# Patient Record
Sex: Female | Born: 1975 | Race: Black or African American | Hispanic: No | Smoking: Never smoker
Health system: Southern US, Community
[De-identification: ages and names within clinical notes are randomized; demographics above are authoritative.]

## PROBLEM LIST (undated history)

## (undated) DIAGNOSIS — K219 Gastro-esophageal reflux disease without esophagitis: Secondary | ICD-10-CM

## (undated) DIAGNOSIS — I10 Essential (primary) hypertension: Secondary | ICD-10-CM

## (undated) DIAGNOSIS — I82409 Acute embolism and thrombosis of unspecified deep veins of unspecified lower extremity: Secondary | ICD-10-CM

## (undated) DIAGNOSIS — E785 Hyperlipidemia, unspecified: Secondary | ICD-10-CM

## (undated) DIAGNOSIS — J45909 Unspecified asthma, uncomplicated: Secondary | ICD-10-CM

## (undated) DIAGNOSIS — M109 Gout, unspecified: Secondary | ICD-10-CM

## (undated) HISTORY — PX: TUBAL LIGATION: SHX77

## (undated) HISTORY — PX: BREAST SURGERY: SHX581

## (undated) HISTORY — PX: ABDOMINAL HYSTERECTOMY: SHX81

## (undated) HISTORY — PX: CHOLECYSTECTOMY: SHX55

## (undated) HISTORY — PX: KNEE SURGERY: SHX244

---

## 2011-05-11 ENCOUNTER — Emergency Department (HOSPITAL_BASED_OUTPATIENT_CLINIC_OR_DEPARTMENT_OTHER)
Admission: EM | Admit: 2011-05-11 | Discharge: 2011-05-11 | Disposition: A | Discharge status: Home / Self Care | Payer: Medicaid Other | Attending: Emergency Medicine | Admitting: Emergency Medicine

## 2011-05-11 ENCOUNTER — Encounter: Payer: Self-pay | Admitting: *Deleted

## 2011-05-11 ENCOUNTER — Emergency Department (INDEPENDENT_AMBULATORY_CARE_PROVIDER_SITE_OTHER): Payer: Medicaid Other

## 2011-05-11 DIAGNOSIS — R059 Cough, unspecified: Secondary | ICD-10-CM | POA: Insufficient documentation

## 2011-05-11 DIAGNOSIS — R05 Cough: Secondary | ICD-10-CM | POA: Insufficient documentation

## 2011-05-11 DIAGNOSIS — R079 Chest pain, unspecified: Secondary | ICD-10-CM | POA: Insufficient documentation

## 2011-05-11 DIAGNOSIS — I10 Essential (primary) hypertension: Secondary | ICD-10-CM | POA: Insufficient documentation

## 2011-05-11 DIAGNOSIS — J40 Bronchitis, not specified as acute or chronic: Secondary | ICD-10-CM | POA: Insufficient documentation

## 2011-05-11 HISTORY — DX: Essential (primary) hypertension: I10

## 2011-05-11 MED ORDER — HYDROCOD POLST-CHLORPHEN POLST 10-8 MG/5ML PO LQCR: 5.0000 mL | Freq: Two times a day (BID) | ORAL | Status: DC

## 2011-05-11 MED ORDER — HYDROCOD POLST-CHLORPHEN POLST 10-8 MG/5ML PO LQCR
5.0000 mL | Freq: Once | ORAL | Status: AC
  Administered 2011-05-11: 5 mL via ORAL
  Filled 2011-05-11: qty 5

## 2011-05-11 NOTE — ED Provider Notes (Addendum)
History     CSN: [id number] Arrival date & time: 05/11/2011  1:31 AM  Chief Complaint  Patient presents with  . Cough   HPI Complains of cough for one week cough is nonproductive no shortness of breath no fever developed chest pain left anterior worse with coughing yesterday . Treated with ibuprofen without relief  Past Medical History  Diagnosis Date  . Hypertension     Past Surgical History  Procedure Date  . Breast surgery   . Tubal ligation   . Cholecystectomy     History reviewed. No pertinent family history.  History  Substance Use Topics  . Smoking status: Never Smoker   . Smokeless tobacco: Not on file  . Alcohol Use: No    OB History    Grav Para Term Preterm Abortions TAB SAB Ect Mult Living                  Review of Systems  Constitutional: Negative.   HENT: Negative.   Respiratory: Positive for cough.   Cardiovascular: Positive for chest pain.       Chest pain with cough only  Gastrointestinal: Negative.   Musculoskeletal: Negative.   Skin: Negative.   Neurological: Negative.   Hematological: Negative.   Psychiatric/Behavioral: Negative.     Physical Exam  BP 125/91  Pulse 90  Temp(Src) 98.8 F (37.1 C) (Oral)  Resp 16  Wt 205 lb (92.987 kg)  LMP 05/10/2011  Physical Exam  Constitutional: She appears well-developed and well-nourished.  HENT:  Head: Normocephalic and atraumatic.  Eyes: Conjunctivae are normal. Pupils are equal, round, and reactive to light.  Neck: Neck supple. No tracheal deviation present. No thyromegaly present.  Cardiovascular: Normal rate and regular rhythm.   No murmur heard. Pulmonary/Chest: Effort normal and breath sounds normal.       Left chest wall tender, reproducing pain exactly  Abdominal: Soft. Bowel sounds are normal. She exhibits no distension. There is no tenderness.  Musculoskeletal: Normal range of motion. She exhibits no edema and no tenderness.  Neurological: She is alert. Coordination normal.    Skin: Skin is warm and dry. No rash noted.  Psychiatric: She has a normal mood and affect.    ED Course  Procedures  MDM Cough allergic versus viral no pneumonia on chest x-ray plan prescription Tussionex      Doug Sou, MD 05/11/11 [id number]  Doug Sou, MD 05/11/11 0400

## 2011-05-11 NOTE — ED Notes (Signed)
Pt c/o non pro cough x 1 week , with Shoulder pain and back pain with movt and cough

## 2015-12-09 ENCOUNTER — Encounter (HOSPITAL_BASED_OUTPATIENT_CLINIC_OR_DEPARTMENT_OTHER): Payer: Self-pay | Admitting: Emergency Medicine

## 2015-12-09 ENCOUNTER — Emergency Department (HOSPITAL_BASED_OUTPATIENT_CLINIC_OR_DEPARTMENT_OTHER)
Admission: EM | Admit: 2015-12-09 | Discharge: 2015-12-09 | Disposition: A | Discharge status: Home / Self Care | Payer: Managed Care, Other (non HMO) | Attending: Emergency Medicine | Admitting: Emergency Medicine

## 2015-12-09 DIAGNOSIS — Z79899 Other long term (current) drug therapy: Secondary | ICD-10-CM | POA: Diagnosis not present

## 2015-12-09 DIAGNOSIS — R51 Headache: Secondary | ICD-10-CM | POA: Diagnosis not present

## 2015-12-09 DIAGNOSIS — H9201 Otalgia, right ear: Secondary | ICD-10-CM | POA: Insufficient documentation

## 2015-12-09 DIAGNOSIS — R110 Nausea: Secondary | ICD-10-CM | POA: Insufficient documentation

## 2015-12-09 DIAGNOSIS — R197 Diarrhea, unspecified: Secondary | ICD-10-CM | POA: Diagnosis present

## 2015-12-09 DIAGNOSIS — Z7951 Long term (current) use of inhaled steroids: Secondary | ICD-10-CM | POA: Insufficient documentation

## 2015-12-09 DIAGNOSIS — I10 Essential (primary) hypertension: Secondary | ICD-10-CM | POA: Insufficient documentation

## 2015-12-09 MED ORDER — LOPERAMIDE HCL 2 MG PO CAPS: 2.0000 mg | ORAL_CAPSULE | Freq: Four times a day (QID) | ORAL | Status: DC | PRN

## 2015-12-09 NOTE — ED Provider Notes (Signed)
CSN: [id number]     Arrival date & time 12/09/15  1906 History  By signing my name below, I, Terrance Branch, attest that this documentation has been prepared under the direction and in the presence of Arby BarretteMarcy Yassir Enis, MD. Electronically Signed: Evon Slackerrance Branch, ED Scribe. 12/09/2015. 10:26 PM.      Chief Complaint  Patient presents with  . Diarrhea   The history is provided by the patient. No language interpreter was used.   HPI Comments: Marylene LandJoyce Bortner is a 40 y.o. female who presents to the Emergency Department complaining of diarrhea x11 onset 2 days prior. Pt also complain of nausea, HA and right ear pain. Pt does report hx of constipation. Pt states she has tried pepto bismol with no relief. Denies fever, abdominal pain, vomiting, hematuria, dysuria, light headedness or dizziness. Pt does report sick contacts at work. Pt denies recent antibiotic use. She denies recent healthcare exposure.   Past Medical History  Diagnosis Date  . Hypertension    Past Surgical History  Procedure Laterality Date  . Breast surgery    . Tubal ligation    . Cholecystectomy     History reviewed. No pertinent family history. Social History  Substance Use Topics  . Smoking status: Never Smoker   . Smokeless tobacco: None  . Alcohol Use: Yes     Comment: occ   OB History    No data available     Review of Systems  Constitutional: Negative for fever.  HENT: Positive for ear pain.   Gastrointestinal: Positive for nausea and diarrhea. Negative for vomiting and abdominal pain.  Genitourinary: Negative for dysuria and hematuria.  Neurological: Positive for headaches. Negative for dizziness and light-headedness.  All other systems reviewed and are negative.     Allergies  Review of patient's allergies indicates no known allergies.  Home Medications   Prior to Admission medications   Medication Sig Start Date End Date Taking? Authorizing Provider  albuterol (PROVENTIL HFA;VENTOLIN HFA) 108 (90  Base) MCG/ACT inhaler Inhale 2 puffs into the lungs every 6 (six) hours as needed for wheezing or shortness of breath.   Yes Historical Provider, MD  beclomethasone (QVAR) 40 MCG/ACT inhaler Inhale into the lungs 2 (two) times daily.   Yes Historical Provider, MD  cetirizine (ZYRTEC) 10 MG tablet Take 10 mg by mouth daily.   Yes Historical Provider, MD  chlorpheniramine-HYDROcodone (TUSSIONEX PENNKINETIC ER) 10-8 MG/5ML LQCR Take 5 mLs by mouth every 12 (twelve) hours. 05/11/11   Doug SouSam Jacubowitz, MD  loperamide (IMODIUM) 2 MG capsule Take 1 capsule (2 mg total) by mouth 4 (four) times daily as needed for diarrhea or loose stools. 12/09/15   Arby BarretteMarcy Lulamae Skorupski, MD  triamterene-hydrochlorothiazide (DYAZIDE) 37.5-25 MG per capsule Take 1 capsule by mouth every morning.      Historical Provider, MD   BP 135/90 mmHg  Pulse 82  Temp(Src) 98.4 F (36.9 C) (Oral)  Resp 18  Ht 5\' 6"  (1.676 m)  Wt 206 lb (93.441 kg)  BMI 33.27 kg/m2  SpO2 100%  LMP 11/15/2015   Physical Exam  Constitutional: She is oriented to person, place, and time. She appears well-developed and well-nourished. No distress.  HENT:  Head: Normocephalic and atraumatic.  Eyes: Conjunctivae and EOM are normal.  Neck: Neck supple. No tracheal deviation present.  Cardiovascular: Normal rate, regular rhythm and normal heart sounds.  Exam reveals no gallop and no friction rub.   No murmur heard. Pulmonary/Chest: Effort normal and breath sounds normal. No respiratory distress. She  has no wheezes. She has no rales.  Abdominal: Soft. She exhibits no distension. There is no tenderness. There is no rebound.  Musculoskeletal: Normal range of motion. She exhibits no edema or tenderness.  Neurological: She is alert and oriented to person, place, and time. She exhibits normal muscle tone. Coordination normal.  Skin: Skin is warm and dry.  Psychiatric: She has a normal mood and affect. Her behavior is normal.  Nursing note and vitals  reviewed.   ED Course  Procedures (including critical care time) DIAGNOSTIC STUDIES: Oxygen Saturation is 100% on RA, normal by my interpretation.    COORDINATION OF CARE: 10:26 PM-Discussed treatment plan with pt at bedside and pt agreed to plan.     Labs Review Labs Reviewed  URINALYSIS, ROUTINE W REFLEX MICROSCOPIC (NOT AT [facility])  PREGNANCY, URINE    Imaging Review No results found.    EKG Interpretation None      MDM   Final diagnoses:  Diarrhea, unspecified type   Patient has had one day of uncomplicated diarrheal illness. No fever no abdominal pain. No C. difficile risk factors. Vital signs are stable. Patient is counseled to use Imodium as needed. She is counseled on signs and symptoms for which return. At this time she is well in appearance and stable for symptomatic treatment of what is most likely a viral diarrheal illness. At this time, no diagnostic studies indicated.     Arby Barrette, MD 12/09/15 2239

## 2015-12-09 NOTE — ED Notes (Signed)
Per MD: discontinue all lab orders -- pt to be discharged.

## 2015-12-09 NOTE — Discharge Instructions (Signed)

## 2015-12-09 NOTE — ED Notes (Signed)
Patient reports that she has had nausea and diarrhea for 2 days. Reports that she is now having an ear ache

## 2016-03-09 ENCOUNTER — Emergency Department (HOSPITAL_BASED_OUTPATIENT_CLINIC_OR_DEPARTMENT_OTHER)
Admission: EM | Admit: 2016-03-09 | Discharge: 2016-03-10 | Disposition: A | Discharge status: Home / Self Care | Payer: Managed Care, Other (non HMO) | Attending: Emergency Medicine | Admitting: Emergency Medicine

## 2016-03-09 ENCOUNTER — Encounter (HOSPITAL_BASED_OUTPATIENT_CLINIC_OR_DEPARTMENT_OTHER): Payer: Self-pay | Admitting: *Deleted

## 2016-03-09 DIAGNOSIS — Z79899 Other long term (current) drug therapy: Secondary | ICD-10-CM | POA: Diagnosis not present

## 2016-03-09 DIAGNOSIS — I10 Essential (primary) hypertension: Secondary | ICD-10-CM | POA: Diagnosis not present

## 2016-03-09 DIAGNOSIS — R509 Fever, unspecified: Secondary | ICD-10-CM | POA: Diagnosis present

## 2016-03-09 DIAGNOSIS — J029 Acute pharyngitis, unspecified: Secondary | ICD-10-CM | POA: Diagnosis not present

## 2016-03-09 LAB — RAPID STREP SCREEN (MED CTR MEBANE ONLY): Streptococcus, Group A Screen (Direct): NEGATIVE

## 2016-03-09 MED ORDER — DEXAMETHASONE SODIUM PHOSPHATE 10 MG/ML IJ SOLN
10.0000 mg | Freq: Once | INTRAMUSCULAR | Status: AC
  Administered 2016-03-09: 10 mg via INTRAMUSCULAR
  Filled 2016-03-09: qty 1

## 2016-03-09 NOTE — ED Notes (Signed)
Sore throat, HA and ear pain x 3 days.  Fever.

## 2016-03-09 NOTE — ED Provider Notes (Signed)
CSN: [id number]     Arrival date & time 03/09/16  2217 History   By signing my name below, I, Terrance Branch, attest that this documentation has been prepared under the direction and in the presence of Shon Batonourtney F Horton, MD. Electronically Signed: Evon Slackerrance Branch, ED Scribe. 03/09/2016. 11:40 PM.    Chief Complaint  Patient presents with  . URI    The history is provided by the patient. No language interpreter was used.   HPI Comments: Veronica Odom is a 40 y.o. female who presents to the Emergency Department complaining of URI symptoms onset 3 days prior. Pt states she has had a fever (max temp 102.1 PTA yesterday )and states that her sore throat is worse when swallowing.  Pt doesn't report any medications PTA. Pt denies SOB or other related symptoms. Reports headache and ear pain. Denies neck stiffness. Reports that she is able to swallow but it hurts with swallowing.   Past Medical History  Diagnosis Date  . Hypertension    Past Surgical History  Procedure Laterality Date  . Breast surgery    . Tubal ligation    . Cholecystectomy    . Knee surgery     History reviewed. No pertinent family history. Social History  Substance Use Topics  . Smoking status: Never Smoker   . Smokeless tobacco: None  . Alcohol Use: Yes     Comment: occ   OB History    No data available     Review of Systems  Constitutional: Positive for fever.  HENT: Positive for congestion, ear pain and sore throat.   Respiratory: Negative for shortness of breath.   Neurological: Positive for headaches.     Allergies  Ivp dye; Latex; and Penicillins  Home Medications   Prior to Admission medications   Medication Sig Start Date End Date Taking? Authorizing Provider  amLODipine (NORVASC) 10 MG tablet Take 10 mg by mouth daily.   Yes Historical Provider, MD  clindamycin (CLEOCIN) 150 MG capsule Take 2 capsules (300 mg total) by mouth 3 (three) times daily. 03/10/16   Shon Batonourtney F Horton, MD   BP 146/106  mmHg  Pulse 111  Temp(Src) 98.7 F (37.1 C) (Oral)  Resp 18  Ht 5\' 6"  (1.676 m)  Wt 190 lb (86.183 kg)  BMI 30.68 kg/m2  SpO2 99%  LMP 03/08/2016   Physical Exam  Constitutional: She is oriented to person, place, and time. She appears well-developed and well-nourished. No distress.  HENT:  Head: Normocephalic and atraumatic.  Mouth/Throat: Oropharyngeal exudate present.  Bilateral TMs clear, bilateral symmetric tonsillar enlargement with tonsillar exudate noted, uvula midline  Neck: Neck supple.  Cardiovascular: Normal rate, regular rhythm and normal heart sounds.   Pulmonary/Chest: Effort normal. No respiratory distress. She has no wheezes.  Abdominal: Soft. There is no tenderness.  Lymphadenopathy:    She has cervical adenopathy.  Neurological: She is alert and oriented to person, place, and time.  Skin: Skin is warm and dry.  Psychiatric: She has a normal mood and affect.  Nursing note and vitals reviewed.   ED Course  Procedures (including critical care time) DIAGNOSTIC STUDIES: Oxygen Saturation is 99% on RA, normal by my interpretation.    COORDINATION OF CARE: 11:41 PM-Discussed treatment plan with pt at bedside and pt agreed to plan.     Labs Review Labs Reviewed  RAPID STREP SCREEN (NOT AT [facility])  CULTURE, GROUP A STREP [facility])    Imaging Review No results found.    EKG Interpretation  None      MDM   Final diagnoses:  Pharyngitis    Patient presents with fever, sore throat, headache. Nontoxic on exam. Afebrile here. She meets 3 out of 4 [facility] criteria.  While strep screen here is negative, will elect to treat given patient's symptoms. She is otherwise well-appearing. Supportive measures at home including ibuprofen. She was given one dose of Decadron given tonsillar swelling. This is symmetric and there is no indication at this time for deep space infection.  After history, exam, and medical workup I feel the patient has been appropriately  medically screened and is safe for discharge home. Pertinent diagnoses were discussed with the patient. Patient was given return precautions.   I personally performed the services described in this documentation, which was scribed in my presence. The recorded information has been reviewed and is accurate.      Shon Baton, MD 03/10/16 [phone]

## 2016-03-10 MED ORDER — CLINDAMYCIN HCL 150 MG PO CAPS: 300.0000 mg | ORAL_CAPSULE | Freq: Three times a day (TID) | ORAL | Status: DC

## 2016-03-10 NOTE — Discharge Instructions (Signed)

## 2016-03-12 LAB — CULTURE, GROUP A STREP (THRC)

## 2016-11-24 ENCOUNTER — Emergency Department (HOSPITAL_BASED_OUTPATIENT_CLINIC_OR_DEPARTMENT_OTHER)
Admission: EM | Admit: 2016-11-24 | Discharge: 2016-11-24 | Disposition: A | Discharge status: Home / Self Care | Payer: Managed Care, Other (non HMO) | Attending: Emergency Medicine | Admitting: Emergency Medicine

## 2016-11-24 ENCOUNTER — Emergency Department (HOSPITAL_BASED_OUTPATIENT_CLINIC_OR_DEPARTMENT_OTHER): Payer: Managed Care, Other (non HMO)

## 2016-11-24 ENCOUNTER — Encounter (HOSPITAL_BASED_OUTPATIENT_CLINIC_OR_DEPARTMENT_OTHER): Payer: Self-pay | Admitting: Emergency Medicine

## 2016-11-24 DIAGNOSIS — R51 Headache: Secondary | ICD-10-CM | POA: Diagnosis not present

## 2016-11-24 DIAGNOSIS — I10 Essential (primary) hypertension: Secondary | ICD-10-CM | POA: Insufficient documentation

## 2016-11-24 DIAGNOSIS — R0981 Nasal congestion: Secondary | ICD-10-CM | POA: Diagnosis not present

## 2016-11-24 DIAGNOSIS — J029 Acute pharyngitis, unspecified: Secondary | ICD-10-CM | POA: Insufficient documentation

## 2016-11-24 DIAGNOSIS — R05 Cough: Secondary | ICD-10-CM | POA: Insufficient documentation

## 2016-11-24 DIAGNOSIS — J3489 Other specified disorders of nose and nasal sinuses: Secondary | ICD-10-CM | POA: Diagnosis not present

## 2016-11-24 DIAGNOSIS — R110 Nausea: Secondary | ICD-10-CM | POA: Diagnosis not present

## 2016-11-24 DIAGNOSIS — R509 Fever, unspecified: Secondary | ICD-10-CM | POA: Insufficient documentation

## 2016-11-24 DIAGNOSIS — M791 Myalgia: Secondary | ICD-10-CM | POA: Diagnosis not present

## 2016-11-24 DIAGNOSIS — J45909 Unspecified asthma, uncomplicated: Secondary | ICD-10-CM | POA: Diagnosis not present

## 2016-11-24 DIAGNOSIS — J111 Influenza due to unidentified influenza virus with other respiratory manifestations: Secondary | ICD-10-CM

## 2016-11-24 DIAGNOSIS — R69 Illness, unspecified: Secondary | ICD-10-CM

## 2016-11-24 HISTORY — DX: Unspecified asthma, uncomplicated: J45.909

## 2016-11-24 MED ORDER — BENZONATATE 100 MG PO CAPS
100.0000 mg | ORAL_CAPSULE | Freq: Once | ORAL | Status: AC
  Administered 2016-11-24: 100 mg via ORAL
  Filled 2016-11-24: qty 1

## 2016-11-24 MED ORDER — PREDNISONE 50 MG PO TABS
60.0000 mg | ORAL_TABLET | Freq: Once | ORAL | Status: AC
  Administered 2016-11-24: 60 mg via ORAL
  Filled 2016-11-24: qty 1

## 2016-11-24 MED ORDER — PREDNISONE 20 MG PO TABS
40.0000 mg | ORAL_TABLET | Freq: Every day | ORAL | 0 refills | Status: DC
Start: 2016-11-24 — End: 2017-01-08

## 2016-11-24 MED ORDER — IPRATROPIUM-ALBUTEROL 0.5-2.5 (3) MG/3ML IN SOLN
3.0000 mL | Freq: Once | RESPIRATORY_TRACT | Status: AC
  Administered 2016-11-24: 3 mL via RESPIRATORY_TRACT
  Filled 2016-11-24: qty 3

## 2016-11-24 MED ORDER — GUAIFENESIN-CODEINE 100-10 MG/5ML PO SOLN: 5.0000 mL | Freq: Three times a day (TID) | ORAL | 0 refills | Status: DC | PRN

## 2016-11-24 MED ORDER — ALBUTEROL SULFATE (2.5 MG/3ML) 0.083% IN NEBU
2.5000 mg | INHALATION_SOLUTION | Freq: Once | RESPIRATORY_TRACT | Status: AC
  Administered 2016-11-24: 2.5 mg via RESPIRATORY_TRACT
  Filled 2016-11-24: qty 3

## 2016-11-24 NOTE — ED Notes (Signed)
RT steve assessing pt in triage.

## 2016-11-24 NOTE — ED Provider Notes (Signed)
[facility]   CSN: [id number] Arrival date & time: 11/24/16  2031  By signing my name below, I, Veronica Odom, attest that this documentation has been prepared under the direction and in the presence of Wilburn Mylar, PA-C Electronically Signed: Soijett Odom, ED Scribe. 11/24/16. 10:03 PM.  History   Chief Complaint Chief Complaint  Patient presents with  . Cough    HPI Veronica Odom is a 41 y.o. female with a PMHx of asthma, HTN, who presents to the Emergency Department complaining of productive cough x yellow sputum onset 3-4 days ago. Pt reports associated wheezing, nasal congestion, rhinorrhea, subjective fever, chills, sore throat, appetite change, HA, generalized body aches, chest wall tenderness due to cough, and nausea. Pt has tried promethazine, tylenol, ginger tea, and cough drops with no relief of her symptoms. Pt notes that she hasn't obtained her flu vaccination this past year. She reports that she has sick contacts at her place of employment. She denies vomiting, sob, diarrhea, leg swelling, calf pain, and any other symptoms. Pt denies history of dvt,  recent travel, immobilization, surgery, estrogen use, or birth control use. Pt states that she has had a tubal ligation.   The history is provided by the patient. No language interpreter was used.    Past Medical History:  Diagnosis Date  . Asthma   . Hypertension     There are no active problems to display for this patient.   Past Surgical History:  Procedure Laterality Date  . BREAST SURGERY    . CHOLECYSTECTOMY    . KNEE SURGERY    . TUBAL LIGATION      OB History    No data available       Home Medications    Prior to Admission medications   Medication Sig Start Date End Date Taking? Authorizing Provider  amLODipine (NORVASC) 10 MG tablet Take 10 mg by mouth daily.    Historical Provider, MD  clindamycin (CLEOCIN) 150 MG capsule Take 2 capsules (300 mg total) by mouth  3 (three) times daily. 03/10/16   Shon Baton, MD    Family History History reviewed. No pertinent family history.  Social History Social History  Substance Use Topics  . Smoking status: Never Smoker  . Smokeless tobacco: Not on file  . Alcohol use Yes     Comment: occ     Allergies   Ivp dye [iodinated diagnostic agents]; Latex; and Penicillins   Review of Systems Review of Systems  Constitutional: Positive for appetite change, chills and fever (subjective).  HENT: Positive for congestion, rhinorrhea and sore throat.   Respiratory: Positive for cough and wheezing.        +chest wall tenderness due to cough  Cardiovascular: Negative for leg swelling.  Gastrointestinal: Positive for nausea. Negative for diarrhea and vomiting.  Musculoskeletal: Positive for myalgias.  Neurological: Positive for headaches.     Physical Exam Updated Vital Signs BP 123/89 (BP Location: Right Arm)   Pulse 98   Temp 98.8 F (37.1 C) (Oral)   Resp 24   Ht 5\' 4"  (1.626 m)   Wt 87.5 kg   LMP 10/19/2016 (Exact Date)   SpO2 99%   BMI 33.13 kg/m   Physical Exam  Constitutional: She is oriented to person, place, and time. She appears well-developed and well-nourished. No distress.  Non-toxic appearing.  HENT:  Head: Normocephalic and atraumatic.  Right Ear: Tympanic membrane, external ear and ear canal normal.  Left Ear: Tympanic  membrane, external ear and ear canal normal.  Nose: Mucosal edema and rhinorrhea present.  Mouth/Throat: Uvula is midline and mucous membranes are normal. Posterior oropharyngeal erythema present. No oropharyngeal exudate, posterior oropharyngeal edema or tonsillar abscesses.  Eyes: Conjunctivae and EOM are normal.  Neck: Normal range of motion. Neck supple.  Cardiovascular: Normal rate, regular rhythm and normal heart sounds.  Exam reveals no gallop and no friction rub.   No murmur heard. Hr rate 95 on exam.  Pulmonary/Chest: Effort normal and breath  sounds normal. No respiratory distress. She has no wheezes. She has no rales.  CTAB. Productive cough noted. No hypoxia or tacnypnea.  Abdominal: Soft. Bowel sounds are normal. There is no tenderness.  Musculoskeletal: Normal range of motion.  No lower extremity edema, calf tenderness, or echymosis. Generalized myalgias.   Lymphadenopathy:    She has no cervical adenopathy.  Neurological: She is alert and oriented to person, place, and time.  Skin: Skin is warm and dry. Capillary refill takes less than 2 seconds.  Psychiatric: She has a normal mood and affect. Her behavior is normal.  Nursing note and vitals reviewed.    ED Treatments / Results  DIAGNOSTIC STUDIES: Oxygen Saturation is 99% on RA, nl by my interpretation.    COORDINATION OF CARE: 10:01 PM Discussed treatment plan with pt at bedside which includes breathing treatment, CXR, prednisone Rx, and pt agreed to plan.   Radiology Dg Chest 2 View  Result Date: 11/24/2016 CLINICAL DATA:  Fever, cough and congestion. Respiratory difficulty. EXAM: CHEST  2 VIEW COMPARISON:  09/17/2011 FINDINGS: The heart size and mediastinal contours are within normal limits. Both lungs are clear. The visualized skeletal structures are unremarkable. IMPRESSION: No active cardiopulmonary disease. Electronically Signed   By: Signa Kellaylor  Stroud M.D.   On: 11/24/2016 21:00    Procedures Procedures (including critical care time)  Medications Ordered in ED Medications  ipratropium-albuterol (DUONEB) 0.5-2.5 (3) MG/3ML nebulizer solution 3 mL (3 mLs Nebulization Given 11/24/16 2049)  albuterol (PROVENTIL) (2.5 MG/3ML) 0.083% nebulizer solution 2.5 mg (2.5 mg Nebulization Given 11/24/16 2049)  predniSONE (DELTASONE) tablet 60 mg (60 mg Oral Given 11/24/16 2236)  benzonatate (TESSALON) capsule 100 mg (100 mg Oral Given 11/24/16 2236)     Initial Impression / Assessment and Plan / ED Course  I have reviewed the triage vital signs and the nursing  notes.  Pertinent imaging results that were available during my care of the patient were reviewed by me and considered in my medical decision making (see chart for details).     Patient with symptoms consistent with influenza.  Vitals are stable. Mild signs of dehydration. HR slightly elevated in triage. Poor po intake today. Given 3 cups of water with improvement in hr.  Tolerating PO's.  Lungs are clear. CXR without any focal infiltrate.  Discussed the cost versus benefit of Tamiflu treatment with the patient. The patient understands that symptoms are greater than the recommended 24-48 hour window of treatment.  Patient will be discharged with instructions to orally hydrate, rest, and use over-the-counter medications such as anti-inflammatories ibuprofen and Aleve for muscle aches and Tylenol for fever.  Patient will also be given a cough suppressant. Pt is PERC positive due to hr in triage however she is low risk wells. No lower extremity edema. No risk factors for PE. Scattered wheezes in triage. Given duoneb. CTAB on my exam. Will treat with steroids and albuterol inhaler. She is not hypoxic or tachypenic. Pt is hemodynamically stable, in NAD, &  able to ambulate in the ED. Pain has been managed & has no complaints prior to dc. Pt is comfortable with above plan and is stable for discharge at this time. All questions were answered prior to disposition. Strict return precautions for f/u to the ED were discussed.   Final Clinical Impressions(s) / ED Diagnoses   Final diagnoses:  Influenza-like illness    New Prescriptions Discharge Medication List as of 11/24/2016 10:49 PM    START taking these medications   Details  guaiFENesin-codeine 100-10 MG/5ML syrup Take 5 mLs by mouth 3 (three) times daily as needed for cough., Starting Sun 11/24/2016, Print    predniSONE (DELTASONE) 20 MG tablet Take 2 tablets (40 mg total) by mouth daily with breakfast., Starting Sun 11/24/2016, Print       I  personally performed the services described in this documentation, which was scribed in my presence. The recorded information has been reviewed and is accurate.     Rise Mu, PA-C 11/25/16 1351    Benjiman Core, MD 11/28/16 [phone]

## 2016-11-24 NOTE — ED Triage Notes (Signed)
Pt reports fever, cough, congestion, sore throat, h/a x3 days.  Pt is coughing up yellow sputum and has bodyaches including pain in bilateral legs.  Pt alert and oriented. PT has faint wheezes upon auscultation.

## 2016-11-24 NOTE — Discharge Instructions (Signed)
Take the cough syrup as needed. Take the prednisone starting tomorrow 2 pills a day for 3 days. Continue using her albuterol inhaler. Use over-the-counter Mucinex. Make sure you drink plenty of fluids. Alternate Motrin and Tylenol.

## 2017-01-08 ENCOUNTER — Encounter (HOSPITAL_BASED_OUTPATIENT_CLINIC_OR_DEPARTMENT_OTHER): Payer: Self-pay | Admitting: *Deleted

## 2017-01-08 ENCOUNTER — Emergency Department (HOSPITAL_BASED_OUTPATIENT_CLINIC_OR_DEPARTMENT_OTHER)
Admission: EM | Admit: 2017-01-08 | Discharge: 2017-01-08 | Disposition: A | Discharge status: Home / Self Care | Payer: Managed Care, Other (non HMO) | Attending: Emergency Medicine | Admitting: Emergency Medicine

## 2017-01-08 DIAGNOSIS — B349 Viral infection, unspecified: Secondary | ICD-10-CM | POA: Diagnosis not present

## 2017-01-08 DIAGNOSIS — I10 Essential (primary) hypertension: Secondary | ICD-10-CM | POA: Insufficient documentation

## 2017-01-08 DIAGNOSIS — M545 Low back pain, unspecified: Secondary | ICD-10-CM

## 2017-01-08 DIAGNOSIS — J45909 Unspecified asthma, uncomplicated: Secondary | ICD-10-CM | POA: Insufficient documentation

## 2017-01-08 LAB — COMPREHENSIVE METABOLIC PANEL
ALT: 20 U/L (ref 14–54)
AST: 22 U/L (ref 15–41)
Albumin: 3.8 g/dL (ref 3.5–5.0)
Alkaline Phosphatase: 62 U/L (ref 38–126)
Anion gap: 8 (ref 5–15)
BUN: 15 mg/dL (ref 6–20)
CHLORIDE: 103 mmol/L (ref 101–111)
CO2: 22 mmol/L (ref 22–32)
CREATININE: 0.83 mg/dL (ref 0.44–1.00)
Calcium: 8.9 mg/dL (ref 8.9–10.3)
GFR calc non Af Amer: >60 mL/min (ref >60)
GLUCOSE: 101 mg/dL — AB (ref 65–99)
Potassium: 3.5 mmol/L (ref 3.5–5.1)
SODIUM: 133 mmol/L — AB (ref 135–145)
Total Bilirubin: 0.5 mg/dL (ref 0.3–1.2)
Total Protein: 7.5 g/dL (ref 6.5–8.1)

## 2017-01-08 LAB — CBC
HCT: 34.7 % — ABNORMAL LOW (ref 36.0–46.0)
Hemoglobin: 11.7 g/dL — ABNORMAL LOW (ref 12.0–15.0)
MCH: 31.9 pg (ref 26.0–34.0)
MCHC: 33.7 g/dL (ref 30.0–36.0)
MCV: 94.6 fL (ref 78.0–100.0)
PLATELETS: 206 10*3/uL (ref 150–400)
RBC: 3.67 MIL/uL — AB (ref 3.87–5.11)
RDW: 11.9 % (ref 11.5–15.5)
WBC: 7.7 10*3/uL (ref 4.0–10.5)

## 2017-01-08 LAB — URINALYSIS, ROUTINE W REFLEX MICROSCOPIC
BILIRUBIN URINE: NEGATIVE
GLUCOSE, UA: NEGATIVE mg/dL
KETONES UR: NEGATIVE mg/dL
LEUKOCYTES UA: NEGATIVE
Nitrite: NEGATIVE
PH: 5.5 (ref 5.0–8.0)
PROTEIN: 30 mg/dL — AB
SPECIFIC GRAVITY, URINE: 1.028 (ref 1.005–1.030)

## 2017-01-08 LAB — PREGNANCY, URINE: Preg Test, Ur: NEGATIVE

## 2017-01-08 LAB — URINALYSIS, MICROSCOPIC (REFLEX): WBC UA: NONE SEEN WBC/hpf (ref 0–5)

## 2017-01-08 LAB — LIPASE, BLOOD: LIPASE: 40 U/L (ref 11–51)

## 2017-01-08 MED ORDER — OXYCODONE-ACETAMINOPHEN 5-325 MG PO TABS
1.0000 | ORAL_TABLET | Freq: Once | ORAL | Status: AC
  Administered 2017-01-08: 1 via ORAL
  Filled 2017-01-08: qty 1

## 2017-01-08 MED ORDER — KETOROLAC TROMETHAMINE 60 MG/2ML IM SOLN
60.0000 mg | Freq: Once | INTRAMUSCULAR | Status: AC
  Administered 2017-01-08: 60 mg via INTRAMUSCULAR
  Filled 2017-01-08: qty 2

## 2017-01-08 MED ORDER — IBUPROFEN 800 MG PO TABS
800.0000 mg | ORAL_TABLET | Freq: Once | ORAL | Status: DC
  Filled 2017-01-08: qty 1

## 2017-01-08 MED ORDER — NAPROXEN 500 MG PO TABS: 500.0000 mg | ORAL_TABLET | Freq: Two times a day (BID) | ORAL | 0 refills | Status: DC

## 2017-01-08 MED ORDER — METHOCARBAMOL 500 MG PO TABS: 500.0000 mg | ORAL_TABLET | Freq: Two times a day (BID) | ORAL | 0 refills | Status: DC

## 2017-01-08 NOTE — ED Notes (Signed)
Pt reports fever at home TMax 101.8 at 17:30. Pt reports last taking APAP  at 1500.

## 2017-01-08 NOTE — ED Provider Notes (Signed)
[facility] Provider Note   CSN: [id number] Arrival date & time: 01/08/17  2006  By signing my name below, I, Doreatha Martin, attest that this documentation has been prepared under the direction and in the presence of Jerelyn Scott, MD. Electronically Signed: Doreatha Martin, ED Scribe. 01/08/17. 11:40 PM.     History   Chief Complaint Chief Complaint  Patient presents with  . Back Pain    HPI Veronica Odom is a 41 y.o. female who presents to the Emergency Department complaining of moderate, gradually worsening, generalized body aches that began today. Pt reports associated generalized fatigue, HA and fever. She also complains of lower back pain that began yesterday, and is worsened with movement and in certain positions. No recent falls, trauma or injury. She has not tried anything for the back pain, but has taken Tylenol with some relief of her fever. Pt also reports some recent mild coughing, which she attributes to her asthma and allergies, and states this is not worsened with her current symptoms. Pt reports recent sick contact with her daughter, who was febrile and recently admitted to the hospital. She denies vomiting, dysuria or rash.      The history is provided by the patient. No language interpreter was used.  Back Pain   This is a new problem. The current episode started yesterday. The problem occurs constantly. The problem has been gradually worsening. The pain is associated with no known injury. The pain is present in the lumbar spine. The quality of the pain is described as aching. The pain does not radiate. The pain is moderate. The symptoms are aggravated by bending and certain positions. Associated symptoms include a fever, headaches and abdominal pain. Pertinent negatives include no dysuria and no weakness. She has tried nothing for the symptoms. The treatment provided no relief.    Past Medical History:  Diagnosis Date  . Asthma   . Hypertension     There are  no active problems to display for this patient.   Past Surgical History:  Procedure Laterality Date  . BREAST SURGERY    . CHOLECYSTECTOMY    . KNEE SURGERY    . TUBAL LIGATION      OB History    No data available       Home Medications    Prior to Admission medications   Medication Sig Start Date End Date Taking? Authorizing Provider  acetaminophen (TYLENOL) 500 MG chewable tablet Chew 1,000 mg by mouth every 6 (six) hours as needed for pain.   Yes Historical Provider, MD  amLODipine (NORVASC) 10 MG tablet Take 10 mg by mouth daily.    Historical Provider, MD  methocarbamol (ROBAXIN) 500 MG tablet Take 1 tablet (500 mg total) by mouth 2 (two) times daily. 01/08/17   Jerelyn Scott, MD  naproxen (NAPROSYN) 500 MG tablet Take 1 tablet (500 mg total) by mouth 2 (two) times daily. 01/08/17   Jerelyn Scott, MD    Family History History reviewed. No pertinent family history.  Social History Social History  Substance Use Topics  . Smoking status: Never Smoker  . Smokeless tobacco: Not on file  . Alcohol use Yes     Comment: occ     Allergies   Ivp dye [iodinated diagnostic agents]; Latex; and Penicillins   Review of Systems Review of Systems  Constitutional: Positive for fatigue and fever.  Respiratory: Positive for cough.   Gastrointestinal: Positive for abdominal pain.  Genitourinary: Negative for dysuria.  Musculoskeletal: Positive for  back pain.  Neurological: Positive for headaches. Negative for weakness.  All other systems reviewed and are negative.    Physical Exam Updated Vital Signs BP 109/81 (BP Location: Right Arm)   Pulse 96   Temp 98.9 F (37.2 C) (Oral)   Resp 16   Ht  (1.676 m)   Wt 197 lb (89.4 kg)   LMP 12/17/2016   SpO2 99%   BMI 31.80 kg/m  Vitals reviewed Physical Exam Physical Examination: General appearance - alert, well appearing, and in no distress Mental status - alert, oriented to person, place, and time Eyes -  No  conjunctival injection, no scleral icterus Mouth - mucous membranes moist, pharynx normal without lesions Neck - supple, no significant adenopathy Abdomen - soft, nontender, nondistended, no masses or organomegaly Back exam - no midline tenderness to palpation, bilateral lumbar paraspinal tenderness to palpation Neurological - alert, oriented, normal speech, strength 5/5 in extremities x 4, sensation intact Musculoskeletal - no joint tenderness, deformity or swelling Extremities - peripheral pulses normal, no pedal edema, no clubbing or cyanosis Skin - normal coloration and turgor, no rashes,   ED Treatments / Results   DIAGNOSTIC STUDIES: Oxygen Saturation is 99% on RA, normal by my interpretation.    COORDINATION OF CARE: 11:00 PM Discussed treatment plan with pt at bedside which includes labs and pt agreed to plan.    Labs (all labs ordered are listed, but only abnormal results are displayed) Labs Reviewed  URINALYSIS, ROUTINE W REFLEX MICROSCOPIC - Abnormal; Notable for the following:       Result Value   Hgb urine dipstick TRACE (*)    Protein, ur 30 (*)    All other components within normal limits  URINALYSIS, MICROSCOPIC (REFLEX) - Abnormal; Notable for the following:    Bacteria, UA RARE (*)    Squamous Epithelial / LPF 0-5 (*)    All other components within normal limits  COMPREHENSIVE METABOLIC PANEL - Abnormal; Notable for the following:    Sodium 133 (*)    Glucose, Bld 101 (*)    All other components within normal limits  CBC - Abnormal; Notable for the following:    RBC 3.67 (*)    Hemoglobin 11.7 (*)    HCT 34.7 (*)    All other components within normal limits  PREGNANCY, URINE  LIPASE, BLOOD    EKG  EKG Interpretation None       Radiology No results found.  Procedures Procedures (including critical care time)  Medications Ordered in ED Medications  oxyCODONE-acetaminophen (PERCOCET/ROXICET) 5-325 MG per tablet 1 tablet (1 tablet Oral Given  01/08/17 2253)  ketorolac (TORADOL) injection 60 mg (60 mg Intramuscular Given 01/08/17 2301)     Initial Impression / Assessment and Plan / ED Course  I have reviewed the triage vital signs and the nursing notes.  Pertinent labs & imaging results that were available during my care of the patient were reviewed by me and considered in my medical decision making (see chart for details).     Pt presenting with c/o fever, generalized body aches, as well as low back pain which began yesterday- pt does not have signs findings of UTI/pyelo.  Lungs CTA, doubt pneumonia.  Exam is most c/w muscular back pain, doubt epidural abscess or other acute emergent diagnosis at this time.  Denies IVDA.  Pt treated with toradol.  Vitals improved.  Discharged with strict return precautions.  Pt agreeable with plan.  Final Clinical Impressions(s) / ED Diagnoses  Final diagnoses:  Acute bilateral low back pain without sciatica  Viral infection    New Prescriptions New Prescriptions   METHOCARBAMOL (ROBAXIN) 500 MG TABLET    Take 1 tablet (500 mg total) by mouth 2 (two) times daily.   NAPROXEN (NAPROSYN) 500 MG TABLET    Take 1 tablet (500 mg total) by mouth 2 (two) times daily.    I personally performed the services described in this documentation, which was scribed in my presence. The recorded information has been reviewed and is accurate.     Jerelyn Scott, MD 01/09/17 [phone]

## 2017-01-08 NOTE — Discharge Instructions (Signed)
Return to the ED with any concerns including weakness of legs, not able to urinate, loss of control of bowel of urine or bowels, difficulty breathing, vomiting and not able to keep down liquids, decreased level of alertness/lethargy, or any other alarming symptoms

## 2017-01-08 NOTE — ED Triage Notes (Signed)
Pt c/o lower back pain , fever generalized body aches, Sob and cough x 6 hrs

## 2017-12-28 ENCOUNTER — Inpatient Hospital Stay (HOSPITAL_BASED_OUTPATIENT_CLINIC_OR_DEPARTMENT_OTHER)
Admission: EM | Admit: 2017-12-28 | Discharge: 2017-12-30 | DRG: 149 | Disposition: A | Discharge status: Home / Self Care | Payer: No Typology Code available for payment source | Attending: Student in an Organized Health Care Education/Training Program | Admitting: Student in an Organized Health Care Education/Training Program

## 2017-12-28 ENCOUNTER — Encounter (HOSPITAL_BASED_OUTPATIENT_CLINIC_OR_DEPARTMENT_OTHER): Payer: Self-pay | Admitting: Emergency Medicine

## 2017-12-28 ENCOUNTER — Emergency Department (HOSPITAL_COMMUNITY): Payer: No Typology Code available for payment source

## 2017-12-28 ENCOUNTER — Other Ambulatory Visit: Payer: Self-pay

## 2017-12-28 ENCOUNTER — Emergency Department (HOSPITAL_BASED_OUTPATIENT_CLINIC_OR_DEPARTMENT_OTHER): Payer: No Typology Code available for payment source

## 2017-12-28 DIAGNOSIS — Z79899 Other long term (current) drug therapy: Secondary | ICD-10-CM

## 2017-12-28 DIAGNOSIS — H8190 Unspecified disorder of vestibular function, unspecified ear: Secondary | ICD-10-CM | POA: Diagnosis not present

## 2017-12-28 DIAGNOSIS — R402412 Glasgow coma scale score 13-15, at arrival to emergency department: Secondary | ICD-10-CM | POA: Diagnosis present

## 2017-12-28 DIAGNOSIS — Z8249 Family history of ischemic heart disease and other diseases of the circulatory system: Secondary | ICD-10-CM | POA: Diagnosis not present

## 2017-12-28 DIAGNOSIS — R40241 Glasgow coma scale score 13-15, unspecified time: Secondary | ICD-10-CM

## 2017-12-28 DIAGNOSIS — Z800 Family history of malignant neoplasm of digestive organs: Secondary | ICD-10-CM

## 2017-12-28 DIAGNOSIS — R519 Headache, unspecified: Secondary | ICD-10-CM

## 2017-12-28 DIAGNOSIS — Z91013 Allergy to seafood: Secondary | ICD-10-CM

## 2017-12-28 DIAGNOSIS — I10 Essential (primary) hypertension: Secondary | ICD-10-CM | POA: Diagnosis present

## 2017-12-28 DIAGNOSIS — H8111 Benign paroxysmal vertigo, right ear: Secondary | ICD-10-CM | POA: Diagnosis present

## 2017-12-28 DIAGNOSIS — H538 Other visual disturbances: Secondary | ICD-10-CM

## 2017-12-28 DIAGNOSIS — Z91040 Latex allergy status: Secondary | ICD-10-CM

## 2017-12-28 DIAGNOSIS — E785 Hyperlipidemia, unspecified: Secondary | ICD-10-CM | POA: Diagnosis present

## 2017-12-28 DIAGNOSIS — Z880 Allergy status to penicillin: Secondary | ICD-10-CM

## 2017-12-28 DIAGNOSIS — Z91041 Radiographic dye allergy status: Secondary | ICD-10-CM

## 2017-12-28 DIAGNOSIS — R51 Headache: Secondary | ICD-10-CM

## 2017-12-28 DIAGNOSIS — H5509 Other forms of nystagmus: Secondary | ICD-10-CM

## 2017-12-28 DIAGNOSIS — H8122 Vestibular neuronitis, left ear: Principal | ICD-10-CM | POA: Insufficient documentation

## 2017-12-28 DIAGNOSIS — Z801 Family history of malignant neoplasm of trachea, bronchus and lung: Secondary | ICD-10-CM

## 2017-12-28 DIAGNOSIS — Z8042 Family history of malignant neoplasm of prostate: Secondary | ICD-10-CM

## 2017-12-28 DIAGNOSIS — M6281 Muscle weakness (generalized): Secondary | ICD-10-CM

## 2017-12-28 DIAGNOSIS — J45909 Unspecified asthma, uncomplicated: Secondary | ICD-10-CM | POA: Diagnosis present

## 2017-12-28 DIAGNOSIS — R42 Dizziness and giddiness: Secondary | ICD-10-CM

## 2017-12-28 LAB — CREATININE, SERUM
Creatinine, Ser: 0.83 mg/dL (ref 0.44–1.00)
GFR calc Af Amer: >60 mL/min (ref >60)
GFR calc non Af Amer: >60 mL/min (ref >60)

## 2017-12-28 LAB — COMPREHENSIVE METABOLIC PANEL
ALK PHOS: 76 U/L (ref 38–126)
ALT: 24 U/L (ref 14–54)
AST: 27 U/L (ref 15–41)
Albumin: 4.4 g/dL (ref 3.5–5.0)
Anion gap: 11 (ref 5–15)
BUN: 11 mg/dL (ref 6–20)
CALCIUM: 9.4 mg/dL (ref 8.9–10.3)
CO2: 22 mmol/L (ref 22–32)
CREATININE: 0.72 mg/dL (ref 0.44–1.00)
Chloride: 105 mmol/L (ref 101–111)
Glucose, Bld: 115 mg/dL — ABNORMAL HIGH (ref 65–99)
Potassium: 3.7 mmol/L (ref 3.5–5.1)
Sodium: 138 mmol/L (ref 135–145)
Total Bilirubin: 0.6 mg/dL (ref 0.3–1.2)
Total Protein: 8.8 g/dL — ABNORMAL HIGH (ref 6.5–8.1)

## 2017-12-28 LAB — CBC
HCT: 38.6 % (ref 36.0–46.0)
HEMATOCRIT: 38.8 % (ref 36.0–46.0)
HEMOGLOBIN: 13 g/dL (ref 12.0–15.0)
Hemoglobin: 12.6 g/dL (ref 12.0–15.0)
MCH: 32 pg (ref 26.0–34.0)
MCH: 31.3 pg (ref 26.0–34.0)
MCHC: 33.7 g/dL (ref 30.0–36.0)
MCHC: 32.5 g/dL (ref 30.0–36.0)
MCV: 95.1 fL (ref 78.0–100.0)
MCV: 96.3 fL (ref 78.0–100.0)
PLATELETS: 255 10*3/uL (ref 150–400)
Platelets: 249 10*3/uL (ref 150–400)
RBC: 4.06 MIL/uL (ref 3.87–5.11)
RBC: 4.03 MIL/uL (ref 3.87–5.11)
RDW: 11.5 % (ref 11.5–15.5)
RDW: 12.2 % (ref 11.5–15.5)
WBC: 4.9 10*3/uL (ref 4.0–10.5)
WBC: 5.3 10*3/uL (ref 4.0–10.5)

## 2017-12-28 LAB — DIFFERENTIAL
BASOS ABS: 0 10*3/uL (ref 0.0–0.1)
Basophils Relative: 0 %
Eosinophils Absolute: 0.1 10*3/uL (ref 0.0–0.7)
Eosinophils Relative: 1 %
LYMPHS PCT: 29 %
Lymphs Abs: 1.4 10*3/uL (ref 0.7–4.0)
MONO ABS: 0.4 10*3/uL (ref 0.1–1.0)
MONOS PCT: 7 %
NEUTROS ABS: 3.1 10*3/uL (ref 1.7–7.7)
Neutrophils Relative %: 63 %

## 2017-12-28 LAB — CBG MONITORING, ED: Glucose-Capillary: 116 mg/dL — ABNORMAL HIGH (ref 65–99)

## 2017-12-28 LAB — I-STAT BETA HCG BLOOD, ED (MC, WL, AP ONLY): I-stat hCG, quantitative: <5 m[IU]/mL (ref <5)

## 2017-12-28 LAB — TROPONIN I

## 2017-12-28 MED ORDER — ASPIRIN 325 MG PO TABS
325.0000 mg | ORAL_TABLET | Freq: Once | ORAL | Status: AC
Start: 2017-12-28 — End: 2017-12-28
  Administered 2017-12-28: 325 mg via ORAL
  Filled 2017-12-28: qty 1

## 2017-12-28 MED ORDER — SODIUM CHLORIDE 0.9 % IV BOLUS: 1000.0000 mL | Freq: Once | INTRAVENOUS | Status: DC

## 2017-12-28 MED ORDER — FLUTICASONE PROPIONATE 50 MCG/ACT NA SUSP
1.0000 | Freq: Every day | NASAL | Status: DC
  Filled 2017-12-28 (×2): qty 16

## 2017-12-28 MED ORDER — ENOXAPARIN SODIUM 40 MG/0.4ML ~~LOC~~ SOLN
40.0000 mg | SUBCUTANEOUS | Status: DC
  Filled 2017-12-28 (×2): qty 0.4

## 2017-12-28 MED ORDER — MECLIZINE HCL 25 MG PO TABS
25.0000 mg | ORAL_TABLET | Freq: Once | ORAL | Status: AC
  Administered 2017-12-28: 25 mg via ORAL
  Filled 2017-12-28: qty 1

## 2017-12-28 MED ORDER — GADOBENATE DIMEGLUMINE 529 MG/ML IV SOLN
15.0000 mL | Freq: Once | INTRAVENOUS | Status: AC
  Administered 2017-12-28: 15 mL via INTRAVENOUS

## 2017-12-28 MED ORDER — ACETAMINOPHEN 500 MG PO TABS: 1000.0000 mg | ORAL_TABLET | Freq: Four times a day (QID) | ORAL | Status: DC | PRN

## 2017-12-28 MED ORDER — AMLODIPINE BESYLATE 10 MG PO TABS: 10.0000 mg | ORAL_TABLET | Freq: Every day | ORAL | Status: DC

## 2017-12-28 MED ORDER — ALBUTEROL SULFATE (2.5 MG/3ML) 0.083% IN NEBU: 2.5000 mg | INHALATION_SOLUTION | Freq: Every day | RESPIRATORY_TRACT | Status: DC | PRN

## 2017-12-28 MED ORDER — LORAZEPAM 2 MG/ML IJ SOLN
1.0000 mg | Freq: Once | INTRAMUSCULAR | Status: AC
  Administered 2017-12-28: 1 mg via INTRAVENOUS
  Filled 2017-12-28: qty 1

## 2017-12-28 MED ORDER — BUDESONIDE 0.25 MG/2ML IN SUSP
0.2500 mg | Freq: Two times a day (BID) | RESPIRATORY_TRACT | Status: DC
  Administered 2017-12-28 – 2017-12-29 (×3): 0.25 mg via RESPIRATORY_TRACT
  Filled 2017-12-28 (×4): qty 2

## 2017-12-28 NOTE — ED Provider Notes (Signed)
[facility] EMERGENCY DEPARTMENT Provider Note   CSN: [id number] Arrival date & time: 12/28/17  7425     History   Chief Complaint Chief Complaint  Patient presents with  . Dizziness    HPI Veronica Odom is a 42 y.o. female.  The history is provided by the patient. No language interpreter was used.  Dizziness  Quality:  Head spinning and room spinning Severity:  Severe Onset quality:  Sudden Duration:  2 hours (at 7:15 AM) Timing:  Constant Progression:  Unchanged Chronicity:  New Context: head movement   Relieved by:  Being still Worsened by:  Nothing Ineffective treatments:  None tried Associated symptoms: headaches, nausea, vision changes, vomiting and weakness   Associated symptoms: no chest pain, no diarrhea, no palpitations, no shortness of breath and no tinnitus   Headaches:    Severity:  Moderate   Timing:  Constant Risk factors: no hx of stroke   Risk factors comment:  HTN   Past Medical History:  Diagnosis Date  . Asthma   . Hypertension     There are no active problems to display for this patient.   Past Surgical History:  Procedure Laterality Date  . BREAST SURGERY    . CHOLECYSTECTOMY    . KNEE SURGERY    . TUBAL LIGATION       OB History   None      Home Medications    Prior to Admission medications   Medication Sig Start Date End Date Taking? Authorizing Provider  acetaminophen (TYLENOL) 500 MG chewable tablet Chew 1,000 mg by mouth every 6 (six) hours as needed for pain.    [provider]  amLODipine (NORVASC) 10 MG tablet Take 10 mg by mouth daily.    [provider]  methocarbamol (ROBAXIN) 500 MG tablet Take 1 tablet (500 mg total) by mouth 2 (two) times daily. 01/08/17   Mabe, Latanya Maudlin, MD  naproxen (NAPROSYN) 500 MG tablet Take 1 tablet (500 mg total) by mouth 2 (two) times daily. 01/08/17   Mabe, Latanya Maudlin, MD    Family History No family history on file.  Social History Social History    Tobacco Use  . Smoking status: Never Smoker  . Smokeless tobacco: Never Used  Substance Use Topics  . Alcohol use: Yes    Comment: occ  . Drug use: No     Allergies   Ivp dye [iodinated diagnostic agents]; Latex; and Penicillins   Review of Systems Review of Systems  Constitutional: Negative for chills, diaphoresis, fatigue and fever.  HENT: Negative for congestion and tinnitus.   Eyes: Positive for visual disturbance.  Respiratory: Negative for cough, chest tightness, shortness of breath and wheezing.   Cardiovascular: Negative for chest pain and palpitations.  Gastrointestinal: Positive for nausea and vomiting. Negative for abdominal pain, constipation and diarrhea.  Genitourinary: Negative for flank pain.  Musculoskeletal: Negative for back pain, neck pain and neck stiffness.  Skin: Negative for rash and wound.  Neurological: Positive for dizziness, weakness and headaches. Negative for seizures, syncope, facial asymmetry, speech difficulty, light-headedness and numbness.  Psychiatric/Behavioral: Negative for agitation.  All other systems reviewed and are negative.    Physical Exam Updated Vital Signs BP 127/88 (BP Location: Right Arm)   Pulse 90   Temp 98.1 F (36.7 C) (Oral)   Resp 18   Wt 94.4 kg (208 lb 1.8 oz)   LMP 12/24/2017   SpO2 100%   BMI 33.59 kg/m   Physical Exam  Constitutional: She is oriented to person, place, and time. She appears well-developed and well-nourished. No distress.  HENT:  Head: Normocephalic.  Nose: Nose normal.  Mouth/Throat: Oropharynx is clear and moist. No oropharyngeal exudate.  Eyes: Pupils are equal, round, and reactive to light. Conjunctivae and EOM are normal.  Neck: Normal range of motion.  Cardiovascular: Normal rate and intact distal pulses.  No murmur heard. Pulmonary/Chest: Effort normal and breath sounds normal. No stridor. No respiratory distress. She has no wheezes. She has no rales. She exhibits no  tenderness.  Abdominal: Soft. Bowel sounds are normal. She exhibits no distension. There is no tenderness.  Musculoskeletal: She exhibits no edema or tenderness.  Neurological: She is alert and oriented to person, place, and time. She displays normal reflexes. No cranial nerve deficit or sensory deficit. She exhibits normal muscle tone. Coordination normal.  Skin: Capillary refill takes less than 2 seconds. No rash noted. She is not diaphoretic. No erythema.  Psychiatric: She has a normal mood and affect.  Nursing note and vitals reviewed.    ED Treatments / Results  Labs (all labs ordered are listed, but only abnormal results are displayed) Labs Reviewed  COMPREHENSIVE METABOLIC PANEL - Abnormal; Notable for the following components:      Result Value   Glucose, Bld 115 (*)    Total Protein 8.8 (*)    All other components within normal limits  CBG MONITORING, ED - Abnormal; Notable for the following components:   Glucose-Capillary 116 (*)    All other components within normal limits  CBC  DIFFERENTIAL  TROPONIN I  PREGNANCY, URINE  APTT  PROTIME-INR  CBG MONITORING, ED    EKG EKG Interpretation  Date/Time:  Sunday December 28 2017 09:21:40 EDT Ventricular Rate:  88 PR Interval:    QRS Duration: 86 QT Interval:  352 QTC Calculation: 426 R Axis:   2 Text Interpretation:  Sinus rhythm Abnormal R-wave progression, early transition Borderline T abnormalities, anterior leads Baseline wander in lead(s) I III aVR aVL aVF V6 No prior ECG for comparison.  No STEMI Confirmed by Theda Belfastegeler, Chris ([id number]) on 12/28/2017 9:24:19 AM   Radiology Ct Head Code Stroke Wo Contrast`  Result Date: 12/28/2017 CLINICAL DATA:  Code stroke. Sudden onset dizziness with vomiting and blurred vision. Weakness in the right leg EXAM: CT HEAD WITHOUT CONTRAST TECHNIQUE: Contiguous axial images were obtained from the base of the skull through the vertex without intravenous contrast. COMPARISON:  None available  FINDINGS: Brain: No evidence of acute infarction, hemorrhage, hydrocephalus, extra-axial collection or mass lesion/mass effect. Mildly low cerebellar tonsils projecting 3-4 mm below the foramen magnum. No pointing or high-grade foramen magnum stenosis to imply Chiari 1 malformation. Cervical MRI in 2011 with no syrinx. Vascular: No hyperdense vessel or unexpected calcification. Skull: Normal. Negative for fracture or focal lesion. Sinuses/Orbits: No acute finding. Other: These results were called by telephone at the time of interpretation on 12/28/2017 at 9:25 am to Dr. Lynden OxfordHRISTOPHER TEGELER , who verbally acknowledged these results. ASPECTS [facility]([place] Stroke Program Early CT Score) - Ganglionic level infarction (caudate, lentiform nuclei, internal capsule, insula, M1-M3 cortex): 7 - Supraganglionic infarction (M4-M6 cortex): 3 Total score (0-10 with 10 being normal): 10 IMPRESSION: No acute finding. ASPECTS is 10. Electronically Signed   By: Marnee SpringJonathon  Watts M.D.   On: 12/28/2017 09:26    Procedures Procedures (including critical care time)  Medications Ordered in ED Medications  sodium chloride 0.9 % bolus 1,000 mL (has no administration in time range)  meclizine (ANTIVERT) tablet 25 mg (25 mg Oral Given 12/28/17 1003)     Initial Impression / Assessment and Plan / ED Course  I have reviewed the triage vital signs and the nursing notes.  Pertinent labs & imaging results that were available during my care of the patient were reviewed by me and considered in my medical decision making (see chart for details).     Veronica Odom is a 42 y.o. female with a past medical history of hypertension who presents for multiple complaints including sudden onset of room spinning dizziness, blurry vision bilaterally, and right leg weakness.  Patient was made a code stroke on arrival in triage as she arrived by personal vehicle.  Patient quickly taken to CT scanner and she was evaluated by me as she was leaving the  scanner.  Patient reports that she was at work at 7:15 AM she had sudden onset of blurry vision bilaterally and room spinning dizziness.  She reports that it was worsened with head movement and sitting up.  She is never had this before and no history of vertigo.  She reports that she also noticed her right leg was weak compared to the left which is new.  She denies recent trauma.  She denies any recent fevers, chills, neck pain, neck stiffness, chest pain, palpitations, shortness of breath, urinary symptoms or GI symptoms.  She does report she had nausea and vomiting with the room spinning she experienced.  EKG showed no STEMI.  Radiology called report that her CT scan was negative for stroke.  Tele-neurology of assessed the patient and did not recommend TPA.  They recommended patient be transferred to [facility] for MRI with and without contrast of the brain to look for stroke versus a demyelinating lesion like MS given her age.  They also recommended MRI of the head and neck as the symptoms of vision changes and dizziness can be associated with a peripheral circulation lesion.  Patient is allergic to IV contrast dye so she was unable to get a CTA during her initial ED visit.  Glucose 116.   On my exam, patient did not have any focal neurologic deficits aside from the reported dizziness.  She had no numbness or tingling.  She had symmetric face with no droop.  Extraocular movement were intact.  Visual fields were intact.  Patient had normal finger nose finger testing and normal coordination with heel-to-shin on the legs.  Patient reported subjective weakness in the right leg however she had no extremity drift with leg raise and there is no visible difference and strength of the legs.  Normal plantar flexion bilaterally.  Normal pulses in all extremities.  Patient reported onset of headache after arrival to the ED.  It is frontal and moderate.  Neurology recommended providing the patient with meclizine  initially.  If MRI is negative, they would consider providing a headache cocktail to help rule out a comp gated migraine causing symptoms as well as the peripheral vertigo.  Patient will be transported to [facility] for further workup of her symptoms.   Final Clinical Impressions(s) / ED Diagnoses   Final diagnoses:  Dizziness  Blurry vision, bilateral  Subjective muscle weakness  Acute nonintractable headache, unspecified headache type     Clinical Impression: 1. Dizziness   2. Blurry vision, bilateral   3. Subjective muscle weakness   4. Acute nonintractable headache, unspecified headache type     Disposition: Care transferred to [facility] for further assessment reevaluation after MRIs  are completed.  Anticipate speaking with neurology if MRIs show any R normality's however if MRIs are negative, suspect peripheral vertigo versus comp gated migraine causing her symptoms.     Tegeler, Canary Brim, MD 12/28/17 [phone]

## 2017-12-28 NOTE — Consult Note (Signed)
[facility] TeleNeurology Consult Services   TeleStroke Metrics: LKW: 0730 Door Time: 0855  [facility] Contacted: 0919  [facility] at Bedside: 0929 NIHSS: 0936 Decision on Alteplase: Patient was in agreement with not to receive IV alteplase since her NIH stroke scale score is 0 and she is improving.  Interventional Candidate: Not a candidate as her symptoms are not consistent with a large vessel proximal occlusion.   Chief Complaint: Acute dizziness and blurry vision   HPI: Asked to see this patient in telemedicine consultation. ?Consultation was performed with assistance of ancillary / medical staff at bedside.   Verbal consent to perform the examination with telemedicine was obtained. Patient agreed to proceed with the consultation.  42 year old right-handed African-American female who presents to the ER from work after having acute onset of dizziness.  Patient does not take any aspirin at baseline.  Patient states around 7:30 AM, she was sitting at her desk while at work.  She then had acute onset of dizziness where she describes feeling lightheaded, room spinning, and off balance.  She mostly noted feeling off balance with room spinning.  She denies any recent head trauma or recent upper respiratory tract infection.  She has never had these symptoms before.    As she stood up, she continued to feel off balance and maybe had some right leg weakness.  She had bilateral blurry vision.  She then started developing significant nausea and vomiting.  She did note that the vertigo came on with movement.  When she currently lies in bed, she has no symptoms.  About 30 minutes prior to her presentation in the ER, she then developed a bifrontal headache that she currently scores a 6/10 on pain scale.  She denies any associated photophobia.  Her nausea is better currently.  She does not have a history of migraines.  I reviewed with the patient about the availability of IV alteplase.  I  reviewed with her some of the potential side effects of IV alteplase to include an approximate 6% risk of symptomatic intracranial hemorrhage, internal bleeding, and/or angioedema.  I also reviewed with her some of the potential benefits of IV alteplase to include an approximate 30% chance of improvement at 3 months with the medication versus an approximate 20% chance of improvement at 3 months without the medication.  I also counseled her that she could be having a peripheral vestibulopathy as well.  At the end of the day, given that the patient is better and her NIH stroke scale score is 0, she was in agreement with not to receive IV alteplase.    PMH: Hypertension   SOC: Negative x3.  Patient's daughter lives with her.   FMH: Negative for strokes or migraines.   ROS: 13 point review systems were reviewed with the patient, and are all negative with the exception of the aforementioned in the history of present illness.   VS: Blood pressure 130/85 and weight 94.4 kg   Exam: Patient is in no apparent distress.  Patient appears as stated age.  No obvious acute respiratory or cardiac distress.  Patient is well groomed and well-nourished. 1a- LOC: Keenly responsive - 0     1b- LOC questions: Answers both questions correctly - 0    1c- LOC commands- Performs both tasks correctly- 0    2- Gaze: Normal; no gaze paresis or gaze deviation - 0    3- Visual Fields: normal, no Visual field deficit - 0    4- Facial movements: no facial palsy -  0    5- Upper limb motor - no drift - 0    6- Lower limb motor - no drift - 0     7- Limb Coordination: absent ataxia - 0     8- Sensory: no sensory loss - 0     9- Language - No aphasia - 0     10- Speech - No dysarthria -0    11- Neglect / Extinction - none found - 0   NIHSS score: 0    Diagnostic Data: Blood glucose 116  CT of the head showed no acute intracranial process.   Medical Data Reviewed:   1.Data?reviewed include clinical labs, radiology,?and  medical tests;   2.Tests?results discussed w/performing or interpreting physician;   3.Obtaining/reviewing old medical records;   4.Obtaining?case history from another source;   5.Independent?review of image, tracing, or specimen.    Medical Decision Making:   - Extensive number of diagnosis or management options are considered below.   - Extensive amount of complex data reviewed.   - High risk of complication and/or morbidity or mortality are associated with differential diagnostic considerations below.   - There may be?uncertain?outcome and increased probability of prolonged functional impairment or high probability of severe prolonged functional impairment associated with some of these differential diagnosis.    Differential Diagnosis for Stroke:   1.?Cardioembolic?stroke   2. Small vessel disease/lacune   3. Thromboembolic, artery-to-artery mechanism   4.?Hypercoagulable?state-related infarct   5. Transient ischemic attack   6. Thrombotic mechanism, large artery disease    Assessment:  1.  Acute vestibulopathy.  Likely peripheral etiology versus central etiology. 2.  Bifrontal headaches 3.  Hypertension  Recommendations: Patient can be admitted to the hospital for further evaluation of her symptoms. Check MRI of the brain with and without contrast to rule out any acute intracranial process such as stroke or demyelinating disease. Check MRA of the head and neck with and without contrast to evaluate her intracranial and extracranial blood vessels, with particular attention to the posterior circulation. Patient is allergic to iodine-based contrast, and cannot receive CTAs. Can place the patient on a trial of meclizine. Can also give the patient a trial of IV Toradol and IV Reglan for headaches. Consult local neurology team to assist with evaluation and management. Continue supportive care. Plan of care was discussed with the patient.   Thank you for allowing [facility] to  participate in the care of your patient. Please call me, Dr. Adrienne Mocha, with any questions at [phone]. Case discussed with the ER staff and Dr. Rush Landmark.   Critical Care notation:   I was called to see this critical patient emergently. I personally evaluated this critical patient for acute stroke evaluation, and determining their eligibility for IV Alteplase and interventional therapies.  I have spent approximately 18 minutes with the patient, including time at bedside, time discussing the case with other physicians, reviewing plan of care, and time independently reviewing the records and scans.

## 2017-12-28 NOTE — ED Provider Notes (Signed)
10 AM seen by me after transfer [facility].  Patient developed sudden onset vertigo, nausea and vomiting and right leg weakness 7:15 AM today while at work.  Symptoms accompanied by vision going "black in both eyes and throbbing frontal headache.  Vision headaches return to neuro normal.  Right leg weakness has improved.  She still complains of mild headache and sensation of room spinning symptoms of vertigo has improved as well.  She denies other complaint.  No other associated symptoms.  On exam alert Glasgow Coma Score 15 HEENT exam no facial asymmetry eyes extraocular muscles intact.  Neck is supple trachea midline lungs clear to auscultation heart regular rate and rhythm abdomen nondistended nontender all 4 extremities without redness swelling or tenderness neurovascular intact neurologic Glasgow Coma Score 15 cranial nerves II through XII intact motor strength 5/5 overall finger to nose normal  She [facility].  Dr. Laurence SlateAroor evaluated patient in the ED and is requesting overnight stay.  Aspirin.  She will require echocardiogram.  He feels the vertigo is felt to be central in etiology.  I consulted Dr. Obie DredgeBlum from medical service who will arrange for overnight stay.  Aspirin ordered by me Results for orders placed or performed during the hospital encounter of 12/28/17  CBC  Result Value Ref Range   WBC 4.9 4.0 - 10.5 K/uL   RBC 4.06 3.87 - 5.11 MIL/uL   Hemoglobin 13.0 12.0 - 15.0 g/dL   HCT 95.638.6 21.336.0 - 08.646.0 %   MCV 95.1 78.0 - 100.0 fL   MCH 32.0 26.0 - 34.0 pg   MCHC 33.7 30.0 - 36.0 g/dL   RDW 57.811.5 46.911.5 - 62.915.5 %   Platelets 249 150 - 400 K/uL  Differential  Result Value Ref Range   Neutrophils Relative % 63 %   Neutro Abs 3.1 1.7 - 7.7 K/uL   Lymphocytes Relative 29 %   Lymphs Abs 1.4 0.7 - 4.0 K/uL   Monocytes Relative 7 %   Monocytes Absolute 0.4 0.1 - 1.0 K/uL   Eosinophils Relative 1 %   Eosinophils  Absolute 0.1 0.0 - 0.7 K/uL   Basophils Relative 0 %   Basophils Absolute 0.0 0.0 - 0.1 K/uL  Comprehensive metabolic panel  Result Value Ref Range   Sodium 138 135 - 145 mmol/L   Potassium 3.7 3.5 - 5.1 mmol/L   Chloride 105 101 - 111 mmol/L   CO2 22 22 - 32 mmol/L   Glucose, Bld 115 (H) 65 - 99 mg/dL   BUN 11 6 - 20 mg/dL   Creatinine, Ser 5.280.72 0.44 - 1.00 mg/dL   Calcium 9.4 8.9 - 41.310.3 mg/dL   Total Protein 8.8 (H) 6.5 - 8.1 g/dL   Albumin 4.4 3.5 - 5.0 g/dL   AST 27 15 - 41 U/L   ALT 24 14 - 54 U/L   Alkaline Phosphatase 76 38 - 126 U/L   Total Bilirubin 0.6 0.3 - 1.2 mg/dL   GFR calc non Af Amer >60 >60 mL/min   GFR calc Af Amer >60 >60 mL/min   Anion gap 11 5 - 15  Troponin I  Result Value Ref Range   Troponin I <0.03 <0.03 ng/mL  CBG monitoring, ED  Result Value Ref Range   Glucose-Capillary 116 (H) 65 - 99 mg/dL  I-Stat Beta hCG blood, ED ([facility], [facility], [facility] only)  Result Value Ref Range   I-stat hCG, quantitative <5.0 <5  mIU/mL   Comment 3           Mr Angiogram Head Wo Contrast  Result Date: 12/28/2017 CLINICAL DATA:  Sudden onset of dizziness with vomiting and blurred vision that began with weakness in the right leg. EXAM: MRI HEAD WITHOUT AND WITH CONTRAST MRA HEAD WITHOUT CONTRAST MRA NECK WITHOUT AND WITH CONTRAST TECHNIQUE: Multiplanar, multiecho pulse sequences of the brain and surrounding structures were obtained without and with intravenous contrast. Angiographic images of the Circle of Willis were obtained using MRA technique without intravenous contrast. Angiographic images of the neck were obtained using MRA technique without and with intravenous contrast. Carotid stenosis measurements (when applicable) are obtained utilizing NASCET criteria, using the distal internal carotid diameter as the denominator. CONTRAST:  15mL MULTIHANCE GADOBENATE DIMEGLUMINE 529 MG/ML IV SOLN COMPARISON:  Head CT from earlier today FINDINGS: MRI HEAD FINDINGS Brain: No acute infarction,  hemorrhage, hydrocephalus, extra-axial collection or mass lesion. 4 or 5 FLAIR hyperintensities in the cerebral white matter, nonspecific but usually from old microvascular insults in this patient with history of hypertension. No specific demyelinating pattern. Normal brain volume. The cerebellar tonsils project 4-5 mm below the foramen magnum, but no pointing or definite foramen magnum stenosis to imply Chiari 1 malformation Vascular: Normal flow voids and vascular enhancements. Arterial findings below. Skull and upper cervical spine: No evidence of marrow lesion Sinuses/Orbits: Negative MRA HEAD FINDINGS Mild motion artifact. No branch occlusion, beading, aneurysm or stenosis. MRA NECK FINDINGS Antegrade flow in both carotid and vertebral circulation by time-of-flight imaging. No detected incidental soft tissue finding Normal appearance of the aortic arch. Three vessel branching pattern. There is mild indistinctness of the distal right vertebral artery that is attributed to superimposed venous plexus enhancement based on the acquisition images. Allowing for this there is no beading or stenosis throughout the bilateral carotid and vertebral circulation. IMPRESSION: Brain MRI: 1. No acute finding or explanation for symptoms. 2. Few small signal abnormalities in the cerebral white matter which may be related patient's history of hypertension. Intracranial and neck MRA: Negative. Electronically Signed   By: Marnee Spring M.D.   On: 12/28/2017 13:41   Mr Angiogram Neck W Or Wo Contrast  Result Date: 12/28/2017 CLINICAL DATA:  Sudden onset of dizziness with vomiting and blurred vision that began with weakness in the right leg. EXAM: MRI HEAD WITHOUT AND WITH CONTRAST MRA HEAD WITHOUT CONTRAST MRA NECK WITHOUT AND WITH CONTRAST TECHNIQUE: Multiplanar, multiecho pulse sequences of the brain and surrounding structures were obtained without and with intravenous contrast. Angiographic images of the Circle of Willis  were obtained using MRA technique without intravenous contrast. Angiographic images of the neck were obtained using MRA technique without and with intravenous contrast. Carotid stenosis measurements (when applicable) are obtained utilizing NASCET criteria, using the distal internal carotid diameter as the denominator. CONTRAST:  15mL MULTIHANCE GADOBENATE DIMEGLUMINE 529 MG/ML IV SOLN COMPARISON:  Head CT from earlier today FINDINGS: MRI HEAD FINDINGS Brain: No acute infarction, hemorrhage, hydrocephalus, extra-axial collection or mass lesion. 4 or 5 FLAIR hyperintensities in the cerebral white matter, nonspecific but usually from old microvascular insults in this patient with history of hypertension. No specific demyelinating pattern. Normal brain volume. The cerebellar tonsils project 4-5 mm below the foramen magnum, but no pointing or definite foramen magnum stenosis to imply Chiari 1 malformation Vascular: Normal flow voids and vascular enhancements. Arterial findings below. Skull and upper cervical spine: No evidence of marrow lesion Sinuses/Orbits: Negative MRA HEAD FINDINGS Mild motion artifact. No  branch occlusion, beading, aneurysm or stenosis. MRA NECK FINDINGS Antegrade flow in both carotid and vertebral circulation by time-of-flight imaging. No detected incidental soft tissue finding Normal appearance of the aortic arch. Three vessel branching pattern. There is mild indistinctness of the distal right vertebral artery that is attributed to superimposed venous plexus enhancement based on the acquisition images. Allowing for this there is no beading or stenosis throughout the bilateral carotid and vertebral circulation. IMPRESSION: Brain MRI: 1. No acute finding or explanation for symptoms. 2. Few small signal abnormalities in the cerebral white matter which may be related patient's history of hypertension. Intracranial and neck MRA: Negative. Electronically Signed   By: Marnee Spring M.D.   On:  12/28/2017 13:41   Mr Laqueta Jean And Wo Contrast  Result Date: 12/28/2017 CLINICAL DATA:  Sudden onset of dizziness with vomiting and blurred vision that began with weakness in the right leg. EXAM: MRI HEAD WITHOUT AND WITH CONTRAST MRA HEAD WITHOUT CONTRAST MRA NECK WITHOUT AND WITH CONTRAST TECHNIQUE: Multiplanar, multiecho pulse sequences of the brain and surrounding structures were obtained without and with intravenous contrast. Angiographic images of the Circle of Willis were obtained using MRA technique without intravenous contrast. Angiographic images of the neck were obtained using MRA technique without and with intravenous contrast. Carotid stenosis measurements (when applicable) are obtained utilizing NASCET criteria, using the distal internal carotid diameter as the denominator. CONTRAST:  15mL MULTIHANCE GADOBENATE DIMEGLUMINE 529 MG/ML IV SOLN COMPARISON:  Head CT from earlier today FINDINGS: MRI HEAD FINDINGS Brain: No acute infarction, hemorrhage, hydrocephalus, extra-axial collection or mass lesion. 4 or 5 FLAIR hyperintensities in the cerebral white matter, nonspecific but usually from old microvascular insults in this patient with history of hypertension. No specific demyelinating pattern. Normal brain volume. The cerebellar tonsils project 4-5 mm below the foramen magnum, but no pointing or definite foramen magnum stenosis to imply Chiari 1 malformation Vascular: Normal flow voids and vascular enhancements. Arterial findings below. Skull and upper cervical spine: No evidence of marrow lesion Sinuses/Orbits: Negative MRA HEAD FINDINGS Mild motion artifact. No branch occlusion, beading, aneurysm or stenosis. MRA NECK FINDINGS Antegrade flow in both carotid and vertebral circulation by time-of-flight imaging. No detected incidental soft tissue finding Normal appearance of the aortic arch. Three vessel branching pattern. There is mild indistinctness of the distal right vertebral artery that is  attributed to superimposed venous plexus enhancement based on the acquisition images. Allowing for this there is no beading or stenosis throughout the bilateral carotid and vertebral circulation. IMPRESSION: Brain MRI: 1. No acute finding or explanation for symptoms. 2. Few small signal abnormalities in the cerebral white matter which may be related patient's history of hypertension. Intracranial and neck MRA: Negative. Electronically Signed   By: Marnee Spring M.D.   On: 12/28/2017 13:41   Ct Head Code Stroke Wo Contrast`  Result Date: 12/28/2017 CLINICAL DATA:  Code stroke. Sudden onset dizziness with vomiting and blurred vision. Weakness in the right leg EXAM: CT HEAD WITHOUT CONTRAST TECHNIQUE: Contiguous axial images were obtained from the base of the skull through the vertex without intravenous contrast. COMPARISON:  None available FINDINGS: Brain: No evidence of acute infarction, hemorrhage, hydrocephalus, extra-axial collection or mass lesion/mass effect. Mildly low cerebellar tonsils projecting 3-4 mm below the foramen magnum. No pointing or high-grade foramen magnum stenosis to imply Chiari 1 malformation. Cervical MRI in 2011 with no syrinx. Vascular: No hyperdense vessel or unexpected calcification. Skull: Normal. Negative for fracture or focal lesion. Sinuses/Orbits: No acute finding.  Other: These results were called by telephone at the time of interpretation on 12/28/2017 at 9:25 am to Dr. Lynden Oxford , who verbally acknowledged these results. ASPECTS [facility] Stroke Program Early CT Score) - Ganglionic level infarction (caudate, lentiform nuclei, internal capsule, insula, M1-M3 cortex): 7 - Supraganglionic infarction (M4-M6 cortex): 3 Total score (0-10 with 10 being normal): 10 IMPRESSION: No acute finding. ASPECTS is 10. Electronically Signed   By: Marnee Spring M.D.   On: 12/28/2017 09:26     Doug Sou, MD 12/28/17 [phone]

## 2017-12-28 NOTE — ED Notes (Addendum)
All body piercing removed and placed in cup and given to boyfriend, with all valuables  except cell phone

## 2017-12-28 NOTE — ED Triage Notes (Signed)
Pt c/o sudden onset of dizziness with vomiting and blurred vision x 1 that started at 7:15 with weakness in her R leg.

## 2017-12-28 NOTE — H&P (Addendum)
Date: 12/28/2017               Patient Name:  Veronica Odom MRN: [id number]  DOB: [birth year] Age / Sex: 42 y.o., female   PCP: Patria Mane, MD         Medical Service: Internal Medicine Teaching Service         Attending Physician: Dr. Doug Sou, MD    First Contact: Dr. Lorenso Courier Pager: [phone]  Second Contact: Dr. Eulah Pont Pager: [phone]       After Hours (After 5p/  First Contact Pager: [phone]  weekends / holidays): Second Contact Pager: [phone]   Chief Complaint: Dizziness  History of Present Illness:  Ms. Vanwyk is a 42 y.o f with asthma and hypertension who presents with dizziness. The patient states that while at work this morning around 7:30am she started noticing that she was feeling lightheaded and dizzy as if the room was spinning. The patient noted that when she started to walk she felt her right leg was weak. She subsequently lost her vision for a few seconds. She stated that everything went black and then she slowly regained her vision over the next few minutes, initially seeing stars and a blurry visual field. She also vomited one time this morning while at work. It was an orange emesis that was non-bloody in nature.   The patient mentions that she had accompanied sob, feeling of her heart racing, diaphoresis, palpitations, and ringing in ears (but she says that she has thsi often at work as she works around [employer] account [employer]), and headache. She did not have a loss of consciousness, head trauma, numbness or tingling, nausea, abdominal pain, recent fever or upper respiratory infection, hearing changes, or ear fullness.   ED Course:  Code stroke was called, Glasgow coma score=15 CT head was negative for any acute intracranial abnormalities   Meds:  Current Meds  Medication Sig  . acetaminophen (TYLENOL) 500 MG chewable tablet Chew 1,000 mg by mouth every 6 (six) hours as needed for pain.  Marland Kitchen albuterol (PROVENTIL HFA) 108 (90 Base) MCG/ACT inhaler Inhale  1 puff into the lungs daily as needed for shortness of breath.  Marland Kitchen amLODipine (NORVASC) 10 MG tablet Take 10 mg by mouth at bedtime.   . beclomethasone (QVAR) 40 MCG/ACT inhaler Inhale 2 puffs into the lungs 2 (two) times daily.  . fluticasone (FLONASE) 50 MCG/ACT nasal spray Place 1 spray into both nostrils daily.  . meloxicam (MOBIC) 15 MG tablet Take 15 mg by mouth daily.    Allergies: Allergies as of 12/28/2017 - Review Complete 12/28/2017  Allergen Reaction Noted  . Shellfish-derived products Anaphylaxis 09/02/2014  . Ivp dye [iodinated diagnostic agents] Hives and Itching 03/09/2016  . Latex Hives and Itching 03/09/2016  . Penicillins Hives and Swelling 03/09/2016   Past Medical History:  Diagnosis Date  . Asthma   . Hypertension    Family History:   Heart disease-mother (young age) Lung, liver, and prostate cancer-father Htn-mother DM- mother  Social History:  Never smoker Drinks etoh socially No illicit drug use Daughter stays with her. They live in [facility]. She works as a Location manager in [employer]  Review of Systems: A complete ROS was negative except as per HPI.   Physical Exam: Blood pressure 104/86, pulse (!) 106, temperature 98.1 F (36.7 C), temperature source Oral, resp. rate 18, weight 208 lb 1.8 oz (94.4 kg), last menstrual period 12/24/2017, SpO2 96 %.  Physical Exam  Constitutional:  She is oriented to person, place, and time. She appears well-developed and well-nourished. No distress.  HENT:  Head: Normocephalic and atraumatic.  Eyes: Conjunctivae are normal.  Cardiovascular: Normal rate, regular rhythm and normal heart sounds.  Respiratory: Effort normal and breath sounds normal. No respiratory distress. She has no wheezes.  GI: Soft. Bowel sounds are normal. She exhibits no distension. There is no tenderness.  Musculoskeletal: She exhibits no edema.  Neurological: She is alert and oriented to person, place, and time. No cranial nerve  deficit. She exhibits normal muscle tone. Coordination normal.  Vertical nystagmus visualized Finger to nose, heel to shin normal. Bilateral patellar reflexes intact. 4/5 bilateral upper extremity strength. 5/5 bilateral lower extremity strength.    Skin: She is not diaphoretic. No erythema.  Psychiatric: She has a normal mood and affect. Her behavior is normal. Judgment and thought content normal.   CMP Latest Ref Rng & Units 12/28/2017 01/08/2017  Glucose 65 - 99 mg/dL 161(W115(H) 960(A101(H)  BUN 6 - 20 mg/dL 11 15  Creatinine 5.400.44 - 1.00 mg/dL 9.810.72 1.910.83  Sodium 478135 - 145 mmol/L 138 133(L)  Potassium 3.5 - 5.1 mmol/L 3.7 3.5  Chloride 101 - 111 mmol/L 105 103  CO2 22 - 32 mmol/L 22 22  Calcium 8.9 - 10.3 mg/dL 9.4 8.9  Total Protein 6.5 - 8.1 g/dL 2.9(F8.8(H) 7.5  Total Bilirubin 0.3 - 1.2 mg/dL 0.6 0.5  Alkaline Phos 38 - 126 U/L 76 62  AST 15 - 41 U/L 27 22  ALT 14 - 54 U/L 24 20   Troponin (Point of Care Test) No results for input(s): TROPIPOC in the last 72 hours.   CBC Latest Ref Rng & Units 12/28/2017 01/08/2017  WBC 4.0 - 10.5 K/uL 4.9 7.7  Hemoglobin 12.0 - 15.0 g/dL 62.113.0 11.7(L)  Hematocrit 36.0 - 46.0 % 38.6 34.7(L)  Platelets 150 - 400 K/uL 249 206    EKG: personally reviewed my interpretation is normal sinus rhythm, no st or t wave abnormalities.  CXR: Not done  CT head (12/28/17): No acute finding  MR Brain (12/28/17): 1. No acute finding or explanation for symptoms. 2. Few small signal abnormalities in the cerebral white matter which may be related patient's history of hypertension.  Intracranial neck MRA:  Negative.  MRA head and neck (12/28/17): Brain MRI:  1. No acute finding or explanation for symptoms. 2. Few small signal abnormalities in the cerebral white matter which may be related patient's history of hypertension.  Intracranial and neck MRA:  Negative.  Assessment & Plan by Problem:  Ms. Fredric MareBailey is a 42 y.o f with asthma and hypertension who  presents with dizziness. CT head, MRI brain, and MRA head and neck did not find any acute intracranial abnormalities.  Right lower extremity weakness and dizziness The patient presented with acute right lower extremity weakness and dizziness that started this morning. CT head, MRI brain, and MRA head and neck did not find any acute intracranial abnormalities. There is small possibility that the patient may have had a posterior circulation stroke that may have been missed on imaging. However, have low suspicion as the patient's coordination was normal on exam. Will risk stratify the patient for stroke.   The patient's symptoms of the room spinning around makes it likely that she has vertigo that is causing symptoms. Presyncope is unlikely as the patient did not have feeling of fainting or loss of consciousness. She did not have any acute upper respiratory symptoms that makes labyrinthitis or  vestibular neuritis less likely. The patient did not have tinnitus or coordination issues which makes meniere's disease. The patient's symptoms are most congruent with BPPV.  The patient's symptoms of headache, loss of vision, and weakness also make complex migraine a possible diagnosis.   -Appreciate neurology following -vestibular physical therapy to try epley maneuver -HbA1c pending -Lipid panel pending -Meclizine 25mg  given -Placed on telemetry -Aspirin 325mg  given  Hypertension The patient's blood pressure since admission has been ranging 104-130/85-86  -Holding home amlodipine 10mg  qhs  Asthma The patient does not have any acute respiratory distress or increased work of breathing.   -Proventil qd prn -Pulmicort bid qd   Dispo: Admit patient to Observation with expected length of stay less than 2 midnights.  SignedLorenso Courier, MD 12/28/2017, 4:38 PM  Pager: [phone]

## 2017-12-28 NOTE — ED Notes (Signed)
ED Provider at bedside. 

## 2017-12-28 NOTE — ED Notes (Signed)
Attempted report 

## 2017-12-28 NOTE — ED Notes (Signed)
Patient transported to CT 

## 2017-12-28 NOTE — ED Notes (Signed)
Teleneuro in progress, with ED MD at side

## 2017-12-28 NOTE — ED Notes (Signed)
Attempted IV to LFA, unsuccessful. [facility] at bedside to attempt IV

## 2017-12-28 NOTE — Consult Note (Addendum)
NEUROHOSPITALISTS -  CONSULTATION NOTE   Requesting Physician: Dr. Doug Sou [facility]: Dr. Georgiana Spinner Aroor  Admit date: 12/28/2017    Chief Complaint: Acute onset dizziness, blurred vision and right leg weakness  History obtained from:   Patient and Chart     HPI   Veronica Odom is an 42 y.o. female with a PMH of HTN who presents to the ED with acute onset vertigo, blurred vision and Right leg weakness. Patient was evaluated by Tele-Neurology. NIHSS was 0.  Neurology asked to formally evaluate patient.  All imaging thus far negative for acute findings.  According to the patient she had an acute onset of dizziness, blurred vision and one episode of nausea/vomiting while sitting at her desk at work this morning. She also noticed some Right leg weakness at the same time. She states the dizziness is worse with head movement. She also tells me that her Right leg is chronically weak s/p knee surgery and she had noticed some fluid build up on her knee prior to work.  On arrival to the ED patient complained of a 6/10 H/A. At the time of this examination, all of the patients presentation symptoms have resolved. She states she is complaint with her B/P medications but has not seen her PCP in quite some time. She does not monitor her B/P readings at home.  She denies any recent injury, illness or travel. She denies any CP or SOB. No further episodes of N/V and no diarrhea. She denies any hormone therapy. She denies any daily ASA therapy. Denies any hx of Migraines. Denies any family history of CVA.  Date last known well: Date: 12/28/2017 Time last known well: Time: 07:15 tPA Given: No: NIHSS = 0 Modified Rankin: Rankin Score=0 NIH Stroke Scale: 0  Past Medical History    Past Medical History:  Diagnosis Date  . Asthma   . Hypertension    Past Surgical History   Past Surgical History:  Procedure  Laterality Date  . BREAST SURGERY    . CHOLECYSTECTOMY    . KNEE SURGERY    . TUBAL LIGATION     Family History  No family history on file.  Social History   reports that she has never smoked. She has never used smokeless tobacco. She reports that she drinks alcohol. She reports that she does not use drugs.  Allergies   Allergies  Allergen Reactions  . Shellfish-Derived Products Anaphylaxis  . Ivp Dye [Iodinated Diagnostic Agents] Hives and Itching  . Latex Hives and Itching  . Penicillins Hives and Swelling    Has patient had a PCN reaction causing immediate rash, facial/tongue/throat swelling, SOB or lightheadedness with hypotension: Yes  Has patient had a PCN reaction causing severe rash involving mucus membranes or skin necrosis: Yes Has patient had a PCN reaction that required hospitalization: Yes Has patient had a PCN reaction occurring within the last 10 years: no If all of the above answers are "NO", then may proceed with Cephalosporin use.    Medications Prior to Admission   No current facility-administered medications on file prior to encounter.    Current Outpatient Medications on File Prior to Encounter  Medication Sig Dispense Refill  . acetaminophen (TYLENOL) 500 MG chewable tablet Chew 1,000 mg by mouth every 6 (six) hours as needed for pain.    Marland Kitchen albuterol (PROVENTIL HFA) 108 (90 Base) MCG/ACT inhaler Inhale 1 puff into the lungs daily as needed for shortness of breath.    Marland Kitchen amLODipine (NORVASC) 10  MG tablet Take 10 mg by mouth at bedtime.     . beclomethasone (QVAR) 40 MCG/ACT inhaler Inhale 2 puffs into the lungs 2 (two) times daily.    . fluticasone (FLONASE) 50 MCG/ACT nasal spray Place 1 spray into both nostrils daily.    . meloxicam (MOBIC) 15 MG tablet Take 15 mg by mouth daily.    . methocarbamol (ROBAXIN) 500 MG tablet Take 1 tablet (500 mg total) by mouth 2 (two) times daily. 20 tablet 0  . naproxen (NAPROSYN) 500 MG tablet Take 1 tablet (500 mg  total) by mouth 2 (two) times daily. 30 tablet 0   ROS  History obtained from the patient General ROS: negative for - chills, fatigue, fever, night sweats, weight gain or weight loss Psychological ROS: negative for - behavioral disorder, hallucinations, memory difficulties, mood swings or suicidal ideation Ophthalmic ROS: negative for - blurry vision, double vision, eye pain or loss of vision ENT ROS: negative for - epistaxis, nasal discharge, oral lesions, sore throat, tinnitus or vertigo Allergy and Immunology ROS: negative for - hives or itchy/watery eyes Hematological and Lymphatic ROS: negative for - bleeding problems, bruising or swollen lymph nodes Endocrine ROS: negative for - galactorrhea, hair pattern changes, polydipsia/polyuria or temperature intolerance Respiratory ROS: negative for - cough, hemoptysis, shortness of breath or wheezing Cardiovascular ROS: negative for - chest pain, dyspnea on exertion, edema or irregular heartbeat Gastrointestinal ROS: negative for - abdominal pain, diarrhea, hematemesis, nausea/vomiting or stool incontinence Genito-Urinary ROS: negative for - dysuria, hematuria, incontinence or urinary frequency/urgency Musculoskeletal ROS: negative for - joint swelling or muscular weakness Neurological ROS: as noted in HPI Dermatological ROS: negative for rash and skin lesion changes  Physical Examination   Vitals:   12/28/17 0905 12/28/17 0906 12/28/17 0933  BP: 127/88  130/85  Pulse: 90  85  Resp: 18  18  Temp: 98.1 F (36.7 C)    TempSrc: Oral    SpO2: 100%  100%  Weight:  94.4 kg (208 lb 1.8 oz)    HEENT-  Normocephalic,  Cardiovascular - Regular rate and rhythm  Respiratory - Lungs clear bilaterally. Non-labored breathing, No wheezing. Abdomen - soft and non-tender, BS normal Extremities- no edema or cyanosis Skin-warm and dry  Neurological Examination  Mental Status: Alert, oriented, thought content appropriate.  Speech fluent without  evidence of aphasia or neglect.  Able to follow 3 step commands without difficulty. Cranial Nerves: II: Visual Fields are full. Pupils are equal, round, and reactive to light.   III,IV, VI: Horizontal and vertical nystagmus.  V: Facial sensation is symmetric to temperature VII: Facial movement is symmetric.  VIII: hearing is intact to voice X: Uvula elevates symmetrically XI: Shoulder shrug is symmetric. XII: tongue is midline without atrophy or fasciculations.  Motor: Tone is normal. Bulk is normal. 5/5 strength was present in all four extremities.  Sensor: Sensation is symmetric to light touch and temperature in the arms and legs. Deep Tendon Reflexes: 2+ and symmetric throughout in the biceps and patellae Plantars: Toes are downgoing bilaterally.  Cerebellar: normal finger-to-nose, normal rapid alternating movements and normal heel-to-shin test Gait: not tested. No obvious truncal ataxia  Laboratory Results  CBC: Recent Labs  Lab 12/28/17 0932  WBC 4.9  HGB 13.0  HCT 38.6  MCV 95.1  PLT 249   Basic Metabolic Panel: Recent Labs  Lab 12/28/17 0932  NA 138  K 3.7  CL 105  CO2 22  GLUCOSE 115*  BUN 11  CREATININE 0.72  CALCIUM 9.4   Cardiac Enzymes: Recent Labs  Lab 12/28/17 0932  TROPONINI <0.03    Imaging Results  Mr Angiogram Head Wo Contrast  Result Date: 12/28/2017 CLINICAL DATA:  Sudden onset of dizziness with vomiting and blurred vision that began with weakness in the right leg. EXAM: MRI HEAD WITHOUT AND WITH CONTRAST MRA HEAD WITHOUT CONTRAST MRA NECK WITHOUT AND WITH CONTRAST TECHNIQUE: Multiplanar, multiecho pulse sequences of the brain and surrounding structures were obtained without and with intravenous contrast. Angiographic images of the Circle of Willis were obtained using MRA technique without intravenous contrast. Angiographic images of the neck were obtained using MRA technique without and with intravenous contrast. Carotid stenosis measurements  (when applicable) are obtained utilizing NASCET criteria, using the distal internal carotid diameter as the denominator. CONTRAST:  15mL MULTIHANCE GADOBENATE DIMEGLUMINE 529 MG/ML IV SOLN COMPARISON:  Head CT from earlier today FINDINGS: MRI HEAD FINDINGS Brain: No acute infarction, hemorrhage, hydrocephalus, extra-axial collection or mass lesion. 4 or 5 FLAIR hyperintensities in the cerebral white matter, nonspecific but usually from old microvascular insults in this patient with history of hypertension. No specific demyelinating pattern. Normal brain volume. The cerebellar tonsils project 4-5 mm below the foramen magnum, but no pointing or definite foramen magnum stenosis to imply Chiari 1 malformation Vascular: Normal flow voids and vascular enhancements. Arterial findings below. Skull and upper cervical spine: No evidence of marrow lesion Sinuses/Orbits: Negative MRA HEAD FINDINGS Mild motion artifact. No branch occlusion, beading, aneurysm or stenosis. MRA NECK FINDINGS Antegrade flow in both carotid and vertebral circulation by time-of-flight imaging. No detected incidental soft tissue finding Normal appearance of the aortic arch. Three vessel branching pattern. There is mild indistinctness of the distal right vertebral artery that is attributed to superimposed venous plexus enhancement based on the acquisition images. Allowing for this there is no beading or stenosis throughout the bilateral carotid and vertebral circulation. IMPRESSION: Brain MRI: 1. No acute finding or explanation for symptoms. 2. Few small signal abnormalities in the cerebral white matter which may be related patient's history of hypertension. Intracranial and neck MRA: Negative. Electronically Signed   By: Marnee Spring M.D.   On: 12/28/2017 13:41   Mr Angiogram Neck W Or Wo Contrast  Result Date: 12/28/2017 CLINICAL DATA:  Sudden onset of dizziness with vomiting and blurred vision that began with weakness in the right leg. EXAM:  MRI HEAD WITHOUT AND WITH CONTRAST MRA HEAD WITHOUT CONTRAST MRA NECK WITHOUT AND WITH CONTRAST TECHNIQUE: Multiplanar, multiecho pulse sequences of the brain and surrounding structures were obtained without and with intravenous contrast. Angiographic images of the Circle of Willis were obtained using MRA technique without intravenous contrast. Angiographic images of the neck were obtained using MRA technique without and with intravenous contrast. Carotid stenosis measurements (when applicable) are obtained utilizing NASCET criteria, using the distal internal carotid diameter as the denominator. CONTRAST:  15mL MULTIHANCE GADOBENATE DIMEGLUMINE 529 MG/ML IV SOLN COMPARISON:  Head CT from earlier today FINDINGS: MRI HEAD FINDINGS Brain: No acute infarction, hemorrhage, hydrocephalus, extra-axial collection or mass lesion. 4 or 5 FLAIR hyperintensities in the cerebral white matter, nonspecific but usually from old microvascular insults in this patient with history of hypertension. No specific demyelinating pattern. Normal brain volume. The cerebellar tonsils project 4-5 mm below the foramen magnum, but no pointing or definite foramen magnum stenosis to imply Chiari 1 malformation Vascular: Normal flow voids and vascular enhancements. Arterial findings below. Skull and upper cervical spine: No evidence of marrow lesion Sinuses/Orbits: Negative MRA  HEAD FINDINGS Mild motion artifact. No branch occlusion, beading, aneurysm or stenosis. MRA NECK FINDINGS Antegrade flow in both carotid and vertebral circulation by time-of-flight imaging. No detected incidental soft tissue finding Normal appearance of the aortic arch. Three vessel branching pattern. There is mild indistinctness of the distal right vertebral artery that is attributed to superimposed venous plexus enhancement based on the acquisition images. Allowing for this there is no beading or stenosis throughout the bilateral carotid and vertebral circulation.  IMPRESSION: Brain MRI: 1. No acute finding or explanation for symptoms. 2. Few small signal abnormalities in the cerebral white matter which may be related patient's history of hypertension. Intracranial and neck MRA: Negative. Electronically Signed   By: Marnee Spring M.D.   On: 12/28/2017 13:41   Mr Laqueta Jean And Wo Contrast  Result Date: 12/28/2017 CLINICAL DATA:  Sudden onset of dizziness with vomiting and blurred vision that began with weakness in the right leg. EXAM: MRI HEAD WITHOUT AND WITH CONTRAST MRA HEAD WITHOUT CONTRAST MRA NECK WITHOUT AND WITH CONTRAST TECHNIQUE: Multiplanar, multiecho pulse sequences of the brain and surrounding structures were obtained without and with intravenous contrast. Angiographic images of the Circle of Willis were obtained using MRA technique without intravenous contrast. Angiographic images of the neck were obtained using MRA technique without and with intravenous contrast. Carotid stenosis measurements (when applicable) are obtained utilizing NASCET criteria, using the distal internal carotid diameter as the denominator. CONTRAST:  15mL MULTIHANCE GADOBENATE DIMEGLUMINE 529 MG/ML IV SOLN COMPARISON:  Head CT from earlier today FINDINGS: MRI HEAD FINDINGS Brain: No acute infarction, hemorrhage, hydrocephalus, extra-axial collection or mass lesion. 4 or 5 FLAIR hyperintensities in the cerebral white matter, nonspecific but usually from old microvascular insults in this patient with history of hypertension. No specific demyelinating pattern. Normal brain volume. The cerebellar tonsils project 4-5 mm below the foramen magnum, but no pointing or definite foramen magnum stenosis to imply Chiari 1 malformation Vascular: Normal flow voids and vascular enhancements. Arterial findings below. Skull and upper cervical spine: No evidence of marrow lesion Sinuses/Orbits: Negative MRA HEAD FINDINGS Mild motion artifact. No branch occlusion, beading, aneurysm or stenosis. MRA NECK  FINDINGS Antegrade flow in both carotid and vertebral circulation by time-of-flight imaging. No detected incidental soft tissue finding Normal appearance of the aortic arch. Three vessel branching pattern. There is mild indistinctness of the distal right vertebral artery that is attributed to superimposed venous plexus enhancement based on the acquisition images. Allowing for this there is no beading or stenosis throughout the bilateral carotid and vertebral circulation. IMPRESSION: Brain MRI: 1. No acute finding or explanation for symptoms. 2. Few small signal abnormalities in the cerebral white matter which may be related patient's history of hypertension. Intracranial and neck MRA: Negative. Electronically Signed   By: Marnee Spring M.D.   On: 12/28/2017 13:41   Ct Head Code Stroke Wo Contrast`  Result Date: 12/28/2017 CLINICAL DATA:  Code stroke. Sudden onset dizziness with vomiting and blurred vision. Weakness in the right leg EXAM: CT HEAD WITHOUT CONTRAST TECHNIQUE: Contiguous axial images were obtained from the base of the skull through the vertex without intravenous contrast. COMPARISON:  None available FINDINGS: Brain: No evidence of acute infarction, hemorrhage, hydrocephalus, extra-axial collection or mass lesion/mass effect. Mildly low cerebellar tonsils projecting 3-4 mm below the foramen magnum. No pointing or high-grade foramen magnum stenosis to imply Chiari 1 malformation. Cervical MRI in 2011 with no syrinx. Vascular: No hyperdense vessel or unexpected calcification. Skull: Normal. Negative for fracture or  focal lesion. Sinuses/Orbits: No acute finding. Other: These results were called by telephone at the time of interpretation on 12/28/2017 at 9:25 am to Dr. Lynden OxfordHRISTOPHER TEGELER , who verbally acknowledged these results. ASPECTS [facility]([place] Stroke Program Early CT Score) - Ganglionic level infarction (caudate, lentiform nuclei, internal capsule, insula, M1-M3 cortex): 7 - Supraganglionic  infarction (M4-M6 cortex): 3 Total score (0-10 with 10 being normal): 10 IMPRESSION: No acute finding. ASPECTS is 10. Electronically Signed   By: Marnee SpringJonathon  Watts M.D.   On: 12/28/2017 09:26    IMPRESSION AND PLAN  Veronica Odom is a 42 y.o. female with PMH of HTN who presents to the ED with acute onset vertigo, blurred vision and Right leg weakness. MR brain is negative for stroke, MRA does not show significant stenosis. However MRI brain can miss up to 20% of small posterior circulation strokes resenting with acute onset vertigo. On hints exam- she has cover-uncover test positive. She also has some vertical nystagmus which is concerning. I was also not able to reproduce horizontal nystagmus on Dix-Hallpike maneuver. She did complain of headache afterwards,but denies history of migraine.    Acute Vertigo -   Peripheral vertigo vs MRI Negative stroke vs Complicated Migraine.   Recommend  ASA, statin Echocardiogram  Aic, Lipid profile  Neurochecks PT/OT    Beryl MeagerMary A Odom, ANP-C [facility] 12/28/2017, 2:32 PM  12/28/2017 ATTENDING ASSESSMENT   I reviewed the history, exam documented by ARNP, however made changes as appropriate. Recommendations have been changed to include above.   While very likely this may have been peripheral in origin, she did have some red flags for possible central origin - including positive cover-uncover test, vertical nystagmus, history of loss of vision for 5-6 seconds, vascular risk factors. I feel is she has a greater than 50% chance of this being an MRI negative stroke and recommend expedited inpatient workup.    Georgiana SpinnerSushanth Aroor MD [facility] [phone]  If 7pm to 7am, please call on call as listed on AMION.  Please page stroke NP/ PA / MD from 8am -4 pm   You can look them up on [email or web address]  Password [email or web address]

## 2017-12-29 ENCOUNTER — Encounter (HOSPITAL_COMMUNITY): Payer: Self-pay | Admitting: General Practice

## 2017-12-29 DIAGNOSIS — J45909 Unspecified asthma, uncomplicated: Secondary | ICD-10-CM | POA: Diagnosis present

## 2017-12-29 DIAGNOSIS — Z91041 Radiographic dye allergy status: Secondary | ICD-10-CM | POA: Diagnosis not present

## 2017-12-29 DIAGNOSIS — R42 Dizziness and giddiness: Secondary | ICD-10-CM | POA: Diagnosis present

## 2017-12-29 DIAGNOSIS — I10 Essential (primary) hypertension: Secondary | ICD-10-CM | POA: Diagnosis present

## 2017-12-29 DIAGNOSIS — Z91040 Latex allergy status: Secondary | ICD-10-CM | POA: Diagnosis not present

## 2017-12-29 DIAGNOSIS — E785 Hyperlipidemia, unspecified: Secondary | ICD-10-CM | POA: Diagnosis present

## 2017-12-29 DIAGNOSIS — H8122 Vestibular neuronitis, left ear: Secondary | ICD-10-CM | POA: Diagnosis present

## 2017-12-29 DIAGNOSIS — Z8249 Family history of ischemic heart disease and other diseases of the circulatory system: Secondary | ICD-10-CM | POA: Diagnosis not present

## 2017-12-29 DIAGNOSIS — Z800 Family history of malignant neoplasm of digestive organs: Secondary | ICD-10-CM | POA: Diagnosis not present

## 2017-12-29 DIAGNOSIS — H5509 Other forms of nystagmus: Secondary | ICD-10-CM | POA: Diagnosis not present

## 2017-12-29 DIAGNOSIS — R402412 Glasgow coma scale score 13-15, at arrival to emergency department: Secondary | ICD-10-CM | POA: Diagnosis present

## 2017-12-29 DIAGNOSIS — Z801 Family history of malignant neoplasm of trachea, bronchus and lung: Secondary | ICD-10-CM | POA: Diagnosis not present

## 2017-12-29 DIAGNOSIS — H8190 Unspecified disorder of vestibular function, unspecified ear: Secondary | ICD-10-CM | POA: Diagnosis present

## 2017-12-29 DIAGNOSIS — Z8042 Family history of malignant neoplasm of prostate: Secondary | ICD-10-CM | POA: Diagnosis not present

## 2017-12-29 DIAGNOSIS — Z91013 Allergy to seafood: Secondary | ICD-10-CM | POA: Diagnosis not present

## 2017-12-29 DIAGNOSIS — Z79899 Other long term (current) drug therapy: Secondary | ICD-10-CM | POA: Diagnosis not present

## 2017-12-29 DIAGNOSIS — H8111 Benign paroxysmal vertigo, right ear: Secondary | ICD-10-CM | POA: Diagnosis present

## 2017-12-29 DIAGNOSIS — Z880 Allergy status to penicillin: Secondary | ICD-10-CM | POA: Diagnosis not present

## 2017-12-29 LAB — BASIC METABOLIC PANEL
Anion gap: 8 (ref 5–15)
BUN: 10 mg/dL (ref 6–20)
CALCIUM: 9.3 mg/dL (ref 8.9–10.3)
CHLORIDE: 106 mmol/L (ref 101–111)
CO2: 24 mmol/L (ref 22–32)
CREATININE: 0.77 mg/dL (ref 0.44–1.00)
GFR calc Af Amer: >60 mL/min (ref >60)
GFR calc non Af Amer: >60 mL/min (ref >60)
Glucose, Bld: 105 mg/dL — ABNORMAL HIGH (ref 65–99)
Potassium: 4.2 mmol/L (ref 3.5–5.1)
SODIUM: 138 mmol/L (ref 135–145)

## 2017-12-29 LAB — LIPID PANEL
CHOL/HDL RATIO: 4.8 ratio
CHOLESTEROL: 208 mg/dL — AB (ref 0–200)
HDL: 43 mg/dL (ref >40)
LDL Cholesterol: 126 mg/dL — ABNORMAL HIGH (ref 0–99)
Triglycerides: 195 mg/dL — ABNORMAL HIGH (ref <150)
VLDL: 39 mg/dL (ref 0–40)

## 2017-12-29 LAB — HIV ANTIBODY (ROUTINE TESTING W REFLEX): HIV SCREEN 4TH GENERATION: NONREACTIVE

## 2017-12-29 LAB — HEMOGLOBIN A1C
HEMOGLOBIN A1C: 4.6 % — AB (ref 4.8–5.6)
MEAN PLASMA GLUCOSE: 85.32 mg/dL

## 2017-12-29 LAB — TSH: TSH: 0.555 u[IU]/mL (ref 0.350–4.500)

## 2017-12-29 MED ORDER — STROKE: EARLY STAGES OF RECOVERY BOOK
Freq: Once | Status: AC
  Administered 2017-12-29: 13:00:00
  Filled 2017-12-29: qty 1

## 2017-12-29 NOTE — ED Notes (Signed)
Contacts were found in room in contact case.  Sent in tube station taped to a piece of paper with patient's sticker and room number to [facility].

## 2017-12-29 NOTE — Progress Notes (Addendum)
Education for nystagmus given to patient.   [email or web address]-and-[email or web address]/disorders/bppv/bppv.html

## 2017-12-29 NOTE — Care Management Note (Signed)
Case Management Note  Patient Details  Name: Marylene LandJoyce Kloeppel MRN: [id number] Date of Birth: Aug 16, 1976  Subjective/Objective:       Presents with dizziness, hx of asthma, HTN.      Redland CellarMichelle Spears (Sister)     [phone]       PCP: Patria ManeMike Gassemi  Action/Plan: Neuro following... PT evaluation pending... CM following for transitional care  needs.  Expected Discharge Date:                  Expected Discharge Plan:  Home/Self Care  In-House Referral:     Discharge planning Services  CM Consult  Post Acute Care Choice:    Choice offered to:     DME Arranged:    DME Agency:     HH Arranged:    HH Agency:     Status of Service:  In process, will continue to follow  If discussed at Long Length of Stay Meetings, dates discussed:    Additional Comments:  Epifanio LeschesCole, Elida Harbin Hudson, RN 12/29/2017, 8:43 AM

## 2017-12-29 NOTE — Evaluation (Signed)
Physical Therapy Evaluation Patient Details Name: Veronica Odom MRN: [id number] DOB: 04-Nov-1975 Today's Date: 12/29/2017   History of Present Illness  Ms. Chumney is a 42 y.o f with asthma and hypertension who presents with dizziness, right leg weakness and vision changes.  MRI and CT negative for ishcemia, but positive for signal abnormality in cerebral white matter.  Neurology note reporting vertical nystagmus and positive cover/uncover test.  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  She demonstrates R lateral nystagmus in room light worse with R gaze and has R rotary nystagmus with modified hall pike lasting >40 sec.  Treated x 1 with Eply, then x 2 with Semont maneuver for cupulolithiasis.  Feel she needs continued skilled PT to progress mobility and improve symptoms and follow up outpatient PT for vestibular rehab.      Follow Up Recommendations Supervision/Assistance - 24 hour;Outpatient PT(for vestibular rehab)    Equipment Recommendations  None recommended by PT    Recommendations for Other Services       Precautions / Restrictions Precautions Precautions: Fall      Mobility  Bed Mobility Overal bed mobility: Modified Independent                Transfers Overall transfer level: Needs assistance   Transfers: Sit to/from Stand Sit to Stand: Min guard         General transfer comment: for balance  Ambulation/Gait Ambulation/Gait assistance: Min guard;Min assist Ambulation Distance (Feet): 12 Feet(x 2) Assistive device: None;1 person hand held assist Gait Pattern/deviations: Step-to pattern;Decreased stride length;Trunk flexed     General Gait Details: reaching for hand holds and min A for balance at times to/from bathroom  Stairs            Wheelchair Mobility    Modified Rankin (Stroke Patients Only) Modified Rankin (Stroke Patients  Only) Pre-Morbid Rankin Score: No symptoms Modified Rankin: Moderately severe disability     Balance Overall balance assessment: Needs assistance   Sitting balance-Leahy Scale: Good       Standing balance-Leahy Scale: Fair Standing balance comment: can stand without UE support to wash hands                             Pertinent Vitals/Pain Pain Assessment: No/denies pain    Home Living Family/patient expects to be discharged to:: Private residence Living Arrangements: Spouse/significant other(boyfriend) Available Help at Discharge: Family                  Prior Function Level of Independence: Independent         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        Extremity/Trunk Assessment   Upper Extremity Assessment Upper Extremity Assessment: Overall WFL for tasks assessed    Lower Extremity Assessment Lower Extremity Assessment: Overall WFL for tasks assessed       Communication   Communication: No difficulties  Cognition Arousal/Alertness: Awake/alert Behavior During Therapy: WFL for tasks assessed/performed Overall Cognitive Status: Within Functional Limits for tasks assessed                                        General Comments General comments (skin integrity, edema, etc.): Performed canalith repositioning (Eply) for R posterior  canal BPPV, but noted rotary nystagmus lasting >40 sec and repeat modified hallpike with continued symptoms so performed Semont maneuver for cupulolithiasis x 2 (noted horizontal nystagmus in last position, so discontinued tx for today)    Exercises     Assessment/Plan    PT Assessment Patient needs continued PT services  PT Problem List Decreased mobility;Decreased activity tolerance;Decreased balance;Decreased knowledge of use of DME;Other (comment)(dizziness)       PT Treatment Interventions DME instruction;Therapeutic activities;Gait  training;Therapeutic exercise;Patient/family education;Balance training;Stair training;Functional mobility training;Other (comment)(vestibular rehab)    PT Goals (Current goals can be found in the Care Plan section)  Acute Rehab PT Goals Patient Stated Goal: To feel better, get more balance PT Goal Formulation: With patient Time For Goal Achievement: 01/05/18 Potential to Achieve Goals: Good    Frequency Min 3X/week   Barriers to discharge        Co-evaluation               AM-PAC PT "6 Clicks" Daily Activity  Outcome Measure Difficulty turning over in bed (including adjusting bedclothes, sheets and blankets)?: A Little Difficulty moving from lying on back to sitting on the side of the bed? : A Little Difficulty sitting down on and standing up from a chair with arms (e.g., wheelchair, bedside commode, etc,.)?: Unable Help needed moving to and from a bed to chair (including a wheelchair)?: A Little Help needed walking in hospital room?: A Little Help needed climbing 3-5 steps with a railing? : A Lot 6 Click Score: 15    End of Session   Activity Tolerance: Patient tolerated treatment well Patient left: in bed;with call bell/phone within reach   PT Visit Diagnosis: Unsteadiness on feet (R26.81);Other abnormalities of gait and mobility (R26.89);BPPV;Dizziness and giddiness (R42) BPPV - Right/Left : Right    Time: 1013-1110 PT Time Calculation (min) (ACUTE ONLY): 57 min   Charges:   PT Evaluation $PT Eval Moderate Complexity: 1 Mod PT Treatments $Gait Training: 8-22 mins $Neuromuscular Re-education: 8-22 mins $Canalith Rep Proc: 8-22 mins   PT G CodesSheran Odom:        Veronica Odom, [place]PT [phone] 12/29/2017   Veronica Odom 12/29/2017, 1:29 PM

## 2017-12-29 NOTE — Progress Notes (Signed)
Stroke Team Progress Note  Veronica Odom is an 42 y.o. female with a PMH of HTN who presents to the ED with acute onset vertigo, blurred vision and Right leg weakness. Patient was evaluated by Tele-Neurology. NIHSS was 0.  Neurology asked to formally evaluate patient.  All imaging thus far negative for acute findings.According to the patient she had an acute onset of dizziness, blurred vision and one episode of nausea/vomiting while sitting at her desk at work this morning. She also noticed some Right leg weakness at the same time. She states the dizziness is worse with head movement. She also tells me that her Right leg is chronically weak s/p knee surgery and she had noticed some fluid build up on her knee prior to work. On arrival to the ED patient complained of a 6/10 H/A. At the time of this examination, all of the patients presentation symptoms have resolved. She states she is complaint with her B/P medications but has not seen her PCP in quite some time. She does not monitor her B/P readings at home.She denied any recent injury, illness or travel. She denies any CP or SOB. No further episodes of N/V and no diarrhea. She denies any hormone therapy. She denies any daily ASA therapy. Denies any hx of Migraines. Denies any family history of CVA. Date last known well: Date: 12/28/2017 Time last known well: Time: 07:15 tPA Given: No: NIHSS = 0 Modified Rankin: Rankin Score=0 NIH Stroke Scale: 0   SUBJECTIVE  Patient is standing at the sink washing her face. She states her dizziness is improved but not back to baseline. She does admit now that dizziness seems to be more when she turns her head and neck to the right side. She has been seen by physical therapists who have diagnosed her with benign paroxysmal positional vertigo- R BPPV.  She demonstrated R lateral nystagmus in room light worse with R gaze and has R rotary nystagmus with modified hall pike lasting >40 sec.  Treated x 1 with Eply, then x 2  with Semont maneuver for cupulolithiasis. OBJECTIVE Most recent Vital Signs: Temp: 97.9 F (36.6 C) (04/01 1436) Temp Source: Oral (04/01 1436) BP: 127/83 (04/01 1436) Pulse Rate: 81 (04/01 1436) Respiratory Rate: 18 O2 Saturdation: 99%  CBG (last 3)  Recent Labs    12/28/17 0907  GLUCAP 116*    Diet: Diet Heart Room service appropriate? Yes; Fluid consistency: Thin    Activity: Up with assistance  VTE Prophylaxis:  Not needed as not a stroke  Studies: Results for orders placed or performed during the hospital encounter of 12/28/17 (from the past 24 hour(s))  HIV antibody (Routine Testing)     Status: None   Collection Time: 12/28/17  7:10 PM  Result Value Ref Range   HIV Screen 4th Generation wRfx Non Reactive Non Reactive  CBC     Status: None   Collection Time: 12/28/17  7:10 PM  Result Value Ref Range   WBC 5.3 4.0 - 10.5 K/uL   RBC 4.03 3.87 - 5.11 MIL/uL   Hemoglobin 12.6 12.0 - 15.0 g/dL   HCT 64.4 03.4 - 74.2 %   MCV 96.3 78.0 - 100.0 fL   MCH 31.3 26.0 - 34.0 pg   MCHC 32.5 30.0 - 36.0 g/dL   RDW 59.5 63.8 - 75.6 %   Platelets 255 150 - 400 K/uL  Creatinine, serum     Status: None   Collection Time: 12/28/17  7:10 PM  Result Value Ref  Range   Creatinine, Ser 0.83 0.44 - 1.00 mg/dL   GFR calc non Af Amer >60 >60 mL/min   GFR calc Af Amer >60 >60 mL/min  Basic metabolic panel     Status: Abnormal   Collection Time: 12/29/17  5:59 AM  Result Value Ref Range   Sodium 138 135 - 145 mmol/L   Potassium 4.2 3.5 - 5.1 mmol/L   Chloride 106 101 - 111 mmol/L   CO2 24 22 - 32 mmol/L   Glucose, Bld 105 (H) 65 - 99 mg/dL   BUN 10 6 - 20 mg/dL   Creatinine, Ser 1.61 0.44 - 1.00 mg/dL   Calcium 9.3 8.9 - 09.6 mg/dL   GFR calc non Af Amer >60 >60 mL/min   GFR calc Af Amer >60 >60 mL/min   Anion gap 8 5 - 15  TSH     Status: None   Collection Time: 12/29/17  9:55 AM  Result Value Ref Range   TSH 0.555 0.350 - 4.500 uIU/mL  Lipid panel     Status: Abnormal    Collection Time: 12/29/17  9:55 AM  Result Value Ref Range   Cholesterol 208 (H) 0 - 200 mg/dL   Triglycerides 045 (H) <150 mg/dL   HDL 43 >40 mg/dL   Total CHOL/HDL Ratio 4.8 RATIO   VLDL 39 0 - 40 mg/dL   LDL Cholesterol 981 (H) 0 - 99 mg/dL  Hemoglobin X9J     Status: Abnormal   Collection Time: 12/29/17  9:55 AM  Result Value Ref Range   Hgb A1c MFr Bld 4.6 (L) 4.8 - 5.6 %   Mean Plasma Glucose 85.32 mg/dL     Mr Angiogram Head Wo Contrast  Result Date: 12/28/2017 CLINICAL DATA:  Sudden onset of dizziness with vomiting and blurred vision that began with weakness in the right leg. EXAM: MRI HEAD WITHOUT AND WITH CONTRAST MRA HEAD WITHOUT CONTRAST MRA NECK WITHOUT AND WITH CONTRAST TECHNIQUE: Multiplanar, multiecho pulse sequences of the brain and surrounding structures were obtained without and with intravenous contrast. Angiographic images of the Circle of Willis were obtained using MRA technique without intravenous contrast. Angiographic images of the neck were obtained using MRA technique without and with intravenous contrast. Carotid stenosis measurements (when applicable) are obtained utilizing NASCET criteria, using the distal internal carotid diameter as the denominator. CONTRAST:  15mL MULTIHANCE GADOBENATE DIMEGLUMINE 529 MG/ML IV SOLN COMPARISON:  Head CT from earlier today FINDINGS: MRI HEAD FINDINGS Brain: No acute infarction, hemorrhage, hydrocephalus, extra-axial collection or mass lesion. 4 or 5 FLAIR hyperintensities in the cerebral white matter, nonspecific but usually from old microvascular insults in this patient with history of hypertension. No specific demyelinating pattern. Normal brain volume. The cerebellar tonsils project 4-5 mm below the foramen magnum, but no pointing or definite foramen magnum stenosis to imply Chiari 1 malformation Vascular: Normal flow voids and vascular enhancements. Arterial findings below. Skull and upper cervical spine: No evidence of marrow  lesion Sinuses/Orbits: Negative MRA HEAD FINDINGS Mild motion artifact. No branch occlusion, beading, aneurysm or stenosis. MRA NECK FINDINGS Antegrade flow in both carotid and vertebral circulation by time-of-flight imaging. No detected incidental soft tissue finding Normal appearance of the aortic arch. Three vessel branching pattern. There is mild indistinctness of the distal right vertebral artery that is attributed to superimposed venous plexus enhancement based on the acquisition images. Allowing for this there is no beading or stenosis throughout the bilateral carotid and vertebral circulation. IMPRESSION: Brain MRI: 1. No acute  finding or explanation for symptoms. 2. Few small signal abnormalities in the cerebral white matter which may be related patient's history of hypertension. Intracranial and neck MRA: Negative. Electronically Signed   By: Marnee SpringJonathon  Watts M.D.   On: 12/28/2017 13:41   Mr Angiogram Neck W Or Wo Contrast  Result Date: 12/28/2017 CLINICAL DATA:  Sudden onset of dizziness with vomiting and blurred vision that began with weakness in the right leg. EXAM: MRI HEAD WITHOUT AND WITH CONTRAST MRA HEAD WITHOUT CONTRAST MRA NECK WITHOUT AND WITH CONTRAST TECHNIQUE: Multiplanar, multiecho pulse sequences of the brain and surrounding structures were obtained without and with intravenous contrast. Angiographic images of the Circle of Willis were obtained using MRA technique without intravenous contrast. Angiographic images of the neck were obtained using MRA technique without and with intravenous contrast. Carotid stenosis measurements (when applicable) are obtained utilizing NASCET criteria, using the distal internal carotid diameter as the denominator. CONTRAST:  15mL MULTIHANCE GADOBENATE DIMEGLUMINE 529 MG/ML IV SOLN COMPARISON:  Head CT from earlier today FINDINGS: MRI HEAD FINDINGS Brain: No acute infarction, hemorrhage, hydrocephalus, extra-axial collection or mass lesion. 4 or 5 FLAIR  hyperintensities in the cerebral white matter, nonspecific but usually from old microvascular insults in this patient with history of hypertension. No specific demyelinating pattern. Normal brain volume. The cerebellar tonsils project 4-5 mm below the foramen magnum, but no pointing or definite foramen magnum stenosis to imply Chiari 1 malformation Vascular: Normal flow voids and vascular enhancements. Arterial findings below. Skull and upper cervical spine: No evidence of marrow lesion Sinuses/Orbits: Negative MRA HEAD FINDINGS Mild motion artifact. No branch occlusion, beading, aneurysm or stenosis. MRA NECK FINDINGS Antegrade flow in both carotid and vertebral circulation by time-of-flight imaging. No detected incidental soft tissue finding Normal appearance of the aortic arch. Three vessel branching pattern. There is mild indistinctness of the distal right vertebral artery that is attributed to superimposed venous plexus enhancement based on the acquisition images. Allowing for this there is no beading or stenosis throughout the bilateral carotid and vertebral circulation. IMPRESSION: Brain MRI: 1. No acute finding or explanation for symptoms. 2. Few small signal abnormalities in the cerebral white matter which may be related patient's history of hypertension. Intracranial and neck MRA: Negative. Electronically Signed   By: Marnee SpringJonathon  Watts M.D.   On: 12/28/2017 13:41   Mr Laqueta JeanBrain W And Wo Contrast  Result Date: 12/28/2017 CLINICAL DATA:  Sudden onset of dizziness with vomiting and blurred vision that began with weakness in the right leg. EXAM: MRI HEAD WITHOUT AND WITH CONTRAST MRA HEAD WITHOUT CONTRAST MRA NECK WITHOUT AND WITH CONTRAST TECHNIQUE: Multiplanar, multiecho pulse sequences of the brain and surrounding structures were obtained without and with intravenous contrast. Angiographic images of the Circle of Willis were obtained using MRA technique without intravenous contrast. Angiographic images of the  neck were obtained using MRA technique without and with intravenous contrast. Carotid stenosis measurements (when applicable) are obtained utilizing NASCET criteria, using the distal internal carotid diameter as the denominator. CONTRAST:  15mL MULTIHANCE GADOBENATE DIMEGLUMINE 529 MG/ML IV SOLN COMPARISON:  Head CT from earlier today FINDINGS: MRI HEAD FINDINGS Brain: No acute infarction, hemorrhage, hydrocephalus, extra-axial collection or mass lesion. 4 or 5 FLAIR hyperintensities in the cerebral white matter, nonspecific but usually from old microvascular insults in this patient with history of hypertension. No specific demyelinating pattern. Normal brain volume. The cerebellar tonsils project 4-5 mm below the foramen magnum, but no pointing or definite foramen magnum stenosis to imply Chiari 1  malformation Vascular: Normal flow voids and vascular enhancements. Arterial findings below. Skull and upper cervical spine: No evidence of marrow lesion Sinuses/Orbits: Negative MRA HEAD FINDINGS Mild motion artifact. No branch occlusion, beading, aneurysm or stenosis. MRA NECK FINDINGS Antegrade flow in both carotid and vertebral circulation by time-of-flight imaging. No detected incidental soft tissue finding Normal appearance of the aortic arch. Three vessel branching pattern. There is mild indistinctness of the distal right vertebral artery that is attributed to superimposed venous plexus enhancement based on the acquisition images. Allowing for this there is no beading or stenosis throughout the bilateral carotid and vertebral circulation. IMPRESSION: Brain MRI: 1. No acute finding or explanation for symptoms. 2. Few small signal abnormalities in the cerebral white matter which may be related patient's history of hypertension. Intracranial and neck MRA: Negative. Electronically Signed   By: Marnee Spring M.D.   On: 12/28/2017 13:41   Ct Head Code Stroke Wo Contrast`  Result Date: 12/28/2017 CLINICAL DATA:   Code stroke. Sudden onset dizziness with vomiting and blurred vision. Weakness in the right leg EXAM: CT HEAD WITHOUT CONTRAST TECHNIQUE: Contiguous axial images were obtained from the base of the skull through the vertex without intravenous contrast. COMPARISON:  None available FINDINGS: Brain: No evidence of acute infarction, hemorrhage, hydrocephalus, extra-axial collection or mass lesion/mass effect. Mildly low cerebellar tonsils projecting 3-4 mm below the foramen magnum. No pointing or high-grade foramen magnum stenosis to imply Chiari 1 malformation. Cervical MRI in 2011 with no syrinx. Vascular: No hyperdense vessel or unexpected calcification. Skull: Normal. Negative for fracture or focal lesion. Sinuses/Orbits: No acute finding. Other: These results were called by telephone at the time of interpretation on 12/28/2017 at 9:25 am to Dr. Lynden Oxford , who verbally acknowledged these results. ASPECTS [facility] Stroke Program Early CT Score) - Ganglionic level infarction (caudate, lentiform nuclei, internal capsule, insula, M1-M3 cortex): 7 - Supraganglionic infarction (M4-M6 cortex): 3 Total score (0-10 with 10 being normal): 10 IMPRESSION: No acute finding. ASPECTS is 10. Electronically Signed   By: Marnee Spring M.D.   On: 12/28/2017 09:26    Physical Exam:    Obese middle-aged African-American lady not in distress . Afebrile. Head is nontraumatic. Neck is supple without bruit.    Cardiac exam no murmur or gallop. Lungs are clear to auscultation. Distal pulses are well felt. Neurological Exam ;  Awake  Alert oriented x 3. Normal speech and language.eye movements full with slight nystagmus on right gaze..fundi were not visualized. Vision acuity and fields appear normal. Hearing is normal. Palatal movements are normal. Face symmetric. Tongue midline. Normal strength, tone, reflexes and coordination. Normal sensation. Gait deferred. ASSESSMENT Ms. Veronica Odom is a 42 y.o. female with   positional nystagmus likely benign paroxysmal positional vertigo. Neurovascular and imaging evaluation is unremarkable. Hospital day # 0  TREATMENT/PLAN No further stroke workup is necessary. Continue vestibular stabilization exercises as per physical therapy. Stroke team will sign off. Kindly call for questions. Greater than 50% time during this 25 minute visit was spent on counseling and coordination of care about her benign paroxysmal positional vertigo and answered questions.  Delia Heady, MD [facility] Pager: [phone] 12/29/2017 6:16 PM

## 2017-12-29 NOTE — Progress Notes (Signed)
Patient refused blood work this morning, per lab she is requesting "a line to draw blood off of", paged provider to make aware.

## 2017-12-29 NOTE — Progress Notes (Signed)
   Subjective: The patient was resting comfortably in her bed today point in the room.  She states that she has to maintain leftward sitting position approximately 30 degree angle to decrease the" dizzy" sensation.  This is worsened by lying flat, sitting up and with motion.  Patient denied nausea, vomiting, diarrhea, pain, or other concerning symptoms.  Appears please see the MRI was negative for acute changes but is inquisitive as to what to take him moving forward.  Objective:  Vital signs in last 24 hours: Vitals:   12/28/17 2043 12/28/17 2100 12/29/17 0500 12/29/17 0830  BP:  120/85 115/81   Pulse:  92 83   Resp:  18 18   Temp:  98.1 F (36.7 C) 97.9 F (36.6 C)   TempSrc:  Oral Oral   SpO2: 98% 100% 99% 100%  Weight:       ROS negative except as per HPI  Physical exam: General: No acute distress, Afebrile, Conversant Neuro: Alert and oriented x3 with no clear focal deficits. HEENT: There is notable right word gaze horizontal nystagmus Cardiovascular: RRR, no murmur auscultated, no rubs or gallops Pulmonary: Lungs Clear to auscultation bilaterally, there is no wheezing or crackles GI: Abdomen is soft, nontender, nondistended Musculoskeletal: Bilateral lower extremities are nonedematous, nontender time  Assessment/Plan:  Active Problems:   Acute peripheral vestibulopathy, left  Assessment: Veronica Odom is a 42 year old female who presents with acute onset prolonged dizziness.  She denies  the having previously experienced such a sensation in the past.  The MRI of the brain was negative for acute process. As per review this can be expected in up to 20% of posterior circulation strokes within the first 24 hours.  There is no current evidence of prolonged focal deficit in the patient has been able to ambulate with assistance continually progressing.  There was notable rightward gaze horizontal nystagmus on day 2 for admission.  At this time I feel that her condition is most likely  and secondary to acute vestibular syndrome should continue to improve with supportive care.  However, we will continue to complete a thorough stroke evaluation with a high false-negative probability of the MRI. The patient is currently stable at this time but continues to be evaluated for potential cerebrovascular event.   Acute peripheral vestibulopathy, left:  MRI of the brain negative for acute process. Symptomatically improving daily. Able to ambulate minimally with assistance but improving.  -Echocardiogram ordered -Hemoglobin A1c 4.6% -TSH 0.555 WNL's -Physical therapy to assist patient--Seen and evaluated by PT who recommending outpatient vestibular rehab. -Neurology following  HLD: Patient's lipid panel revealed hyperlipidemia with cholesterol of 208, tri 195, HDL 43, LDL of 126.  10 year ASCVD risk of <5%-no statin indicated. Will recommend lifestyle modifications  Hypertension: Patient's blood pressures remained within the normotensive range during his admission approximately 120 systolic this a.m. -Continue holding her amlodipine 10 mg nightly given her presenting symptoms and stable blood pressure -Lisinopril 20 mg daily  Asthma: No acute issues with regard to her story status. -Continue Proventil -Continue Pulmicort  Dispo: Anticipated discharge in approximately 1-2 day(s).   Lanelle BalHarbrecht, Maurine Mowbray, MD 12/29/2017, 2:24 PM Pager: Pager# [phone]

## 2017-12-29 NOTE — Progress Notes (Signed)
   12/29/17 0734  Vitals  Cardiac Rhythm NSR  Orthostatic Lying   BP- Lying 111/71  Orthostatic Sitting  BP- Sitting 113/75  Orthostatic Standing at 0 minutes  BP- Standing at 0 minutes 118/82  Orthostatic Standing at 3 minutes  BP- Standing at 3 minutes (!) 131/91  Pain Assessment  Pain Scale 0-10  Pain Score 0

## 2017-12-29 NOTE — Plan of Care (Signed)
  Problem: Activity: Goal: Risk for activity intolerance will decrease Outcome: Progressing   Problem: Safety: Goal: Ability to remain free from injury will improve Outcome: Progressing   Patient continues to have intermittent dizziness when wakes up from sleeping.  Patient denies any nausea or pain.  Patient was given her contacts. Refused lovenox educated on benefits of using and risk of blood clots.

## 2017-12-30 ENCOUNTER — Inpatient Hospital Stay (HOSPITAL_COMMUNITY): Payer: No Typology Code available for payment source

## 2017-12-30 DIAGNOSIS — I10 Essential (primary) hypertension: Secondary | ICD-10-CM

## 2017-12-30 LAB — ECHOCARDIOGRAM COMPLETE: Weight: 3329.83 oz

## 2017-12-30 MED ORDER — MECLIZINE HCL 25 MG PO TABS: 25.0000 mg | ORAL_TABLET | Freq: Two times a day (BID) | ORAL | 0 refills | Status: DC | PRN

## 2017-12-30 NOTE — Progress Notes (Signed)
   Subjective: The patient was unable to bed today upon entering the room.  She stated although she had decreased appetite she is feeling significantly improved from the prior day.  Dizziness has all but resolved, she states she is able to ambulate without difficulty, and feels ready to be discharged home.  Objective:  Vital signs in last 24 hours: Vitals:   12/29/17 1436 12/29/17 2006 12/29/17 2059 12/30/17 0636  BP: 127/83  (!) 127/92 126/85  Pulse: 81 88 86 78  Resp: 18 18 16 16   Temp: 97.9 F (36.6 C)  98.2 F (36.8 C) 98.8 F (37.1 C)  TempSrc: Oral  Oral Oral  SpO2: 99% 98% 100% 99%  Weight:       ROS negative except as per HPI  Physical exam: General: No acute distress, conversant, nondiaphoretic, afebrile Neuro: Alert and oriented x4, no focal deficits, gaze horizontal nystagmus improved barely observable transforming Cardiovascular: RRR, no rubs gallops murmurs auscultated Pulmonary: The patient's lung fields are clear to auscultation bilaterally GI: Abdominal exam bowel sounds are normal, nontender, nondistended Musculoskeletal: Bilateral distal extremities are nonedematous, nontender  Assessment/Plan:  Active Problems:   Acute peripheral vestibulopathy, left   Acute vestibular syndrome  Assessment: Veronica Odom is a 42 year old female who presents with acute onset but persisting dizziness.  She denies   having previously experienced such a sensation in the past.  The MRI of the brain was negative for acute process. As per review this can be expected in up to 20% of posterior circulation strokes within the first 24 hours.  There was no evidence of prolonged focal deficit and the patient was able to ambulate without assistance by day 3 of admission which improved significantly.  There was notable rightward gaze horizontal nystagmus on day 2 for admission which improved significantly by day 3.  At this time I feel that her condition is most likely secondary to acute  vestibular syndrome and should continue to improve with supportive care.    A thorough stroke evaluation was completed including echocardiogram. The patient is currently stable and cleared for discharge home today.  Acute peripheral vestibulopathy, left: The brain negative for acute process symptomatically resolved today.  Able to ambulate minimally without assistance and continues to improve.  As this is the primary admission diagnosis and as it has improved she is cleared for discharge home. -Echocardiogram completed results pending -Hemoglobin A1c 4.6%-no issues here -TSH 0.555 within normal limits - Vestibular physical therapy evaluate the patient and continue to work with the patient. -Neurology signed off as I believe this to be BPPV V she would benefit from for continued vestibular therapy.  HLD: As before the patient lipid panel revealed hyperlipidemia with cholesterol of 208, HDL 43 and LDL 126.  Given the ASCVD risk of less than 5% in conjunction with her age and other risk factors I would recommend lifestyle modifications on discharge.  Hypertension: His blood pressure remained within normotensive range during his admission. Her most recent BP was 126/85 which continues to be WNL's.  Asthma: No acute issues with regard to her asthma status. -Continue parenteral -Continue Pulmicort  Dispo: Anticipated discharge in approximately 0 day(s).   Veronica Odom, Veronica Haddaway, MD 12/30/2017, 6:59 AM Pager: Pager# [phone]

## 2017-12-30 NOTE — Progress Notes (Signed)
  Echocardiogram 2D Echocardiogram has been performed.  Veronica SavoyCasey Odom Veronica Odom 12/30/2017, 8:37 AM

## 2017-12-30 NOTE — Discharge Instructions (Signed)
[facility]  If any time his symptoms were to recur or you to develop new onset symptoms please contact her PCP and if not available returned to the emergency department.

## 2017-12-30 NOTE — Progress Notes (Signed)
Physical Therapy Treatment Patient Details Name: Veronica Odom MRN: [id number] DOB: Sep 22, 1976 Today's Date: 12/30/2017    History of Present Illness Veronica Odom is a 42 y.o f with asthma and hypertension who presents with dizziness, right leg weakness and vision changes.  MRI and CT negative for ishcemia, but positive for signal abnormality in cerebral white matter.  Neurology note reporting vertical nystagmus and positive cover/uncover test.    PT Comments    Patient with improved vertigo symptoms this session, but still positive for BPPV on R with rotary nystagmus lating >40 sec.  Treated with Semont then Eply maneuvers.  Patient educated in HEP for seated VOR for vestibular adaptation for hypofunction.  Continue to feel she will need outpatient follow up at d/c.   Follow Up Recommendations  Outpatient PT;Supervision - Intermittent     Equipment Recommendations  None recommended by PT    Recommendations for Other Services       Precautions / Restrictions Precautions Precautions: Fall    Mobility  Bed Mobility Overal bed mobility: Modified Independent                Transfers Overall transfer level: Modified independent   Transfers: Sit to/from Stand              Ambulation/Gait Ambulation/Gait assistance: Min guard;Supervision Ambulation Distance (Feet): 200 Feet Assistive device: None Gait Pattern/deviations: Step-through pattern;Decreased stride length     General Gait Details: decreased speed, see DGI   Stairs Stairs: Yes   Stair Management: One rail Right;Forwards;Two rails;Alternating pattern Number of Stairs: 3 General stair comments: assist for safety, slower, but no c/o increased dizziness  Wheelchair Mobility    Modified Rankin (Stroke Patients Only)       Balance Overall balance assessment: Needs assistance   Sitting balance-Leahy Scale: Normal       Standing balance-Leahy Scale: Good                   Standardized  Balance Assessment Standardized Balance Assessment : Dynamic Gait Index   Dynamic Gait Index Level Surface: Mild Impairment Change in Gait Speed: Mild Impairment Gait with Horizontal Head Turns: Mild Impairment Gait with Vertical Head Turns: Normal Gait and Pivot Turn: Mild Impairment Step Over Obstacle: Mild Impairment Step Around Obstacles: Normal Steps: Mild Impairment Total Score: 18      Cognition Arousal/Alertness: Awake/alert Behavior During Therapy: WFL for tasks assessed/performed Overall Cognitive Status: Within Functional Limits for tasks assessed                                        Exercises Other Exercises Other Exercises: seated x1 exercises with near target x 30s horizontal and vertical. Issued for HEP    General Comments General comments (skin integrity, edema, etc.): noted continued R rotary nystagmus in R modified hall pike position, treated with Semont manuever first as lasting >40 sec, then with Eply manuever.  Issued handouts of BPPV and Labyrinthitis from [employer].        Pertinent Vitals/Pain Pain Assessment: No/denies pain    Home Living                      Prior Function            PT Goals (current goals can now be found in the care plan section) Progress towards PT goals: Progressing toward goals  Frequency    Min 3X/week      PT Plan Current plan remains appropriate    Co-evaluation              AM-PAC PT "6 Clicks" Daily Activity  Outcome Measure  Difficulty turning over in bed (including adjusting bedclothes, sheets and blankets)?: A Little Difficulty moving from lying on back to sitting on the side of the bed? : A Little Difficulty sitting down on and standing up from a chair with arms (e.g., wheelchair, bedside commode, etc,.)?: A Little Help needed moving to and from a bed to chair (including a wheelchair)?: A Little Help needed walking in hospital room?: A  Little Help needed climbing 3-5 steps with a railing? : A Little 6 Click Score: 18    End of Session Equipment Utilized During Treatment: Gait belt Activity Tolerance: Patient tolerated treatment well Patient left: with call bell/phone within reach;in chair   PT Visit Diagnosis: Unsteadiness on feet (R26.81);Other abnormalities of gait and mobility (R26.89);BPPV;Dizziness and giddiness (R42) BPPV - Right/Left : Right     Time: 1010-1100 PT Time Calculation (min) (ACUTE ONLY): 50 min  Charges:  $Gait Training: 8-22 mins $Self Care/Home Management: 8-22 $Canalith Rep Proc: 8-22 mins                    G CodesSheran Odom:       Veronica Odom, [place]PT [phone] 12/30/2017    Veronica Odom 12/30/2017, 2:11 PM

## 2017-12-30 NOTE — Discharge Summary (Addendum)
Name: Veronica Odom MRN: [id number] DOB: 07-28-1976 42 y.o. PCP: Patria Mane, MD  Date of Admission: 12/28/2017  9:08 AM Date of Discharge: 12/30/2017 Attending Physician: Tyson Alias, *MD  Discharge Diagnosis: Active Problems:   Acute peripheral vestibulopathy, left   Acute vestibular syndrome   Discharge Medications: Allergies as of 12/30/2017      Reactions   Shellfish-derived Products Anaphylaxis   Ivp Dye [iodinated Diagnostic Agents] Hives, Itching   Latex Hives, Itching   Penicillins Hives, Swelling   Has patient had a PCN reaction causing immediate rash, facial/tongue/throat swelling, SOB or lightheadedness with hypotension: Yes Has patient had a PCN reaction causing severe rash involving mucus membranes or skin necrosis: Yes Has patient had a PCN reaction that required hospitalization: Yes Has patient had a PCN reaction occurring within the last 10 years: no If all of the above answers are "NO", then may proceed with Cephalosporin use.      Medication List    STOP taking these medications   acetaminophen 500 MG chewable tablet Commonly known as:  TYLENOL     TAKE these medications   amLODipine 10 MG tablet Commonly known as:  NORVASC Take 10 mg by mouth at bedtime.   beclomethasone 40 MCG/ACT inhaler Commonly known as:  QVAR Inhale 2 puffs into the lungs 2 (two) times daily.   fluticasone 50 MCG/ACT nasal spray Commonly known as:  FLONASE Place 1 spray into both nostrils daily.   meclizine 25 MG tablet Commonly known as:  ANTIVERT Take 1 tablet (25 mg total) by mouth 2 (two) times daily as needed for dizziness (Take two times daily as needed for dizziness).   meloxicam 15 MG tablet Commonly known as:  MOBIC Take 15 mg by mouth daily.   methocarbamol 500 MG tablet Commonly known as:  ROBAXIN Take 1 tablet (500 mg total) by mouth 2 (two) times daily.   naproxen 500 MG tablet Commonly known as:  NAPROSYN Take 1 tablet (500 mg total) by  mouth 2 (two) times daily.   PROVENTIL HFA 108 (90 Base) MCG/ACT inhaler Generic drug:  albuterol Inhale 1 puff into the lungs daily as needed for shortness of breath.       Disposition and follow-up:   Veronica Odom was discharged from [facility] in Good condition.  At the hospital follow up visit please address:  1.  Dizziness, resolution vs reoccurrence. Please assess symptoms.  HLD: please again discuss lifestyle modifications with the patient regarding her elevated cholesterol.  2.  Labs / imaging needed at time of follow-up: n/a  3.  Pending labs/ test needing follow-up: n/a  Follow-up Appointments: Follow-up Information    Patria Mane, MD. Schedule an appointment as soon as possible for a visit in 7 day(s).   Specialty:  Internal Medicine Why:  Please make an appointment ASAP for a 5-7 days hospital discharge follow-up with your PCP. This is essential.          Hospital Course by problem list: Active Problems:   Acute peripheral vestibulopathy, left   Acute vestibular syndrome   Acute peripheral vestibulopathy, left: Veronica Odom is a 42 year old female who presents with acute onset but persisting dizziness. She denied  having previously experienced such a sensation in the past. The MRI of the brain was negative for acute process. As per review this can be expectedin up to20%ofposterior circulation strokes within the first 24 hours. However, there was no evidence of prolonged focal deficit and the patient was able  to ambulate without assistance by day 3 of admission which improved significantly. Vestibular therapy was of possible benefit to the patient on the inpatient setting but not continued on discharge due to the prevailing diagnosis theory.There was notable rightward gaze horizontal nystagmus on day 2 admission which improved significantly by day 3.Her condition is most likely secondary to acute vestibular syndrome and should continue  to improve following discharge. A thorough stroke evaluation was completed including echocardiogram, HgbA1c (4.6%), TSH (0.555), Lipid profile as below and MRI.The patient is currently stable and cleared for discharge home today.  HLD: The patients lipid panel revealed hyperlipidemia with cholesterol of 208, HDL 43 and LDL 126.  Given the ASCVD risk of less than 5% in conjunction with her age and other risk factors I would recommend lifestyle modifications on discharge.   Hypertension: His blood pressure remained within normotensive range during his admission. No acute issues.  Asthma: No acute issues with regard to her asthma status  Discharge Vitals:   BP 126/85 (BP Location: Right Arm)   Pulse 78   Temp 98.8 F (37.1 C) (Oral)   Resp 16   Wt 208 lb 1.8 oz (94.4 kg)   LMP 12/24/2017   SpO2 99%   BMI 33.59 kg/m   Pertinent Labs, Studies, and Procedures:   Brain MRI, MRA Neck/head 12/28/2017: IMPRESSION: 1. No acute finding or explanation for symptoms. 2. Few small signal abnormalities in the cerebral white matter which may be related patient's history of hypertension.  Intracranial and neck MRA:--Negative.  Echo 12/30/2017: Study Conclusions - Left ventricle: The cavity size was normal. Systolic function was   normal. The estimated ejection fraction was in the range of 55%   to 60%. Wall motion was normal; there were no regional wall   motion abnormalities. Left ventricular diastolic function   parameters were normal.  BMP Latest Ref Rng & Units 12/29/2017 12/28/2017 12/28/2017  Glucose 65 - 99 mg/dL 621(H105(H) - 086(V115(H)  BUN 6 - 20 mg/dL 10 - 11  Creatinine 7.840.44 - 1.00 mg/dL 6.960.77 2.950.83 2.840.72  Sodium 135 - 145 mmol/L 138 - 138  Potassium 3.5 - 5.1 mmol/L 4.2 - 3.7  Chloride 101 - 111 mmol/L 106 - 105  CO2 22 - 32 mmol/L 24 - 22  Calcium 8.9 - 10.3 mg/dL 9.3 - 9.4   Vitals:   13/24/4002/10/18 2059 12/30/17 0636  BP: (!) 127/92 126/85  Pulse: 86 78  Resp: 16 16  Temp: 98.2  F (36.8 C) 98.8 F (37.1 C)  SpO2: 100% 99%   Discharge Instructions: Discharge Instructions    Call MD for:  persistant dizziness or light-headedness   Complete by:  As directed    Call MD for:  persistant nausea and vomiting   Complete by:  As directed    Diet - low sodium heart healthy   Complete by:  As directed    Increase activity slowly   Complete by:  As directed      Signed: Lanelle BalHarbrecht, Yanelly Cantrelle, MD 12/30/2017, 11:07 AM   Pager: Pager# [phone]

## 2018-01-08 ENCOUNTER — Ambulatory Visit: Payer: PRIVATE HEALTH INSURANCE | Attending: Student in an Organized Health Care Education/Training Program

## 2018-01-08 DIAGNOSIS — R2689 Other abnormalities of gait and mobility: Secondary | ICD-10-CM | POA: Diagnosis present

## 2018-01-08 DIAGNOSIS — R42 Dizziness and giddiness: Secondary | ICD-10-CM

## 2018-01-08 NOTE — Therapy (Signed)
[facility] [address] [place], [place], [address] Phone: [phone]   Fax:  [phone]  Physical Therapy Evaluation  Patient Details  Name: Veronica Odom MRN: [id number] Date of Birth: 12-Aug-1976 Referring Provider: Tyson Alias, MD   Encounter Date: 01/08/2018  PT End of Session - 01/08/18 1659    Visit Number  1    Number of Visits  13    Date for PT Re-Evaluation  03/05/18    PT Start Time  1402    PT Stop Time  1448    PT Time Calculation (min)  46 min    Activity Tolerance  Patient tolerated treatment well    Behavior During Therapy  [facility] for tasks assessed/performed       Past Medical History:  Diagnosis Date  . Asthma   . Hypertension     Past Surgical History:  Procedure Laterality Date  . BREAST SURGERY    . CHOLECYSTECTOMY    . KNEE SURGERY    . TUBAL LIGATION      There were no vitals filed for this visit.   Subjective Assessment - 01/08/18 1409    Subjective  Pt is a 42 y/o F who has been referred to OPPT by Tyson Alias, MD for dizziness and vestibular therapy on 12/30/2017. Admitted on for acute peripheral vestibulopathy (left) pt presented with Rt gaze horizontal nystagmus MRI of the brain was negative for acute process. 3/31 pt reports random on set of lightheadedness and dizziness , Rt. leg buckled and almost fell  and felt nauseous, cold and hot sweats. EMS activated and pt chose to transport to hospital on her own (no ambulance).  Pt reports improvements on Monday (4/8) and was able to walk with wall support.  Right now pt feels she has a bad hang over. And that her medications have helped minimally to this point. Pt is trying to return to work on 01/19/2018.    Pertinent History  HLD, athma,R patellar tracking disorder,     Limitations  Walking;Standing;House hold activities    Patient Stated Goals  to reduce dizziness symptoms    Currently in Pain?  No/denies          [facility] PT Assessment - 01/08/18 1400      Assessment   Medical Diagnosis  Dizziness    Referring Provider  Tyson Alias, MD    Onset Date/Surgical Date  12/30/17 date of referral    Hand Dominance  Right    Prior Therapy  PT for knee and shoulder. and inpatient      Precautions   Precautions  Fall    Precaution Comments  LATEX ALLERGY       Restrictions   Weight Bearing Restrictions  No      Balance Screen   Has the patient fallen in the past 6 months  No    Has the patient had a decrease in activity level because of a fear of falling?   Yes    Is the patient reluctant to leave their home because of a fear of falling?   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     Holiday representativeVocation Requirements  tall sitting and standing      Cognition   Overall Cognitive Status  Within Functional Limits for tasks assessed           Vestibular Assessment - 01/08/18 1400      Vestibular Assessment   General Observation  3915-44 years old pt reported history of migraines, no history of recent infection or illness       Symptom Behavior   Type of Dizziness  Spinning fuzzy- woozy    Frequency of Dizziness  constant sitting is ok     Duration of Dizziness  -- during movement 60-80 sec to stop     Aggravating Factors  Turning body quickly;Sit to stand;Supine to sit moveing. L sidelying is most aggravating     Relieving Factors  No known relieving factors      Occulomotor Exam    Occulomotor Alignment  Normal    Spontaneous  Absent    Gaze-induced  Absent    Smooth Pursuits  Saccades Lt. pursuit. Rt beating nystagmus with Rt. Pursuit    Saccades  Intact    Comment  HIT: Lt. saccadic tracking. 2 -3 beats to get back to center      Vestibulo-Occular Reflex   VOR 1 Head Only (x 1 viewing)  Right to left cervical rotation saccades noted       Positional Testing   Dix-Hallpike  Dix-Hallpike Right;Dix-Hallpike Left    Sidelying Test  --    Horizontal Canal Testing  Horizontal Canal Right;Horizontal Canal Left      Dix-Hallpike Right   Dix-Hallpike Right Duration  60+ secounds    Dix-Hallpike Right Symptoms  Upbeat, right rotatory nystagmus;Right nystagmus consistant 8/10 dizziness no change over 1.5 min      Dix-Hallpike Left   Dix-Hallpike Left Duration  20 sec    Dix-Hallpike Left Symptoms  Right nystagmus      Horizontal Canal Right   Horizontal Canal Right Duration  40 sec    Horizontal Canal Right Symptoms  Nystagmus R nystagmus w/ torsional component  7/10 dizziness      Horizontal Canal Left   Horizontal Canal Left Duration  20 sec    Horizontal Canal Left Symptoms  Ageotrophic;Nystagmus 5/10 dizziness          Objective measurements completed on examination: See above findings.              PT Education - 01/08/18 1656    Education provided  Yes    Education Details  PT POC, Vertigo and dizziness symptom reduction, medication impacts on PT assessments, falls risk reduciton,    Person(s) Educated  Patient    Methods  Explanation    Comprehension  Verbalized understanding;Returned demonstration       PT Short Term Goals - 01/08/18 1704      PT SHORT TERM GOAL #1   Title  Pt will be Independent with initial HEP program in order to facilitate progress outside of PT (02/05/2018 TARGET DATE FOR STGS)    Status  New    Target Date  02/05/18      PT SHORT TERM GOAL #2  Title  Pt will complete FGA and goals will be made to reflect  based on score    Status  New      PT SHORT TERM GOAL #3   Title  Pt will report methods used to manage her symptoms during acute exacerbations of dizziness     Status  New      PT SHORT TERM GOAL #4   Title  Patient will report a reduction in dizziness by >/= 50%    Status  New        PT Long Term Goals - 01/08/18 1706      PT LONG TERM GOAL #1   Title  Patient will demonstrate an  improvement on her FGA score of >/= 8 points to reduce falls risk (ALL TARGET DATES 03/05/2018)    Status  New    Target Date  03/05/18      PT LONG TERM GOAL #2   Title  Patient will demonstrate mild dizziness (</=2/10) with positional changes     Status  New      PT LONG TERM GOAL #3   Title  Pt will ambulate 500 ft outdoors over uneven surface in order to improve ambulatory ability in community mod I    Status  New             Plan - 01/08/18 1702    Clinical Impression Statement  Patient presented PT with moderate/high levels of dizziness exacerbated by positional changes. Upon Vestibular evaluation pt presented with the following (+) left Head impulse test and nystagmus with all positional testing positions. Patient also noted to have resting right beating nystagmus in supine with cervical flexion. Pt symptoms were reproduced withal oculomotor testing. During all positions patient reported increasing dizziness symptoms (R<L). Based on examination findings patient presentation is more consistent with neuritis and vestibular hypofunction.  Based on patients subjective reports will formally assess balance as indicated. In order for patient to make meaningful progress towards returning to work and meeting her functional mobility goals she will greatly benefit form skilled PT intervention to reduce the impact of her current symptoms during positional changes.    History and Personal Factors relevant to plan of care:  HLD, athma,R patellar tracking disorder, patient has been unable to work due to  severity of symptoms.     Clinical Presentation  Evolving    Clinical Presentation due to:  Chaning status of vestibular dysfunction    Clinical Decision Making  Moderate    Rehab Potential  Good    Clinical Impairments Affecting Rehab Potential  good motivation    PT Frequency  2x / week    PT Duration  4 weeks 1x week for following 4 weeks    PT Treatment/Interventions  Canalith Repostioning;ADLs/Self Care Home Management;Gait training;Stair training;Functional mobility training;Therapeutic activities;Therapeutic exercise;Balance training;Neuromuscular re-education;Patient/family education;Manual techniques;Vestibular;Passive range of motion;Energy conservation    PT Next Visit Plan  Establish HEP, Complete FGA if appropriate    Consulted and Agree with Plan of Care  Patient       Patient will benefit from skilled therapeutic intervention in order to improve the following deficits and impairments:  Dizziness, Decreased activity tolerance, Decreased balance, Difficulty walking, Impaired vision/preception  Visit Diagnosis: Other abnormalities of gait and mobility  Dizziness and giddiness     Problem List Patient Active Problem List   Diagnosis Date Noted  . Acute vestibular syndrome 12/29/2017  . Acute peripheral vestibulopathy, left 12/28/2017    Merton Border SPT 01/08/2018,  5:52 PM  [facility] [address] [place], [place], [address] Phone: [phone]   Fax:  [phone]  Name: Mahoganie Basher MRN: [id number] Date of Birth: 1975/11/11

## 2018-02-02 ENCOUNTER — Ambulatory Visit: Payer: No Typology Code available for payment source

## 2018-02-02 NOTE — Therapy (Signed)
[facility] [address] [place], [place], [address] Phone: [phone]   Fax:  [phone]  Patient Details  Name: Veronica Odom MRN: [id number] Date of Birth: 03-Nov-1975 Referring Provider:  No ref. provider found  Encounter Date: 02/02/2018  PHYSICAL THERAPY DISCHARGE SUMMARY  Visits from Start of Care: 1  Current functional level related to goals / functional outcomes: PT Long Term Goals - 01/08/18 1706      PT LONG TERM GOAL #1   Title  Patient will demonstrate an  improvement on her FGA score of >/= 8 points to reduce falls risk (ALL TARGET DATES 03/05/2018)    Status  New    Target Date  03/05/18      PT LONG TERM GOAL #2   Title  Patient will demonstrate mild dizziness (</=2/10) with positional changes     Status  New      PT LONG TERM GOAL #3   Title  Pt will ambulate 500 ft outdoors over uneven surface in order to improve ambulatory ability in community mod I    Status  New         Remaining deficits: Unknown, as pt never returned after initial PT eval. PT called pt regarding 2 no-shows and pt stated she forgot about appt's and doesn't need PT because dizziness has resolved and she's headed to the beach.   Education / Equipment: PT POC.  Plan: Patient agrees to discharge.  Patient goals were not met. Patient is being discharged due to not returning since the last visit.  ?????       Mathew Storck L 02/02/2018, 3:46 PM  [facility] [address] [place], [place], [address] Phone: [phone]   Fax:  [phone]   Geoffry Paradise, PT,DPT 02/02/18 3:47 PM Phone: [phone] Fax: [phone]

## 2018-02-04 ENCOUNTER — Encounter: Payer: Self-pay | Admitting: Neurology

## 2018-02-04 ENCOUNTER — Ambulatory Visit: Payer: Self-pay | Admitting: Neurology

## 2018-02-10 ENCOUNTER — Encounter: Payer: No Typology Code available for payment source | Admitting: Physical Therapy

## 2018-02-12 ENCOUNTER — Encounter: Payer: No Typology Code available for payment source | Admitting: Physical Therapy

## 2018-02-16 ENCOUNTER — Encounter: Payer: No Typology Code available for payment source | Admitting: Physical Therapy

## 2018-03-17 ENCOUNTER — Encounter: Payer: No Typology Code available for payment source | Admitting: Physical Therapy

## 2018-04-10 ENCOUNTER — Emergency Department (HOSPITAL_BASED_OUTPATIENT_CLINIC_OR_DEPARTMENT_OTHER)
Admission: EM | Admit: 2018-04-10 | Discharge: 2018-04-10 | Disposition: A | Discharge status: Home / Self Care | Payer: No Typology Code available for payment source | Attending: Emergency Medicine | Admitting: Emergency Medicine

## 2018-04-10 ENCOUNTER — Encounter (HOSPITAL_BASED_OUTPATIENT_CLINIC_OR_DEPARTMENT_OTHER): Payer: Self-pay | Admitting: *Deleted

## 2018-04-10 ENCOUNTER — Other Ambulatory Visit: Payer: Self-pay

## 2018-04-10 ENCOUNTER — Emergency Department (HOSPITAL_BASED_OUTPATIENT_CLINIC_OR_DEPARTMENT_OTHER): Payer: No Typology Code available for payment source

## 2018-04-10 DIAGNOSIS — K148 Other diseases of tongue: Secondary | ICD-10-CM | POA: Insufficient documentation

## 2018-04-10 DIAGNOSIS — R350 Frequency of micturition: Secondary | ICD-10-CM | POA: Insufficient documentation

## 2018-04-10 DIAGNOSIS — J45909 Unspecified asthma, uncomplicated: Secondary | ICD-10-CM | POA: Diagnosis not present

## 2018-04-10 DIAGNOSIS — M545 Low back pain: Secondary | ICD-10-CM | POA: Diagnosis present

## 2018-04-10 DIAGNOSIS — I10 Essential (primary) hypertension: Secondary | ICD-10-CM | POA: Diagnosis not present

## 2018-04-10 DIAGNOSIS — M5441 Lumbago with sciatica, right side: Secondary | ICD-10-CM | POA: Insufficient documentation

## 2018-04-10 DIAGNOSIS — Z79899 Other long term (current) drug therapy: Secondary | ICD-10-CM | POA: Diagnosis not present

## 2018-04-10 LAB — URINALYSIS, MICROSCOPIC (REFLEX): RBC / HPF: NONE SEEN RBC/hpf (ref 0–5)

## 2018-04-10 LAB — URINALYSIS, ROUTINE W REFLEX MICROSCOPIC
GLUCOSE, UA: NEGATIVE mg/dL
Hgb urine dipstick: NEGATIVE
KETONES UR: NEGATIVE mg/dL
LEUKOCYTES UA: NEGATIVE
NITRITE: NEGATIVE
PROTEIN: 30 mg/dL — AB
Specific Gravity, Urine: 1.025 (ref 1.005–1.030)
pH: 6 (ref 5.0–8.0)

## 2018-04-10 MED ORDER — PREDNISONE 20 MG PO TABS: 40.0000 mg | ORAL_TABLET | Freq: Every day | ORAL | 0 refills | Status: AC

## 2018-04-10 MED ORDER — MAGIC MOUTHWASH
10.0000 mL | Freq: Once | ORAL | Status: AC
  Administered 2018-04-10: 10 mL via ORAL
  Filled 2018-04-10: qty 10

## 2018-04-10 MED ORDER — PREDNISONE 50 MG PO TABS
50.0000 mg | ORAL_TABLET | Freq: Once | ORAL | Status: AC
Start: 2018-04-10 — End: 2018-04-10
  Administered 2018-04-10: 50 mg via ORAL
  Filled 2018-04-10: qty 1

## 2018-04-10 MED ORDER — MAGIC MOUTHWASH: 5.0000 mL | Freq: Three times a day (TID) | ORAL | 0 refills | Status: DC | PRN

## 2018-04-10 MED ORDER — KETOROLAC TROMETHAMINE 60 MG/2ML IM SOLN
30.0000 mg | Freq: Once | INTRAMUSCULAR | Status: AC
  Administered 2018-04-10: 30 mg via INTRAMUSCULAR
  Filled 2018-04-10: qty 2

## 2018-04-10 NOTE — ED Provider Notes (Signed)
[facility] EMERGENCY DEPARTMENT Provider Note   CSN: [id number] Arrival date & time: 04/10/18  1704     History   Chief Complaint Chief Complaint  Patient presents with  . Back Pain    HPI Veronica Odom is a 42 y.o. female.  HPI   Veronica Odom is a 42yo female with a history of asthma and hypertension who presents to the emergency department for evaluation of multiple complaints.  She states that she has had right lower back pain for the past 4 days now.  Pain feels "like a knot in my back which is sharp and shooting."  Pain radiates to the right posterior buttocks at times.  It is a 10/10 in severity and constant, although she reports that it is worsened with movement including bending at the hip, twisting or sitting for long periods in an uncomfortable position.  She has tried taking ibuprofen, muscle relaxer and Aleve at home without relief.  She denies recent injury or strenuous lifting activity.  Also reporting increased urinary frequency.  Denies fevers, chills, unexpected weight changes, night sweats, dysuria, hematuria, loss of bowel or bladder control, saddle anesthesia, numbness, weakness.  She is able to ambulate independently despite pain.  No prior history of cancer and she denies IV drug use.  Patient also reports that she has had soreness on her tongue for the past several days.  She noticed canker sores in her mouth which resolved, but she continues to have pain over her tongue.  She tried salt water gargles, hydrogen peroxide and baking soda and water without improvement.  States that salty foods worsen the pain.  She has never had this before.  No new medications.  Past Medical History:  Diagnosis Date  . Asthma   . Hypertension     Patient Active Problem List   Diagnosis Date Noted  . Acute vestibular syndrome 12/29/2017  . Acute peripheral vestibulopathy, left 12/28/2017    Past Surgical History:  Procedure Laterality Date  . BREAST SURGERY    .  CHOLECYSTECTOMY    . KNEE SURGERY    . TUBAL LIGATION       OB History   None      Home Medications    Prior to Admission medications   Medication Sig Start Date End Date Taking? Authorizing Provider  albuterol (PROVENTIL HFA) 108 (90 Base) MCG/ACT inhaler Inhale 1 puff into the lungs daily as needed for shortness of breath. 12/19/16   [provider]  amLODipine (NORVASC) 10 MG tablet Take 10 mg by mouth at bedtime.     [provider]  beclomethasone (QVAR) 40 MCG/ACT inhaler Inhale 2 puffs into the lungs 2 (two) times daily.    [provider]  etodolac (LODINE) 200 MG capsule Take 200 mg by mouth every 8 (eight) hours.    [provider]  fluticasone (FLONASE) 50 MCG/ACT nasal spray Place 1 spray into both nostrils daily. 12/13/16   [provider]  meclizine (ANTIVERT) 25 MG tablet Take 1 tablet (25 mg total) by mouth 2 (two) times daily as needed for dizziness (Take two times daily as needed for dizziness). 12/30/17   Lanelle Bal, MD  meloxicam (MOBIC) 15 MG tablet Take 15 mg by mouth daily.    [provider]  methocarbamol (ROBAXIN) 500 MG tablet Take 1 tablet (500 mg total) by mouth 2 (two) times daily. 01/08/17   Mabe, Latanya Maudlin, MD  naproxen (NAPROSYN) 500 MG tablet Take 1 tablet (500 mg  total) by mouth 2 (two) times daily. 01/08/17   Mabe, Latanya Maudlin, MD    Family History No family history on file.  Social History Social History   Tobacco Use  . Smoking status: Never Smoker  . Smokeless tobacco: Never Used  Substance Use Topics  . Alcohol use: Yes    Comment: occ  . Drug use: No     Allergies   Shellfish-derived products; Ivp dye [iodinated diagnostic agents]; Latex; and Penicillins   Review of Systems Review of Systems  Constitutional: Negative for chills, diaphoresis, fever and unexpected weight change.  HENT: Positive for mouth sores. Negative for trouble swallowing and voice change.   Respiratory:  Negative for shortness of breath.   Cardiovascular: Negative for chest pain.  Gastrointestinal: Negative for vomiting.  Genitourinary: Positive for frequency. Negative for difficulty urinating and dysuria.  Musculoskeletal: Positive for back pain (right lower). Negative for gait problem.  Skin: Negative for rash.  Neurological: Negative for weakness and numbness.  Psychiatric/Behavioral: Negative for agitation.     Physical Exam Updated Vital Signs BP (!) 140/106   Pulse 100   Temp 98.3 F (36.8 C) (Oral)   Resp 18   Ht 5\' 6"  (1.676 m)   Wt 93.4 kg (206 lb)   LMP 03/14/2018   SpO2 100%   BMI 33.25 kg/m   Physical Exam  Constitutional: She is oriented to person, place, and time. She appears well-developed and well-nourished. No distress.  No acute distress, non-toxic appearing.   HENT:  Head: Normocephalic and atraumatic.  Mucous membranes moist. One small 2mm ulceration over the left side of the tongue which is tender to palpation. No ulcers noted over the buccal mucosa or gum line. No oropharyngeal erythema. Airway patent.   Eyes: Right eye exhibits no discharge. Left eye exhibits no discharge.  Pulmonary/Chest: Effort normal. No respiratory distress.  Abdominal: Soft. Bowel sounds are normal.  No CVA tenderness.  Musculoskeletal:  Tender to palpation over several spinous processes of the lumbar spine as well as right-sided paraspinal muscles and right SI joint.  No overlying rash or bruising.  Strength 5/5 in bilateral knee flexion/extension and ankle dorsiflexion/plantarflexion.  DP pulses 1+ and symmetric bilaterally.  Neurological: She is alert and oriented to person, place, and time. Coordination normal.  Distal sensation to light touch intact in bilateral lower extremities.  Gait normal and coordination and balance.  Skin: Skin is warm and dry. She is not diaphoretic.  Psychiatric: She has a normal mood and affect. Her behavior is normal.  Nursing note and vitals  reviewed.    ED Treatments / Results  Labs (all labs ordered are listed, but only abnormal results are displayed) Labs Reviewed  URINALYSIS, ROUTINE W REFLEX MICROSCOPIC - Abnormal; Notable for the following components:      Result Value   Bilirubin Urine SMALL (*)    Protein, ur 30 (*)    All other components within normal limits  URINALYSIS, MICROSCOPIC (REFLEX) - Abnormal; Notable for the following components:   Bacteria, UA RARE (*)    All other components within normal limits    EKG None  Radiology Dg Lumbar Spine Complete  Result Date: 04/10/2018 CLINICAL DATA:  Low back pain x4 days. EXAM: LUMBAR SPINE - COMPLETE 4+ VIEW COMPARISON:  CT AP 07/11/2015 FINDINGS: There are 5 non ribbed lumbar vertebrae in normal lordosis. Slight disc space narrowing is seen at all levels of the lumbar spine with associated facet joint space narrowing and sclerosis at L4-5 and  L5-S1. No pars defects or listhesis. No acute fracture or suspicious osseous lesions. Pelvic phlebolith is noted the left hemipelvis. Degenerative joint space narrowing of the included hips bilaterally. IMPRESSION: Slight disc space narrowing at all levels of the lumbar spine with lower lumbar facet arthropathy. No acute osseous abnormality. Electronically Signed   By: Tollie Ethavid  Kwon M.D.   On: 04/10/2018 18:27    Procedures Procedures (including critical care time)  Medications Ordered in ED Medications  ketorolac (TORADOL) injection 30 mg (30 mg Intramuscular Given 04/10/18 1829)  predniSONE (DELTASONE) tablet 50 mg (50 mg Oral Given 04/10/18 1829)  magic mouthwash (10 mLs Oral Given 04/10/18 1829)     Initial Impression / Assessment and Plan / ED Course  I have reviewed the triage vital signs and the nursing notes.  Pertinent labs & imaging results that were available during my care of the patient were reviewed by me and considered in my medical decision making (see chart for details).     Patient presents with  multiple complaints  Back Pain Patient with back pain which is reproducible on exam, worsened with movement. No neurological deficits and normal neuro exam.  Lumbar spine xray with degenerative changes, no acute abnormality.  . Patient also reports increased urinary frequency, UA without evidence of infection. Symptoms consistent with lumbago. Patient can walk and has no loss of bowel or bladder control. No concern for cauda equina given exam findings.  No fever, night sweats, weight loss, h/o cancer, IVDU. RICE protocol and pain medicine indicated and discussed with patient.   Mouth Sore Exam consistent with small aphthous ulcer. Will treat pain with magic mouthwash and have patient follow up with her PCP as needed if symptoms are not improving.   Discussed reasons to return to the ER and patient agrees and appears reliable.   Final Clinical Impressions(s) / ED Diagnoses   Final diagnoses:  Acute right-sided low back pain with right-sided sciatica    ED Discharge Orders        Ordered    magic mouthwash SOLN  3 times daily PRN     04/10/18 1910    predniSONE (DELTASONE) 20 MG tablet  Daily     04/10/18 1910       Lawrence MarseillesShrosbree, Emily J, PA-C 04/10/18 1911    Loren RacerYelverton, David, MD 04/10/18 [id number]

## 2018-04-10 NOTE — ED Triage Notes (Addendum)
Lower back pain x 4 days. Worse with movement. She also has mouth ulcers she needs medication for.

## 2018-04-10 NOTE — Discharge Instructions (Addendum)
Please take mouthwash 3 times a day as needed at home for mouth pain.  Follow-up with your regular doctor in a week if your sore is not improving.  In terms of your back pain, continue using heat.  Avoid heavy lifting.  Take prednisone for the next 4 days, you already got your first dose here in the ER so you do not need to take this until tomorrow.  Return to the ER if you have any new or concerning symptoms like numbness, weakness, loss of bowel or bladder control.

## 2018-07-09 ENCOUNTER — Encounter (HOSPITAL_BASED_OUTPATIENT_CLINIC_OR_DEPARTMENT_OTHER): Payer: Self-pay

## 2018-07-09 ENCOUNTER — Emergency Department (HOSPITAL_BASED_OUTPATIENT_CLINIC_OR_DEPARTMENT_OTHER): Payer: No Typology Code available for payment source

## 2018-07-09 ENCOUNTER — Emergency Department (HOSPITAL_BASED_OUTPATIENT_CLINIC_OR_DEPARTMENT_OTHER)
Admission: EM | Admit: 2018-07-09 | Discharge: 2018-07-09 | Disposition: A | Discharge status: Home / Self Care | Payer: No Typology Code available for payment source | Attending: Emergency Medicine | Admitting: Emergency Medicine

## 2018-07-09 ENCOUNTER — Other Ambulatory Visit: Payer: Self-pay

## 2018-07-09 DIAGNOSIS — Z91040 Latex allergy status: Secondary | ICD-10-CM | POA: Insufficient documentation

## 2018-07-09 DIAGNOSIS — Z79899 Other long term (current) drug therapy: Secondary | ICD-10-CM | POA: Insufficient documentation

## 2018-07-09 DIAGNOSIS — I10 Essential (primary) hypertension: Secondary | ICD-10-CM | POA: Insufficient documentation

## 2018-07-09 DIAGNOSIS — J45901 Unspecified asthma with (acute) exacerbation: Secondary | ICD-10-CM

## 2018-07-09 DIAGNOSIS — J40 Bronchitis, not specified as acute or chronic: Secondary | ICD-10-CM

## 2018-07-09 MED ORDER — IPRATROPIUM BROMIDE 0.02 % IN SOLN
0.5000 mg | Freq: Once | RESPIRATORY_TRACT | Status: AC
  Administered 2018-07-09: 0.5 mg via RESPIRATORY_TRACT
  Filled 2018-07-09: qty 2.5

## 2018-07-09 MED ORDER — BENZONATATE 100 MG PO CAPS: 100.0000 mg | ORAL_CAPSULE | Freq: Three times a day (TID) | ORAL | 0 refills | Status: DC

## 2018-07-09 MED ORDER — IBUPROFEN 200 MG PO TABS
600.0000 mg | ORAL_TABLET | Freq: Once | ORAL | Status: AC
  Administered 2018-07-09: 600 mg via ORAL
  Filled 2018-07-09: qty 1

## 2018-07-09 MED ORDER — ALBUTEROL SULFATE (2.5 MG/3ML) 0.083% IN NEBU
5.0000 mg | INHALATION_SOLUTION | Freq: Once | RESPIRATORY_TRACT | Status: AC
  Administered 2018-07-09: 5 mg via RESPIRATORY_TRACT
  Filled 2018-07-09: qty 6

## 2018-07-09 MED ORDER — AZITHROMYCIN 250 MG PO TABS: 250.0000 mg | ORAL_TABLET | Freq: Every day | ORAL | 0 refills | Status: DC

## 2018-07-09 MED ORDER — PREDNISONE 20 MG PO TABS
40.0000 mg | ORAL_TABLET | Freq: Once | ORAL | Status: AC
  Administered 2018-07-09: 40 mg via ORAL
  Filled 2018-07-09: qty 2

## 2018-07-09 MED ORDER — PREDNISONE 20 MG PO TABS: 40.0000 mg | ORAL_TABLET | Freq: Every day | ORAL | 0 refills | Status: DC

## 2018-07-09 MED FILL — predniSONE 20 MG TABS: 20 | 4 days supply | Qty: 8 | Fill #0

## 2018-07-09 MED FILL — AZITHROMYCIN 250 MG TABLET: 250 | 5 days supply | Qty: 6 | Fill #0

## 2018-07-09 MED FILL — BENZONATATE 100 MG CAPSULE: 100 | 5 days supply | Qty: 15 | Fill #0

## 2018-07-09 NOTE — ED Provider Notes (Signed)
[facility] EMERGENCY DEPARTMENT Provider Note   CSN: [id number] Arrival date & time: 07/09/18  1511     History   Chief Complaint Chief Complaint  Patient presents with  . URI    HPI Veronica Odom is a 42 y.o. female.  Patient with history of asthma on Qvar and albuterol presents with complaint of continued nasal congestion and cough.  Symptoms started about a week ago with a sore throat and nasal congestion.  Those symptoms have improved however cough has worsened.  It is nonproductive.  She denies any fevers or chest pain.  She has had chest tightness related to her asthma and cough which she thinks is flared up.  In the past she improved with steroids, antibiotics, breathing treatments.  She has been using Alka-Seltzer cough and cold medication without much improvement. The onset of this condition was acute. The course is constant. Aggravating factors: none. Alleviating factors: none.       Past Medical History:  Diagnosis Date  . Asthma   . Hypertension     Patient Active Problem List   Diagnosis Date Noted  . Acute vestibular syndrome 12/29/2017  . Acute peripheral vestibulopathy, left 12/28/2017    Past Surgical History:  Procedure Laterality Date  . BREAST SURGERY    . CHOLECYSTECTOMY    . KNEE SURGERY    . TUBAL LIGATION       OB History   None      Home Medications    Prior to Admission medications   Medication Sig Start Date End Date Taking? Authorizing Provider  albuterol (PROVENTIL HFA) 108 (90 Base) MCG/ACT inhaler Inhale 1 puff into the lungs daily as needed for shortness of breath. 12/19/16   [provider]  amLODipine (NORVASC) 10 MG tablet Take 10 mg by mouth at bedtime.     [provider]  beclomethasone (QVAR) 40 MCG/ACT inhaler Inhale 2 puffs into the lungs 2 (two) times daily.    [provider]  etodolac (LODINE) 200 MG capsule Take 200 mg by mouth every 8 (eight) hours.    [provider]    fluticasone (FLONASE) 50 MCG/ACT nasal spray Place 1 spray into both nostrils daily. 12/13/16   [provider]  magic mouthwash SOLN Take 5 mLs by mouth 3 (three) times daily as needed for mouth pain. 04/10/18   Kellie Shropshire, PA-C  meclizine (ANTIVERT) 25 MG tablet Take 1 tablet (25 mg total) by mouth 2 (two) times daily as needed for dizziness (Take two times daily as needed for dizziness). 12/30/17   Lanelle Bal, MD  meloxicam (MOBIC) 15 MG tablet Take 15 mg by mouth daily.    [provider]  methocarbamol (ROBAXIN) 500 MG tablet Take 1 tablet (500 mg total) by mouth 2 (two) times daily. 01/08/17   Mabe, Latanya Maudlin, MD  naproxen (NAPROSYN) 500 MG tablet Take 1 tablet (500 mg total) by mouth 2 (two) times daily. 01/08/17   Mabe, Latanya Maudlin, MD    Family History No family history on file.  Social History Social History   Tobacco Use  . Smoking status: Never Smoker  . Smokeless tobacco: Never Used  Substance Use Topics  . Alcohol use: Yes    Comment: occ  . Drug use: No     Allergies   Shellfish-derived products; Ivp dye [iodinated diagnostic agents]; Latex; and Penicillins   Review of Systems Review of Systems  Constitutional: Negative for chills, fatigue and fever.  HENT: Positive  for congestion and rhinorrhea. Negative for ear pain, sinus pressure and sore throat (improved).   Eyes: Negative for redness.  Respiratory: Positive for cough, chest tightness, shortness of breath and wheezing.   Gastrointestinal: Negative for abdominal pain, diarrhea, nausea and vomiting.  Genitourinary: Negative for dysuria.  Musculoskeletal: Negative for myalgias and neck stiffness.  Skin: Negative for rash.  Neurological: Negative for headaches.  Hematological: Negative for adenopathy.     Physical Exam Updated Vital Signs BP (!) 137/96 (BP Location: Left Arm)   Pulse 96   Temp 98.6 F (37 C) (Oral)   Resp 20   Ht 5\' 5"  (1.651 m)   Wt 90.7 kg   LMP  07/05/2018   SpO2 97%   BMI 33.28 kg/m   Physical Exam  Constitutional: She appears well-developed and well-nourished.  HENT:  Head: Normocephalic and atraumatic.  Right Ear: Tympanic membrane, external ear and ear canal normal.  Left Ear: Tympanic membrane, external ear and ear canal normal.  Nose: Mucosal edema present. No rhinorrhea.  Mouth/Throat: Oropharynx is clear and moist. No oropharyngeal exudate, posterior oropharyngeal edema or posterior oropharyngeal erythema.  Eyes: Conjunctivae are normal. Right eye exhibits no discharge. Left eye exhibits no discharge.  Neck: Normal range of motion. Neck supple.  Cardiovascular: Normal rate, regular rhythm and normal heart sounds.  Pulmonary/Chest: Effort normal. She has decreased breath sounds (Generalized). She has no wheezes.  Abdominal: Soft. There is no tenderness.  Neurological: She is alert.  Skin: Skin is warm and dry.  Psychiatric: She has a normal mood and affect.  Nursing note and vitals reviewed.    ED Treatments / Results  Labs (all labs ordered are listed, but only abnormal results are displayed) Labs Reviewed - No data to display  EKG None  Radiology No results found.  Procedures Procedures (including critical care time)  Medications Ordered in ED Medications  ibuprofen (ADVIL,MOTRIN) tablet 600 mg (has no administration in time range)  albuterol (PROVENTIL) (2.5 MG/3ML) 0.083% nebulizer solution 5 mg (5 mg Nebulization Given 07/09/18 1537)  ipratropium (ATROVENT) nebulizer solution 0.5 mg (0.5 mg Nebulization Given 07/09/18 1542)  predniSONE (DELTASONE) tablet 40 mg (40 mg Oral Given 07/09/18 1536)     Initial Impression / Assessment and Plan / ED Course  I have reviewed the triage vital signs and the nursing notes.  Pertinent labs & imaging results that were available during my care of the patient were reviewed by me and considered in my medical decision making (see chart for details).      Patient seen and examined. Medications ordered.   Vital signs reviewed and are as follows: BP (!) 137/96 (BP Location: Left Arm)   Pulse 96   Temp 98.6 F (37 C) (Oral)   Resp 20   Ht 5\' 5"  (1.651 m)   Wt 90.7 kg   LMP 07/05/2018   SpO2 97%   BMI 33.28 kg/m   4:03 PM after breathing treatment, patient is moving air better.  Scant wheezing heard.  Subjectively she is feeling better.  We will discharged home at this time with prednisone, azithromycin, Tessalon.  She is to use her albuterol inhaler as directed by her doctor.  Encouraged return with worsening, PCP follow-up as needed.  Final Clinical Impressions(s) / ED Diagnoses   Final diagnoses:  Bronchitis  Exacerbation of asthma, unspecified asthma severity, unspecified whether persistent   Patient with likely URI/bronchitis in setting of asthma.  She has had symptoms for greater than 1 week.  Treatment as  above.  Clinical improvement in the ED after breathing treatment.    ED Discharge Orders         Ordered    predniSONE (DELTASONE) 20 MG tablet  Daily     07/09/18 1602    azithromycin (ZITHROMAX) 250 MG tablet  Daily     07/09/18 1602    benzonatate (TESSALON) 100 MG capsule  Every 8 hours     07/09/18 1602           Renne Crigler, PA-C 07/09/18 1605    Alvira Monday, MD 07/10/18 1130

## 2018-07-09 NOTE — ED Triage Notes (Signed)
Pt c/o cough and congestion for a week that is unrelieved by OTC cold medication

## 2018-07-09 NOTE — ED Notes (Signed)
Pt verbalizes understanding of d/c instructions and denies any further needs at this time. 

## 2018-07-09 NOTE — Discharge Instructions (Signed)
Please read and follow all provided instructions.  Your diagnoses today include:  1. Bronchitis   2. Exacerbation of asthma, unspecified asthma severity, unspecified whether persistent     Tests performed today include:  Vital signs. See below for your results today.   Medications prescribed:   Prednisone - steroid medicine   It is best to take this medication in the morning to prevent sleeping problems. If you are diabetic, monitor your blood sugar closely and stop taking Prednisone if blood sugar is over 300. Take with food to prevent stomach upset.    Azithromycin - antibiotic for respiratory infection  You have been prescribed an antibiotic medicine: take the entire course of medicine even if you are feeling better. Stopping early can cause the antibiotic not to work.   Tessalon Perles - cough suppressant medication  Take any prescribed medications only as directed.  Home care instructions:  Follow any educational materials contained in this packet.  Follow-up instructions: Please follow-up with your primary care provider in the next 3 days for further evaluation of your symptoms and a recheck if you are not feeling better.   Return instructions:   Please return to the Emergency Department if you experience worsening symptoms.  Please return with worsening wheezing, shortness of breath, or difficulty breathing.  Return with persistent fever above 101F.   Please return if you have any other emergent concerns.  Additional Information:  Your vital signs today were: BP (!) 137/96 (BP Location: Left Arm)    Pulse 96    Temp 98.6 F (37 C) (Oral)    Resp 20    Ht 5\' 5"  (1.651 m)    Wt 90.7 kg    LMP 07/05/2018    SpO2 97%    BMI 33.28 kg/m  If your blood pressure (BP) was elevated above 135/85 this visit, please have this repeated by your doctor within one month. --------------

## 2019-10-04 ENCOUNTER — Emergency Department (HOSPITAL_BASED_OUTPATIENT_CLINIC_OR_DEPARTMENT_OTHER)
Admission: EM | Admit: 2019-10-04 | Discharge: 2019-10-04 | Disposition: A | Discharge status: Home / Self Care | Payer: 59 | Attending: Emergency Medicine | Admitting: Emergency Medicine

## 2019-10-04 ENCOUNTER — Other Ambulatory Visit: Payer: Self-pay

## 2019-10-04 ENCOUNTER — Encounter (HOSPITAL_BASED_OUTPATIENT_CLINIC_OR_DEPARTMENT_OTHER): Payer: Self-pay | Admitting: Emergency Medicine

## 2019-10-04 DIAGNOSIS — Z20822 Contact with and (suspected) exposure to covid-19: Secondary | ICD-10-CM

## 2019-10-04 DIAGNOSIS — I10 Essential (primary) hypertension: Secondary | ICD-10-CM | POA: Diagnosis not present

## 2019-10-04 DIAGNOSIS — R05 Cough: Secondary | ICD-10-CM | POA: Diagnosis present

## 2019-10-04 DIAGNOSIS — Z79899 Other long term (current) drug therapy: Secondary | ICD-10-CM | POA: Diagnosis not present

## 2019-10-04 DIAGNOSIS — U071 COVID-19: Secondary | ICD-10-CM | POA: Diagnosis not present

## 2019-10-04 DIAGNOSIS — J45909 Unspecified asthma, uncomplicated: Secondary | ICD-10-CM | POA: Insufficient documentation

## 2019-10-04 MED ORDER — BENZONATATE 100 MG PO CAPS: 100.0000 mg | ORAL_CAPSULE | Freq: Three times a day (TID) | ORAL | 0 refills | Status: DC

## 2019-10-04 MED FILL — BENZONATATE 100 MG CAPS: 100 | 7 days supply | Qty: 21 | Fill #0

## 2019-10-04 NOTE — ED Notes (Signed)
C/o body aches, chills, runny nose  Denies fever  Onset 0200 this am

## 2019-10-04 NOTE — ED Provider Notes (Signed)
Veronica Odom Note   CSN: [id number] Arrival date & time: 10/04/19  7035     History Chief Complaint  Patient presents with  . URI    Veronica Odom is a 44 y.o. female.  HPI   Patient presents to the emergency room for evaluation of cough, myalgias and Covid-like symptoms.  Patient states her symptoms started this morning.  She feels like her sense of  smell is affected but her taste is still normal.  Started having a cough this morning.  She has some generalized body aches.  She feels a little short of breath.  She denies any fevers or chills.  No leg swelling.  No chest pain.  Patient does not have any known Covid exposures.  Past Medical History:  Diagnosis Date  . Asthma   . Hypertension     Patient Active Problem List   Diagnosis Date Noted  . Acute vestibular syndrome 12/29/2017  . Acute peripheral vestibulopathy, left 12/28/2017    Past Surgical History:  Procedure Laterality Date  . ABDOMINAL HYSTERECTOMY    . BREAST SURGERY    . CHOLECYSTECTOMY    . KNEE SURGERY    . TUBAL LIGATION       OB History   No obstetric history on file.     History reviewed. No pertinent family history.  Social History   Tobacco Use  . Smoking status: Never Smoker  . Smokeless tobacco: Never Used  Substance Use Topics  . Alcohol use: Yes    Comment: occ  . Drug use: No    Home Medications Prior to Admission medications   Medication Sig Start Date End Date Taking? Authorizing Odom  albuterol (PROVENTIL HFA) 108 (90 Base) MCG/ACT inhaler Inhale 1 puff into the lungs daily as needed for shortness of breath. 12/19/16   Odom, Historical, MD  amLODipine (NORVASC) 10 MG tablet Take 10 mg by mouth at bedtime.     Odom, Historical, MD  azithromycin (ZITHROMAX) 250 MG tablet Take 1 tablet (250 mg total) by mouth daily. Take first 2 tablets together, then 1 every day until finished. 07/09/18   Carlisle Cater, PA-C  beclomethasone  (QVAR) 40 MCG/ACT inhaler Inhale 2 puffs into the lungs 2 (two) times daily.    Odom, Historical, MD  benzonatate (TESSALON) 100 MG capsule Take 1 capsule (100 mg total) by mouth every 8 (eight) hours. 10/04/19   Veronica Rank, MD  etodolac (LODINE) 200 MG capsule Take 200 mg by mouth every 8 (eight) hours.    Odom, Historical, MD  fluticasone (FLONASE) 50 MCG/ACT nasal spray Place 1 spray into both nostrils daily. 12/13/16   Odom, Historical, MD  magic mouthwash SOLN Take 5 mLs by mouth 3 (three) times daily as needed for mouth pain. 04/10/18   Veronica Ade, PA-C  meclizine (ANTIVERT) 25 MG tablet Take 1 tablet (25 mg total) by mouth 2 (two) times daily as needed for dizziness (Take two times daily as needed for dizziness). 12/30/17   Veronica Ludwig, MD  meloxicam (MOBIC) 15 MG tablet Take 15 mg by mouth daily.    Odom, Historical, MD  methocarbamol (ROBAXIN) 500 MG tablet Take 1 tablet (500 mg total) by mouth 2 (two) times daily. 01/08/17   Mabe, Forbes Cellar, MD  naproxen (NAPROSYN) 500 MG tablet Take 1 tablet (500 mg total) by mouth 2 (two) times daily. 01/08/17   Mabe, Forbes Cellar, MD  predniSONE (DELTASONE) 20 MG tablet Take 2 tablets (40 mg total)  by mouth daily. 07/09/18   Veronica Crigler, PA-C    Allergies    Shellfish-derived products, Ivp dye [iodinated diagnostic agents], Latex, and Penicillins  Review of Systems   Review of Systems  All other systems reviewed and are negative.   Physical Exam Updated Vital Signs BP 125/90   Pulse 95   Temp 98.5 F (36.9 C) (Oral)   Resp 20   Ht 1.651 m (5\' 5" )   Wt 93.4 kg   LMP 07/05/2018   SpO2 99%   BMI 34.28 kg/m   Physical Exam Vitals and nursing note reviewed.  Constitutional:      General: She is not in acute distress.    Appearance: She is well-developed.  HENT:     Head: Normocephalic and atraumatic.     Right Ear: External ear normal.     Left Ear: External ear normal.  Eyes:     General: No scleral icterus.        Right eye: No discharge.        Left eye: No discharge.     Conjunctiva/sclera: Conjunctivae normal.  Neck:     Trachea: No tracheal deviation.  Cardiovascular:     Rate and Rhythm: Normal rate and regular rhythm.  Pulmonary:     Effort: Pulmonary effort is normal. No respiratory distress.     Breath sounds: Normal breath sounds. No stridor. No wheezing or rales.  Abdominal:     General: Bowel sounds are normal. There is no distension.     Palpations: Abdomen is soft.     Tenderness: There is no abdominal tenderness. There is no guarding or rebound.  Musculoskeletal:        General: No tenderness.     Cervical back: Neck supple.  Skin:    General: Skin is warm and dry.     Findings: No rash.  Neurological:     Mental Status: She is alert.     Cranial Nerves: No cranial nerve deficit (no facial droop, extraocular movements intact, no slurred speech).     Sensory: No sensory deficit.     Motor: No abnormal muscle tone or seizure activity.     Coordination: Coordination normal.     ED Results / Procedures / Treatments   Labs (all labs ordered are listed, but only abnormal results are displayed) Labs Reviewed  SARS CORONAVIRUS 2 (TAT 6-24 HRS)  NOVEL CORONAVIRUS, NAA (HOSP ORDER, SEND-OUT TO REF LAB; TAT 18-24 HRS)    EKG None  Radiology No results found.  Procedures Procedures (including critical care time)  Medications Ordered in ED Medications - No data to display  ED Course  I have reviewed the triage vital signs and the nursing notes.  Pertinent labs & imaging results that were available during my care of the patient were reviewed by me and considered in my medical decision making (see chart for details).    MDM Rules/Calculators/A&P                      [name] was evaluated in Emergency Department on 10/04/2019 for the symptoms described in the history of present illness. She was evaluated in the context of the global COVID-19 pandemic, which  necessitated consideration that the patient might be at risk for infection with the SARS-CoV-2 virus that causes COVID-19. Institutional protocols and algorithms that pertain to the evaluation of patients at risk for COVID-19 are in a state of rapid change based on information released by regulatory  bodies including the CDC and federal and state organizations. These policies and algorithms were followed during the patient's care in the ED.  Patient symptoms are concerning for Covid.  Her exam is reassuring.  Her lungs are clear.  She is not tachypneic and does not require any oxygen.  She appears stable for outpatient management.  Warning signs and precautions status.  Covid test ordered and patient instructed how to follow-up on those results.  Final Clinical Impression(s) / ED Diagnoses Final diagnoses:  Person under investigation for COVID-19    Rx / DC Orders ED Discharge Orders         Ordered    benzonatate (TESSALON) 100 MG capsule  Every 8 hours     10/04/19 0918           Linwood Dibbles, MD 10/04/19 [phone]

## 2019-10-04 NOTE — ED Triage Notes (Signed)
Pt having symptoms of flu like illness.  Chills, runny nose, achy all over, tired, not feeling well.  No known exposure.

## 2019-10-04 NOTE — Discharge Instructions (Signed)
Check your MyChart for your Covid test results.  They should be available typically within 24 hours.  Return to the ED for worsening symptoms, especially shortness of breath.  You can take over-the-counter medications as needed for aches and pains .  Tessalon is a medication that you can take for coughing

## 2019-10-05 ENCOUNTER — Telehealth (HOSPITAL_COMMUNITY): Payer: Self-pay

## 2019-10-05 LAB — NOVEL CORONAVIRUS, NAA (HOSP ORDER, SEND-OUT TO REF LAB; TAT 18-24 HRS): SARS-CoV-2, NAA: DETECTED — AB

## 2019-10-12 ENCOUNTER — Observation Stay (HOSPITAL_BASED_OUTPATIENT_CLINIC_OR_DEPARTMENT_OTHER): Payer: 59

## 2019-10-12 ENCOUNTER — Encounter (HOSPITAL_BASED_OUTPATIENT_CLINIC_OR_DEPARTMENT_OTHER): Payer: Self-pay | Admitting: *Deleted

## 2019-10-12 ENCOUNTER — Other Ambulatory Visit: Payer: Self-pay

## 2019-10-12 ENCOUNTER — Emergency Department (HOSPITAL_BASED_OUTPATIENT_CLINIC_OR_DEPARTMENT_OTHER): Payer: 59

## 2019-10-12 ENCOUNTER — Inpatient Hospital Stay (HOSPITAL_BASED_OUTPATIENT_CLINIC_OR_DEPARTMENT_OTHER)
Admission: EM | Admit: 2019-10-12 | Discharge: 2019-10-16 | DRG: 177 | Disposition: A | Discharge status: Home / Self Care | Payer: 59 | Attending: Internal Medicine | Admitting: Internal Medicine

## 2019-10-12 DIAGNOSIS — Z6832 Body mass index (BMI) 32.0-32.9, adult: Secondary | ICD-10-CM

## 2019-10-12 DIAGNOSIS — Z79899 Other long term (current) drug therapy: Secondary | ICD-10-CM

## 2019-10-12 DIAGNOSIS — I2694 Multiple subsegmental pulmonary emboli without acute cor pulmonale: Secondary | ICD-10-CM | POA: Diagnosis present

## 2019-10-12 DIAGNOSIS — Z880 Allergy status to penicillin: Secondary | ICD-10-CM | POA: Diagnosis not present

## 2019-10-12 DIAGNOSIS — K219 Gastro-esophageal reflux disease without esophagitis: Secondary | ICD-10-CM | POA: Diagnosis not present

## 2019-10-12 DIAGNOSIS — E669 Obesity, unspecified: Secondary | ICD-10-CM | POA: Diagnosis present

## 2019-10-12 DIAGNOSIS — R918 Other nonspecific abnormal finding of lung field: Secondary | ICD-10-CM | POA: Diagnosis not present

## 2019-10-12 DIAGNOSIS — U071 COVID-19: Principal | ICD-10-CM | POA: Diagnosis present

## 2019-10-12 DIAGNOSIS — Z7951 Long term (current) use of inhaled steroids: Secondary | ICD-10-CM

## 2019-10-12 DIAGNOSIS — E559 Vitamin D deficiency, unspecified: Secondary | ICD-10-CM | POA: Diagnosis not present

## 2019-10-12 DIAGNOSIS — Z809 Family history of malignant neoplasm, unspecified: Secondary | ICD-10-CM

## 2019-10-12 DIAGNOSIS — G4733 Obstructive sleep apnea (adult) (pediatric): Secondary | ICD-10-CM | POA: Diagnosis not present

## 2019-10-12 DIAGNOSIS — I2699 Other pulmonary embolism without acute cor pulmonale: Secondary | ICD-10-CM | POA: Diagnosis present

## 2019-10-12 DIAGNOSIS — J45909 Unspecified asthma, uncomplicated: Secondary | ICD-10-CM | POA: Diagnosis not present

## 2019-10-12 DIAGNOSIS — I10 Essential (primary) hypertension: Secondary | ICD-10-CM | POA: Diagnosis present

## 2019-10-12 DIAGNOSIS — Z8249 Family history of ischemic heart disease and other diseases of the circulatory system: Secondary | ICD-10-CM | POA: Diagnosis not present

## 2019-10-12 DIAGNOSIS — Z91040 Latex allergy status: Secondary | ICD-10-CM | POA: Diagnosis not present

## 2019-10-12 DIAGNOSIS — E785 Hyperlipidemia, unspecified: Secondary | ICD-10-CM | POA: Diagnosis not present

## 2019-10-12 DIAGNOSIS — Z91013 Allergy to seafood: Secondary | ICD-10-CM

## 2019-10-12 DIAGNOSIS — J1282 Pneumonia due to coronavirus disease 2019: Secondary | ICD-10-CM | POA: Diagnosis not present

## 2019-10-12 LAB — CBC WITH DIFFERENTIAL/PLATELET
Abs Immature Granulocytes: 0.03 10*3/uL (ref 0.00–0.07)
Basophils Absolute: 0 10*3/uL (ref 0.0–0.1)
Basophils Relative: 0 %
Eosinophils Absolute: 0.1 10*3/uL (ref 0.0–0.5)
Eosinophils Relative: 1 %
HCT: 39 % (ref 36.0–46.0)
Hemoglobin: 12.6 g/dL (ref 12.0–15.0)
Immature Granulocytes: 0 %
Lymphocytes Relative: 28 %
Lymphs Abs: 2.1 10*3/uL (ref 0.7–4.0)
MCH: 30.7 pg (ref 26.0–34.0)
MCHC: 32.3 g/dL (ref 30.0–36.0)
MCV: 94.9 fL (ref 80.0–100.0)
Monocytes Absolute: 0.6 10*3/uL (ref 0.1–1.0)
Monocytes Relative: 9 %
Neutro Abs: 4.6 10*3/uL (ref 1.7–7.7)
Neutrophils Relative %: 62 %
Platelets: 223 10*3/uL (ref 150–400)
RBC: 4.11 MIL/uL (ref 3.87–5.11)
RDW: 11.2 % — ABNORMAL LOW (ref 11.5–15.5)
WBC: 7.5 10*3/uL (ref 4.0–10.5)
nRBC: 0 % (ref 0.0–0.2)

## 2019-10-12 LAB — LIPASE, BLOOD: Lipase: 45 U/L (ref 11–51)

## 2019-10-12 LAB — TROPONIN I (HIGH SENSITIVITY)
Troponin I (High Sensitivity): <2 ng/L (ref <18)
Troponin I (High Sensitivity): <2 ng/L (ref <18)

## 2019-10-12 LAB — COMPREHENSIVE METABOLIC PANEL
ALT: 27 U/L (ref 0–44)
AST: 25 U/L (ref 15–41)
Albumin: 4.2 g/dL (ref 3.5–5.0)
Alkaline Phosphatase: 66 U/L (ref 38–126)
Anion gap: 14 (ref 5–15)
BUN: 10 mg/dL (ref 6–20)
CO2: 23 mmol/L (ref 22–32)
Calcium: 9.5 mg/dL (ref 8.9–10.3)
Chloride: 101 mmol/L (ref 98–111)
Creatinine, Ser: 1.03 mg/dL — ABNORMAL HIGH (ref 0.44–1.00)
GFR calc Af Amer: >60 mL/min (ref >60)
GFR calc non Af Amer: >60 mL/min (ref >60)
Glucose, Bld: 159 mg/dL — ABNORMAL HIGH (ref 70–99)
Potassium: 3.2 mmol/L — ABNORMAL LOW (ref 3.5–5.1)
Sodium: 138 mmol/L (ref 135–145)
Total Bilirubin: 0.6 mg/dL (ref 0.3–1.2)
Total Protein: 8.5 g/dL — ABNORMAL HIGH (ref 6.5–8.1)

## 2019-10-12 LAB — LACTIC ACID, PLASMA
Lactic Acid, Venous: 2.4 mmol/L (ref 0.5–1.9)
Lactic Acid, Venous: 1.5 mmol/L (ref 0.5–1.9)

## 2019-10-12 LAB — LACTATE DEHYDROGENASE: LDH: 180 U/L (ref 98–192)

## 2019-10-12 LAB — FIBRINOGEN: Fibrinogen: 530 mg/dL — ABNORMAL HIGH (ref 210–475)

## 2019-10-12 LAB — TRIGLYCERIDES: Triglycerides: 135 mg/dL (ref <150)

## 2019-10-12 LAB — FERRITIN: Ferritin: 305 ng/mL (ref 11–307)

## 2019-10-12 LAB — D-DIMER, QUANTITATIVE (NOT AT ARMC): D-Dimer, Quant: 19.85 ug/mL-FEU — ABNORMAL HIGH (ref 0.00–0.50)

## 2019-10-12 LAB — C-REACTIVE PROTEIN: CRP: 5.2 mg/dL — ABNORMAL HIGH (ref <1.0)

## 2019-10-12 LAB — PROCALCITONIN: Procalcitonin: <0.1 ng/mL

## 2019-10-12 MED ORDER — HYDROCORTISONE NA SUCCINATE PF 100 MG IJ SOLR
INTRAMUSCULAR | Status: AC
  Administered 2019-10-12: 200 mg
  Filled 2019-10-12: qty 4

## 2019-10-12 MED ORDER — IOHEXOL 350 MG/ML SOLN
100.0000 mL | Freq: Once | INTRAVENOUS | Status: AC | PRN
  Administered 2019-10-12: 15:00:00 100 mL via INTRAVENOUS

## 2019-10-12 MED ORDER — SODIUM CHLORIDE 0.9 % IV BOLUS
500.0000 mL | Freq: Once | INTRAVENOUS | Status: AC
  Administered 2019-10-12: 09:00:00 500 mL via INTRAVENOUS

## 2019-10-12 MED ORDER — ALBUTEROL SULFATE HFA 108 (90 BASE) MCG/ACT IN AERS
2.0000 | INHALATION_SPRAY | Freq: Once | RESPIRATORY_TRACT | Status: AC
  Administered 2019-10-12: 09:00:00 2 via RESPIRATORY_TRACT
  Filled 2019-10-12: qty 6.7

## 2019-10-12 MED ORDER — DIPHENHYDRAMINE HCL 25 MG PO CAPS: 50.0000 mg | ORAL_CAPSULE | Freq: Once | ORAL | Status: AC

## 2019-10-12 MED ORDER — FENTANYL CITRATE (PF) 100 MCG/2ML IJ SOLN
50.0000 ug | Freq: Once | INTRAMUSCULAR | Status: AC
  Administered 2019-10-12: 09:00:00 50 ug via INTRAVENOUS
  Filled 2019-10-12: qty 2

## 2019-10-12 MED ORDER — DIPHENHYDRAMINE HCL 50 MG/ML IJ SOLN
50.0000 mg | Freq: Once | INTRAMUSCULAR | Status: AC
  Administered 2019-10-12: 14:00:00 50 mg via INTRAVENOUS
  Filled 2019-10-12: qty 1

## 2019-10-12 MED ORDER — HEPARIN (PORCINE) 25000 UT/250ML-% IV SOLN
1000.0000 [IU]/h | INTRAVENOUS | Status: DC
  Administered 2019-10-12: 1400 [IU]/h via INTRAVENOUS
  Administered 2019-10-13: 1000 [IU]/h via INTRAVENOUS
  Filled 2019-10-12 (×2): qty 250

## 2019-10-12 MED ORDER — HYDROCORTISONE NA SUCCINATE PF 250 MG IJ SOLR
200.0000 mg | Freq: Once | INTRAMUSCULAR | Status: DC
  Filled 2019-10-12: qty 200

## 2019-10-12 MED ORDER — HEPARIN BOLUS VIA INFUSION
5000.0000 [IU] | Freq: Once | INTRAVENOUS | Status: AC
  Administered 2019-10-12: 5000 [IU] via INTRAVENOUS

## 2019-10-12 MED ORDER — SODIUM CHLORIDE 0.9 % IV BOLUS
1000.0000 mL | Freq: Once | INTRAVENOUS | Status: AC
  Administered 2019-10-12: 17:00:00 1000 mL via INTRAVENOUS

## 2019-10-12 NOTE — ED Notes (Signed)
Gave bedside phone to pt and explained how to use. Verbalized understanding.

## 2019-10-12 NOTE — H&P (Signed)
Veronica Odom BJY:[id number] DOB: April 12, 1976 DOA: 10/12/2019     PCP: Patria ManeGassemi, Mike, MD   Outpatient Specialists: Dr. Albertina SenegalFerraro neurology   Patient arrived to ER on 10/12/19 at 0741  Patient coming from: home Lives  With family    Chief Complaint:   Chief Complaint  Patient presents with  . Chest Pain  . Shortness of Breath  . Flank Pain    HPI: Veronica Odom is a 44 y.o. female with medical history significant of COVID Positive (10/05/2019), allergy, HTN, vitamin D deficiency, chronic headache, chronic pain, OSA, GERD, HLD   Presented with chest pain and worsening shortness of breath Since her diagnosis and if if she have had a loss of smell but otherwise have not had much of any symptoms up until now when she got suddenly more short of breath.  Cannot take a deep breath.  Had a chest pain worse yesterday.  Still no fever no cough no nausea no vomiting no diarrhea she has never had any prior symptoms. She has history of asthma but usually well controlled and this does not feel like a regular asthma She also reported some right calf tenderness  no TB exposure, no mold exposure Works at [employer]    Infectious risk factors:  Reports shortness of breath,  chest pain,     KNOWN COVID POSITIVE     Lab Results  Component Value Date   SARSCOV2NAA DETECTED (A) 10/04/2019     Regarding pertinent Chronic problems:    Hyperlipidemia -  Not on statins   HTN on HCTZ     obesity-   BMI Readings from Last 1 Encounters:  10/12/19 32.60 kg/m       Asthma -well  controlled on home inhalers/       While in ER: Initially D-dimer found to be positive CTA showed multiple left-sided pulmonary embolism Troponin negative no evidence of right heart strain Satting 96% on room air Evidence of multifocal pneumonia possible cavitation cannot rule out septic emboli more of a patchy infiltrates in the posterior lung bases Order heparin drip Right leg Dopplers negative for  DVT    The following Work up has been ordered so far:  Orders Placed This Encounter  Procedures  . Blood Culture (routine x 2)  . DG Chest Port 1 View  . CT Angio Chest PE W/Cm &/Or Wo Cm  . US Venous Img Lower Unilateral Right  . Comprehensive metabolic panel  . Lipase, blood  . CBC with Differential  . D-dimer, quantitative  . Lactic acid, plasma  . Procalcitonin  . Lactate dehydrogenase  . Ferritin  . Triglycerides  . C-reactive protein  . Heparin level (unfractionated)  . Heparin level (unfractionated)  . CBC  . Fibrinogen  . Cardiac monitoring  . Patient to wear surgical mask during transportation  . RN to draw the following extra tubes:  . Notify EDP if new oxygen requirements escalates > 4L per minute [place]  . RN/NT - Document specific oxygen requirements in CHL  . Cardiac monitoring  . heparin per pharmacy consult  . Consult to hospitalist  ALL PATIENTS BEING ADMITTED/HAVING PROCEDURES NEED COVID-19 SCREENING  . Airborne and Contact precautions  . ED EKG  . EKG 12-Lead  . Place in observation (patient's expected length of stay will be less than 2 midnights)     Following Medications were ordered in ER: Medications  hydrocortisone sodium succinate (SOLU-CORTEF) injection 200 mg (200 mg Intravenous Not Given 10/12/19 1055)  heparin ADULT infusion 100 units/mL (25000 units/265mL sodium chloride 0.45%) (1,400 Units/hr Intravenous New Bag/Given 10/12/19 1623)  sodium chloride 0.9 % bolus 500 mL (0 mLs Intravenous Stopped 10/12/19 1011)  fentaNYL (SUBLIMAZE) injection 50 mcg (50 mcg Intravenous Given 10/12/19 0852)  albuterol (VENTOLIN HFA) 108 (90 Base) MCG/ACT inhaler 2 puff (2 puffs Inhalation Given 10/12/19 0837)  diphenhydrAMINE (BENADRYL) capsule 50 mg ( Oral See Alternative 10/12/19 1424)    Or  diphenhydrAMINE (BENADRYL) injection 50 mg (50 mg Intravenous Given 10/12/19 1424)  hydrocortisone sodium succinate (SOLU-CORTEF) 100 MG injection (200 mg  Given 10/12/19 1054)   iohexol (OMNIPAQUE) 350 MG/ML injection 100 mL (100 mLs Intravenous Contrast Given 10/12/19 1505)  heparin bolus via infusion 5,000 Units (5,000 Units Intravenous Bolus from Bag 10/12/19 1624)  sodium chloride 0.9 % bolus 1,000 mL (0 mLs Intravenous Stopped 10/12/19 1840)        Consult Orders  (From admission, onward)         Start     Ordered   10/12/19 1534  Consult to hospitalist  ALL PATIENTS BEING ADMITTED/HAVING PROCEDURES NEED COVID-19 SCREENING Called [facility] spoke with Gala Romney  Once    Comments: ALL PATIENTS BEING ADMITTED/HAVING PROCEDURES NEED COVID-19 SCREENING  Provider:  (Not yet assigned)  Question Answer Comment  Place call to: [facility]   Reason for Consult Admit      10/12/19 1533            Significant initial  Findings: Abnormal Labs Reviewed  COMPREHENSIVE METABOLIC PANEL - Abnormal; Notable for the following components:      Result Value   Potassium 3.2 (*)    Glucose, Bld 159 (*)    Creatinine, Ser 1.03 (*)    Total Protein 8.5 (*)    All other components within normal limits  CBC WITH DIFFERENTIAL/PLATELET - Abnormal; Notable for the following components:   RDW 11.2 (*)    All other components within normal limits  D-DIMER, QUANTITATIVE (NOT AT [facility]) - Abnormal; Notable for the following components:   D-Dimer, Quant 19.85 (*)    All other components within normal limits  LACTIC ACID, PLASMA - Abnormal; Notable for the following components:   Lactic Acid, Venous 2.4 (*)    All other components within normal limits  C-REACTIVE PROTEIN - Abnormal; Notable for the following components:   CRP 5.2 (*)    All other components within normal limits  FIBRINOGEN - Abnormal; Notable for the following components:   Fibrinogen 530 (*)    All other components within normal limits     Otherwise labs showing:    Recent Labs  Lab 10/12/19 0855  NA 138  K 3.2*  CO2 23  GLUCOSE 159*  BUN 10  CREATININE 1.03*  CALCIUM 9.5    Cr         Up  from baseline see below Lab Results  Component Value Date   CREATININE 1.03 (H) 10/12/2019   CREATININE 0.77 12/29/2017   CREATININE 0.83 12/28/2017    Recent Labs  Lab 10/12/19 0855  AST 25  ALT 27  ALKPHOS 66  BILITOT 0.6  PROT 8.5*  ALBUMIN 4.2   Lab Results  Component Value Date   CALCIUM 9.5 10/12/2019      WBC      Component Value Date/Time   WBC 7.5 10/12/2019 0855   ANC    Component Value Date/Time   NEUTROABS 4.6 10/12/2019 0855   ALC No components found for: [facility]  Plt: Lab Results  Component Value Date   PLT 223 10/12/2019     Lactic Acid, Venous    Component Value Date/Time   LATICACIDVEN 1.5 10/12/2019 1815     Procalcitonin<0.1   COVID-19 Labs  Recent Labs    10/12/19 0855 10/12/19 1606  DDIMER 19.85*  --   FERRITIN  --  305  LDH  --  180  CRP  --  5.2*    Lab Results  Component Value Date   SARSCOV2NAA DETECTED (A) 10/04/2019     HG/HCT stable     Component Value Date/Time   HGB 12.6 10/12/2019 0855   HCT 39.0 10/12/2019 0855    Recent Labs  Lab 10/12/19 0855  LIPASE 45   No results for input(s): AMMONIA in the last 168 hours.  No components found for: LABALBU   Troponin <2 Cardiac Panel (last 3 results) No results for input(s): CKTOTAL, CKMB, TROPONINI, RELINDX in the last 72 hours.     ECG: Ordered Personally reviewed by me showing: HR : 106 Rhythm:  Sinus tachycardia    nonspecific changes,  QTC 423    UA  not ordered   Urine analysis:    Component Value Date/Time   COLORURINE YELLOW 04/10/2018 1805   APPEARANCEUR CLEAR 04/10/2018 1805   LABSPEC 1.025 04/10/2018 1805   PHURINE 6.0 04/10/2018 1805   GLUCOSEU NEGATIVE 04/10/2018 1805   HGBUR NEGATIVE 04/10/2018 1805   BILIRUBINUR SMALL (A) 04/10/2018 1805   KETONESUR NEGATIVE 04/10/2018 1805   PROTEINUR 30 (A) 04/10/2018 1805   NITRITE NEGATIVE 04/10/2018 1805   LEUKOCYTESUR NEGATIVE 04/10/2018 1805      Ordered    CXR - NON  acute     CTA chest - l sided  PE,  evidence of infiltrate also possible nodularity and cavitary lesion x1      ED Triage Vitals  Enc Vitals Group     BP 10/12/19 0930 122/83     Pulse Rate 10/12/19 0759 (!) 104     Resp 10/12/19 0930 20     Temp 10/12/19 0759 98.7 F (37.1 C)     Temp Source 10/12/19 0759 Oral     SpO2 10/12/19 0758 98 %     Weight 10/12/19 0801 203 lb 14.8 oz (92.5 kg)     Height 10/12/19 0801 5\' 6"  (1.676 m)     Head Circumference --      Peak Flow --      Pain Score 10/12/19 0800 10     Pain Loc --      Pain Edu? --      Excl. in GC? --   TMAX(24)@       Latest  Blood pressure 121/84, pulse 91, temperature 98.8 F (37.1 C), resp. rate 20, height 5\' 6"  (1.676 m), weight 91.6 kg, last menstrual period 07/05/2018, SpO2 98 %.     Hospitalist was called for admission for Covid infection and complication with pulmonary embolism   Review of Systems:    Pertinent positives include:  chest pain, shortness of breath at rest.   Constitutional:  No weight loss, night sweats, Fevers, chills, fatigue, weight loss  HEENT:  No headaches, Difficulty swallowing,Tooth/dental problems,Sore throat,  No sneezing, itching, ear ache, nasal congestion, post nasal drip,  Cardio-vascular:  NoOrthopnea, PND, anasarca, dizziness, palpitations.no Bilateral lower extremity swelling  GI:  No heartburn, indigestion, abdominal pain, nausea, vomiting, diarrhea, change in bowel habits, loss of appetite, melena, blood in stool, hematemesis Resp:  no  No dyspnea on exertion, No excess mucus, no productive cough, No non-productive cough, No coughing up of blood.No change in color of mucus.No wheezing. Skin:  no rash or lesions. No jaundice GU:  no dysuria, change in color of urine, no urgency or frequency. No straining to urinate.  No flank pain.  Musculoskeletal:  No joint pain or no joint swelling. No decreased range of motion. No back pain.  Psych:  No change in mood or  affect. No depression or anxiety. No memory loss.  Neuro: no localizing neurological complaints, no tingling, no weakness, no double vision, no gait abnormality, no slurred speech, no confusion  All systems reviewed and apart from HOPI all are negative  Past Medical History:   Past Medical History:  Diagnosis Date  . Asthma   . Hypertension       Past Surgical History:  Procedure Laterality Date  . ABDOMINAL HYSTERECTOMY    . BREAST SURGERY    . CHOLECYSTECTOMY    . KNEE SURGERY    . TUBAL LIGATION      Social History:  Ambulatory   Independently      reports that she has never smoked. She has never used smokeless tobacco. She reports current alcohol use. She reports that she does not use drugs.     Family History:   Family History  Problem Relation Age of Onset  . Heart failure Mother   no hx of blood clots in family Father has cancer  Allergies: Allergies  Allergen Reactions  . Shellfish-Derived Products Anaphylaxis  . Ivp Dye [Iodinated Diagnostic Agents] Hives and Itching  . Latex Hives and Itching  . Penicillins Hives and Swelling    Has patient had a PCN reaction causing immediate rash, facial/tongue/throat swelling, SOB or lightheadedness with hypotension: Yes  Has patient had a PCN reaction causing severe rash involving mucus membranes or skin necrosis: Yes Has patient had a PCN reaction that required hospitalization: Yes Has patient had a PCN reaction occurring within the last 10 years: no If all of the above answers are "NO", then may proceed with Cephalosporin use.      Prior to Admission medications   Medication Sig Start Date End Date Taking? Authorizing Provider  ADVAIR DISKUS 250-50 MCG/DOSE AEPB Inhale 1 puff into the lungs 2 (two) times daily. 05/17/19  Yes [provider]  albuterol (PROVENTIL HFA) 108 (90 Base) MCG/ACT inhaler Inhale 1 puff into the lungs daily as needed for shortness of breath. 12/19/16  Yes [provider]  beclomethasone (QVAR) 40 MCG/ACT inhaler Inhale 2 puffs into the lungs 2 (two) times daily.   Yes [provider]  benzonatate (TESSALON) 100 MG capsule Take 1 capsule (100 mg total) by mouth every 8 (eight) hours. 10/04/19  Yes Linwood Dibbles, MD  etodolac (LODINE) 200 MG capsule Take 200 mg by mouth every 8 (eight) hours as needed for mild pain or moderate pain.    Yes [provider]  fluticasone (FLONASE) 50 MCG/ACT nasal spray Place 1 spray into both nostrils daily as needed for allergies or rhinitis.  12/13/16  Yes [provider]  gabapentin (NEURONTIN) 300 MG capsule Take 300 mg by mouth 3 (three) times daily. 07/14/19  Yes [provider]  hydrochlorothiazide (HYDRODIURIL) 12.5 MG tablet Take 12.5 mg by mouth daily. 09/17/19  Yes [provider]  meclizine (ANTIVERT) 25 MG tablet Take 1 tablet (25 mg total) by mouth 2 (two) times daily as needed for dizziness (Take two times daily  as needed for dizziness). 12/30/17  Yes Kathi Ludwig, MD  tolterodine (DETROL LA) 2 MG 24 hr capsule Take 2 mg by mouth daily. 06/17/19  Yes [provider]  topiramate (TOPAMAX) 100 MG tablet Take 100 mg by mouth at bedtime. 09/17/19  Yes [provider]  Vitamin D, Ergocalciferol, (DRISDOL) 1.25 MG (50000 UNIT) CAPS capsule Take 50,000 Units by mouth once a week. 10/12/19  Yes [provider]  magic mouthwash SOLN Take 5 mLs by mouth 3 (three) times daily as needed for mouth pain. Patient not taking: Reported on 10/12/2019 04/10/18   Glyn Ade, PA-C   Physical Exam: Blood pressure 121/84, pulse 91, temperature 98.8 F (37.1 C), resp. rate 20, height 5\' 6"  (1.676 m), weight 91.6 kg, last menstrual period 07/05/2018, SpO2 98 %. 1. General:  in No Acute distress   well  -appearing 2. Psychological: Alert and   Oriented 3. Head/ENT:    Dry Mucous Membranes                          Head Non traumatic, neck supple                            Healthy Dentition 4. SKIN:   decreased Skin turgor,  Skin clean Dry and intact no rash 5. Heart: Regular rate and rhythm no Murmur, no Rub or gallop 6. Lungs:   no wheezes or crackles   7. Abdomen: Soft,  non-tender, Non distended  Obese bowel sounds present 8. Lower extremities: no clubbing, cyanosis, no  edema 9. Neurologically Grossly intact, moving all 4 extremities equally   10. MSK: Normal range of motion   All other LABS:     Recent Labs  Lab 10/12/19 0855  WBC 7.5  NEUTROABS 4.6  HGB 12.6  HCT 39.0  MCV 94.9  PLT 223     Recent Labs  Lab 10/12/19 0855  NA 138  K 3.2*  CL 101  CO2 23  GLUCOSE 159*  BUN 10  CREATININE 1.03*  CALCIUM 9.5     Recent Labs  Lab 10/12/19 0855  AST 25  ALT 27  ALKPHOS 66  BILITOT 0.6  PROT 8.5*  ALBUMIN 4.2       Cultures:    Component Value Date/Time   SDES THROAT 03/09/2016 2225   SPECREQUEST NONE Reflexed from [id number] 03/09/2016 2225   CULT ABUNDANT STREPTOCOCCUS,BETA HEMOLYTIC NOT GROUP A 03/09/2016 2225   REPTSTATUS 03/12/2016 FINAL 03/09/2016 2225     Radiological Exams on Admission: CT Angio Chest PE W/Cm &/Or Wo Cm  Result Date: 10/12/2019 CLINICAL DATA:  Shortness of breath.  Chest pain.  COVID-19 positive EXAM: CT ANGIOGRAPHY CHEST WITH CONTRAST TECHNIQUE: Multidetector CT imaging of the chest was performed using the standard protocol during bolus administration of intravenous contrast. Multiplanar CT image reconstructions and MIPs were obtained to evaluate the vascular anatomy. CONTRAST:  173mL OMNIPAQUE IOHEXOL 350 MG/ML SOLN COMPARISON:  Chest radiograph October 12, 2019. FINDINGS: Cardiovascular: There are pulmonary emboli arising from the distal left main pulmonary artery with extension into multiple lower lobe pulmonary arterial vascular structures on the left. There is a small pulmonary embolus in the posterior segment left upper lobe pulmonary artery as well. No pulmonary emboli to the right of midline  or appreciable. The right ventricle to left ventricle diameter ratio is less than 0.9, not indicative of right heart strain. There is  no thoracic aortic aneurysm or dissection. Visualized great vessels appear normal. There is no pericardial effusion or pericardial thickening. Mediastinum/Nodes: Visualized thyroid appears unremarkable. There is no appreciable thoracic adenopathy by size criteria. There are occasional subcentimeter lymph nodes evident. No esophageal lesions are appreciable. Lungs/Pleura: There are multiple somewhat nodular appearing areas of opacity throughout the lungs consistent with multifocal pneumonia. There is suggestion of early cavitation in a somewhat nodular lesion in the superior segment of the left lower lobe. More patchy airspace opacity is noted posteriorly in the lung bases. No pleural effusions are evident. No well-defined pulmonary infarct is evident. Upper Abdomen: There is a cyst arising from the posterior upper pole of the left kidney measuring 2.2 x 2.2 cm. Gallbladder is absent. Visualized upper abdominal structures otherwise appear unremarkable. Musculoskeletal: No blastic or lytic bone lesions are evident. No chest wall lesions appreciable. Review of the MIP images confirms the above findings. IMPRESSION: 1. Multiple left-sided pulmonary emboli with embolism arising from the distal most aspect of the left main pulmonary artery. No appreciable pulmonary embolus on the right evident. No right heart strain. No thoracic aortic aneurysm or dissection. 2. Multifocal pneumonia with multiple somewhat nodular opacities bilaterally. There is subtle cavitation in a superior segment left lower lobe area of infiltrate. There may be septic emboli associated with the pneumonia. More patchy infiltrate is noted in the posterior lung bases. 3.  No adenopathy by size criteria. Critical Value/emergent results were called by telephone at the time of interpretation on 10/12/2019 at 3:33 pm to  [facility] , who verbally acknowledged these results. Electronically Signed   By: Bretta Bang III M.D.   On: 10/12/2019 15:33   US Venous Img Lower Unilateral Right  Result Date: 10/12/2019 CLINICAL DATA:  Right calf pain. EXAM: Right LOWER EXTREMITY VENOUS DOPPLER ULTRASOUND TECHNIQUE: Gray-scale sonography with graded compression, as well as color Doppler and duplex ultrasound were performed to evaluate the lower extremity deep venous systems from the level of the common femoral vein and including the common femoral, femoral, profunda femoral, popliteal and calf veins including the posterior tibial, peroneal and gastrocnemius veins when visible. The superficial great saphenous vein was also interrogated. Spectral Doppler was utilized to evaluate flow at rest and with distal augmentation maneuvers in the common femoral, femoral and popliteal veins. COMPARISON:  None. FINDINGS: Contralateral Common Femoral Vein: Respiratory phasicity is normal and symmetric with the symptomatic side. No evidence of thrombus. Normal compressibility. Common Femoral Vein: No evidence of thrombus. Normal compressibility, respiratory phasicity and response to augmentation. Saphenofemoral Junction: No evidence of thrombus. Normal compressibility and flow on color Doppler imaging. Profunda Femoral Vein: No evidence of thrombus. Normal compressibility and flow on color Doppler imaging. Femoral Vein: No evidence of thrombus. Normal compressibility, respiratory phasicity and response to augmentation. Popliteal Vein: No evidence of thrombus. Normal compressibility, respiratory phasicity and response to augmentation. Calf Veins: No evidence of thrombus. Normal compressibility and flow on color Doppler imaging. Superficial Great Saphenous Vein: No evidence of thrombus. Normal compressibility. Venous Reflux:  None. Other Findings:  None. IMPRESSION: No evidence of deep venous thrombosis seen in right lower extremity.  Electronically Signed   By: Lupita Raider M.D.   On: 10/12/2019 18:58   DG Chest Port 1 View  Result Date: 10/12/2019 CLINICAL DATA:  Chest pain and shortness of breath EXAM: PORTABLE CHEST 1 VIEW COMPARISON:  November 24, 2016 FINDINGS: The lungs are clear. Heart size and pulmonary vascularity are normal. No adenopathy. No pneumothorax. No bone  lesions. IMPRESSION: Lungs clear. Cardiac silhouette within normal limits. No evident adenopathy. Electronically Signed   By: Bretta BangWilliam  Woodruff III M.D.   On: 10/12/2019 08:30    Chart has been reviewed    Assessment/Plan  Covid infection and complication with pulmonary embolism  Admitted for Covid infection/Covid pneumonia and PE  Present on Admission:  . COVID-19 virus infection/ . Pneumonia due to COVID-19 virus  FROM HOME  WITH KNOWN HX OF COVID19     Following concerning LAB/ imaging findings:     BMP: increased BUN/Cr   CRP, LDH: increased   IL-6 and Ferritin increased   Procalcitonin: low   CT chest: GGO,      -Following work-up initiated:        Blood cultures  Ordered 10/12/19,   CTA  Showed PE      Plan of treatment:   Admit on Airborn Precautions to Current facility      pharmacy consult for remdesivir      - Supportive management -Fluid sparing resuscitation  -Provide oxygen as needed currently on  RA SpO2: 98 %    Poor Prognostic factors  43 y.o.  Personal hx of   HTN, obesity  PE in the setting of Covid      Will order Airborne and Contact precautions     . Pulmonary nodules -unclear etiology patient denies any TB exposure There is evidence of superimposed viral appearing pneumonia Obtain echogram to evaluate for any evidence of embolic disease Blood cultures No fever or evidence of bacterial infection will likely need further evaluation and follow-up will discuss with pulmonology in the morning   . Acute pulmonary embolism (HCC) -  Admit to telemetry  On heparin drip  Would likely benefit  from case manager consult for long term anticoagulation Hold home blood pressure medications avoid hypotension Cycle cardiac enzymes Order echogram and lower extremity Dopplers on the right per patient was symptomatic were negative Most likely risk factors for hypercoagulable state being COvid obesity       Other plan as per orders.  DVT prophylaxis:  heparin  Code Status:  FULL CODE as per patient  I had personally discussed CODE STATUS with patient    Family Communication:   Family not at  Bedside   Disposition Plan:          To home once workup is complete and patient is stable     Consults called:none  Admission status:  ED Disposition    ED Disposition Condition Comment   Admit  [facility][id number]  Level of Care: Telemetry [5]  Admit to tele based on following criteria: Monitor for Ischemic changes  Covid Evaluation: Confirmed COVID Positive  Diagnosis: Acute pulmonary embolism [facility](HCC) [[id number]]  Admitting Physician: [place], ERIC J [[id number]]  Attending Physician: [place], ERIC J [[id number]]       Obs     Level of care   tele  For   24H     Precautions: admitted as   covid positive Airborne and Contact precautions     PPE: Used by the provider:   P100  eye Goggles,  Gloves  gown   Tarena Gockley 10/13/2019, 1:02 AM    [facility]     after 2 AM please page floor coverage PA If 7AM-7PM, please contact the day team taking care of the patient using [email or web address]

## 2019-10-12 NOTE — ED Triage Notes (Signed)
Chest pain, shortness of breath and bilateral flank pain started at 0400 today.  Positive for covid on the 5th.

## 2019-10-12 NOTE — ED Notes (Signed)
ED Provider at bedside. 

## 2019-10-12 NOTE — Progress Notes (Signed)
Pt received from [facility]. Pt in 1434 resting comfortably. Admitting paged. Awaiting orders.

## 2019-10-12 NOTE — ED Provider Notes (Addendum)
[place] EMERGENCY DEPARTMENT Provider Note   CSN: [id number] Arrival date & time: 10/12/19  0741     History Chief Complaint  Patient presents with  . Chest Pain  . Shortness of Breath  . Flank Pain    Veronica Odom is a 44 y.o. female.  The history is provided by the patient and medical records. No language interpreter was used.  Chest Pain Associated symptoms: shortness of breath   Shortness of Breath Associated symptoms: chest pain    Veronica Odom is a 44 y.o. female who presents to the Emergency Department complaining of chest pain and shortness of breath. She presents to the emergency department complaining of bilateral axillary chest pain and shortness of breath that will occur at 4 AM this morning. She was diagnosed with COVID-19 on January 5. She states that she had transient loss of smell but otherwise minimal symptoms and they quickly resolved. This morning when she awoke she had significant chest wall soreness bilaterally as well as epigastric pain. She has associated shortness of breath and feels like she cannot take a deep breath. She states that yesterday she had some pain in her right calf. No reports of fever. No cough. No nausea, vomiting, diarrhea. No prior similar symptoms. She does have a history of asthma but this feels different.    Past Medical History:  Diagnosis Date  . Asthma   . Hypertension     Patient Active Problem List   Diagnosis Date Noted  . Acute vestibular syndrome 12/29/2017  . Acute peripheral vestibulopathy, left 12/28/2017    Past Surgical History:  Procedure Laterality Date  . ABDOMINAL HYSTERECTOMY    . BREAST SURGERY    . CHOLECYSTECTOMY    . KNEE SURGERY    . TUBAL LIGATION       OB History    Gravida  4   Para  4   Term      Preterm      AB      Living        SAB      TAB      Ectopic      Multiple      Live Births              Family History  Problem Relation Age of Onset  . Heart  failure Mother     Social History   Tobacco Use  . Smoking status: Never Smoker  . Smokeless tobacco: Never Used  Substance Use Topics  . Alcohol use: Yes    Comment: occ  . Drug use: No    Home Medications Prior to Admission medications   Medication Sig Start Date End Date Taking? Authorizing Provider  albuterol (PROVENTIL HFA) 108 (90 Base) MCG/ACT inhaler Inhale 1 puff into the lungs daily as needed for shortness of breath. 12/19/16  Yes [provider]  amLODipine (NORVASC) 10 MG tablet Take 10 mg by mouth at bedtime.    Yes [provider]  beclomethasone (QVAR) 40 MCG/ACT inhaler Inhale 2 puffs into the lungs 2 (two) times daily.   Yes [provider]  etodolac (LODINE) 200 MG capsule Take 200 mg by mouth every 8 (eight) hours.   Yes [provider]  fluticasone (FLONASE) 50 MCG/ACT nasal spray Place 1 spray into both nostrils daily. 12/13/16  Yes [provider]  magic mouthwash SOLN Take 5 mLs by mouth 3 (three) times daily as needed for mouth pain. 04/10/18  Yes Shrosbree,  Danella Penton, PA-C  meclizine (ANTIVERT) 25 MG tablet Take 1 tablet (25 mg total) by mouth 2 (two) times daily as needed for dizziness (Take two times daily as needed for dizziness). 12/30/17  Yes Lanelle Bal, MD  benzonatate (TESSALON) 100 MG capsule Take 1 capsule (100 mg total) by mouth every 8 (eight) hours. 10/04/19   Linwood Dibbles, MD  naproxen (NAPROSYN) 500 MG tablet Take 1 tablet (500 mg total) by mouth 2 (two) times daily. 01/08/17   Mabe, Latanya Maudlin, MD    Allergies    Shellfish-derived products, Ivp dye [iodinated diagnostic agents], Latex, and Penicillins  Review of Systems   Review of Systems  Respiratory: Positive for shortness of breath.   Cardiovascular: Positive for chest pain.  All other systems reviewed and are negative.   Physical Exam Updated Vital Signs BP 111/83   Pulse 87   Temp 98.7 F (37.1 C) (Oral)   Resp 18   Ht 5\' 6"  (1.676 m)    Wt 92.5 kg   LMP 07/05/2018   SpO2 95%   BMI 32.91 kg/m   Physical Exam Vitals and nursing note reviewed.  Constitutional:      Appearance: She is well-developed.  HENT:     Head: Normocephalic and atraumatic.  Cardiovascular:     Rate and Rhythm: Regular rhythm. Tachycardia present.     Heart sounds: No murmur.  Pulmonary:     Effort: Pulmonary effort is normal. No respiratory distress.     Comments: Good air movement bilaterally. No wheezes. Abdominal:     Palpations: Abdomen is soft.     Tenderness: There is abdominal tenderness. There is no guarding or rebound.     Comments: Moderate epigastric tenderness  Musculoskeletal:        General: No swelling.     Comments: Mild right calf tenderness  Skin:    General: Skin is warm and dry.  Neurological:     Mental Status: She is alert and oriented to person, place, and time.  Psychiatric:        Behavior: Behavior normal.     ED Results / Procedures / Treatments   Labs (all labs ordered are listed, but only abnormal results are displayed) Labs Reviewed  COMPREHENSIVE METABOLIC PANEL - Abnormal; Notable for the following components:      Result Value   Potassium 3.2 (*)    Glucose, Bld 159 (*)    Creatinine, Ser 1.03 (*)    Total Protein 8.5 (*)    All other components within normal limits  CBC WITH DIFFERENTIAL/PLATELET - Abnormal; Notable for the following components:   RDW 11.2 (*)    All other components within normal limits  D-DIMER, QUANTITATIVE (NOT AT [facility]) - Abnormal; Notable for the following components:   D-Dimer, Quant 19.85 (*)    All other components within normal limits  LIPASE, BLOOD  TROPONIN I (HIGH SENSITIVITY)  TROPONIN I (HIGH SENSITIVITY)    EKG EKG Interpretation  Date/Time:  Tuesday October 12 2019 08:53:59 EST Ventricular Rate:  106 PR Interval:    QRS Duration: 94 QT Interval:  318 QTC Calculation: 423 R Axis:   11 Text Interpretation: Sinus tachycardia Abnormal R-wave  progression, early transition Nonspecific T abnormalities, diffuse leads Confirmed by [id number] [phone]) on 10/12/2019 9:02:17 AM   Radiology CT Angio Chest PE W/Cm &/Or Wo Cm  Result Date: 10/12/2019 CLINICAL DATA:  Shortness of breath.  Chest pain.  COVID-19 positive EXAM: CT ANGIOGRAPHY CHEST WITH CONTRAST TECHNIQUE: Multidetector  CT imaging of the chest was performed using the standard protocol during bolus administration of intravenous contrast. Multiplanar CT image reconstructions and MIPs were obtained to evaluate the vascular anatomy. CONTRAST:  OMNIPAQUE IOHEXOL 350 MG/ML SOLN COMPARISON:  Chest radiograph October 12, 2019. FINDINGS: Cardiovascular: There are pulmonary emboli arising from the distal left main pulmonary artery with extension into multiple lower lobe pulmonary arterial vascular structures on the left. There is a small pulmonary embolus in the posterior segment left upper lobe pulmonary artery as well. No pulmonary emboli to the right of midline or appreciable. The right ventricle to left ventricle diameter ratio is less than 0.9, not indicative of right heart strain. There is no thoracic aortic aneurysm or dissection. Visualized great vessels appear normal. There is no pericardial effusion or pericardial thickening. Mediastinum/Nodes: Visualized thyroid appears unremarkable. There is no appreciable thoracic adenopathy by size criteria. There are occasional subcentimeter lymph nodes evident. No esophageal lesions are appreciable. Lungs/Pleura: There are multiple somewhat nodular appearing areas of opacity throughout the lungs consistent with multifocal pneumonia. There is suggestion of early cavitation in a somewhat nodular lesion in the superior segment of the left lower lobe. More patchy airspace opacity is noted posteriorly in the lung bases. No pleural effusions are evident. No well-defined pulmonary infarct is evident. Upper Abdomen: There is a cyst arising from the  posterior upper pole of the left kidney measuring 2.2 x 2.2 cm. Gallbladder is absent. Visualized upper abdominal structures otherwise appear unremarkable. Musculoskeletal: No blastic or lytic bone lesions are evident. No chest wall lesions appreciable. Review of the MIP images confirms the above findings. IMPRESSION: 1. Multiple left-sided pulmonary emboli with embolism arising from the distal most aspect of the left main pulmonary artery. No appreciable pulmonary embolus on the right evident. No right heart strain. No thoracic aortic aneurysm or dissection. 2. Multifocal pneumonia with multiple somewhat nodular opacities bilaterally. There is subtle cavitation in a superior segment left lower lobe area of infiltrate. There may be septic emboli associated with the pneumonia. More patchy infiltrate is noted in the posterior lung bases. 3.  No adenopathy by size criteria. Critical Value/emergent results were called by telephone at the time of interpretation on 10/12/2019 at 3:33 pm to [facility] , who verbally acknowledged these results. Electronically Signed   By: Bretta Bang III M.D.   On: 10/12/2019 15:33   DG Chest Port 1 View  Result Date: 10/12/2019 CLINICAL DATA:  Chest pain and shortness of breath EXAM: PORTABLE CHEST 1 VIEW COMPARISON:  November 24, 2016 FINDINGS: The lungs are clear. Heart size and pulmonary vascularity are normal. No adenopathy. No pneumothorax. No bone lesions. IMPRESSION: Lungs clear. Cardiac silhouette within normal limits. No evident adenopathy. Electronically Signed   By: Bretta Bang III M.D.   On: 10/12/2019 08:30    Procedures Procedures (including critical care time) CRITICAL CARE Performed by: Tilden Fossa   Total critical care time: 45 minutes  Critical care time was exclusive of separately billable procedures and treating other patients.  Critical care was necessary to treat or prevent imminent or life-threatening  deterioration.  Critical care was time spent personally by me on the following activities: development of treatment plan with patient and/or surrogate as well as nursing, discussions with consultants, evaluation of patient's response to treatment, examination of patient, obtaining history from patient or surrogate, ordering and performing treatments and interventions, ordering and review of laboratory studies, ordering and review of radiographic studies, pulse oximetry and re-evaluation of patient's condition.  Medications Ordered in ED Medications  hydrocortisone sodium succinate (SOLU-CORTEF) injection 200 mg (200 mg Intravenous Not Given 10/12/19 1055)  sodium chloride 0.9 % bolus 500 mL (0 mLs Intravenous Stopped 10/12/19 1011)  fentaNYL (SUBLIMAZE) injection 50 mcg (50 mcg Intravenous Given 10/12/19 0852)  albuterol (VENTOLIN HFA) 108 (90 Base) MCG/ACT inhaler 2 puff (2 puffs Inhalation Given 10/12/19 0837)  diphenhydrAMINE (BENADRYL) capsule 50 mg ( Oral See Alternative 10/12/19 1424)    Or  diphenhydrAMINE (BENADRYL) injection 50 mg (50 mg Intravenous Given 10/12/19 1424)  hydrocortisone sodium succinate (SOLU-CORTEF) 100 MG injection (200 mg  Given 10/12/19 1054)  iohexol (OMNIPAQUE) 350 MG/ML injection 100 mL (100 mLs Intravenous Contrast Given 10/12/19 1505)    ED Course  I have reviewed the triage vital signs and the nursing notes.  Pertinent labs & imaging results that were available during my care of the patient were reviewed by me and considered in my medical decision making (see chart for details).    MDM Rules/Calculators/A&P                     Patient here for evaluation of chest pain and difficulty breathing that began today. She was recently diagnosed with COVID-19 infection. D dimer was obtained and was markedly elevated and CTA was obtained. There was a delay in obtaining CT due to patient's prior contrast allergy and requiring premedication. CT returned positive for pulmonary  embolism. Patient was started on heparin drip. EKG does have T-wave inversions that are new compared to prior, likely secondary to pulmonary embolism. Discussed with patient findings of studies recommendation for admission and she is in agreement with treatment plan. Hospitalist consulted for admission.  Veronica Odom was evaluated in Emergency Department on 10/12/2019 for the symptoms described in the history of present illness. She was evaluated in the context of the global COVID-19 pandemic, which necessitated consideration that the patient might be at risk for infection with the SARS-CoV-2 virus that causes COVID-19. Institutional protocols and algorithms that pertain to the evaluation of patients at risk for COVID-19 are in a state of rapid change based on information released by regulatory bodies including the CDC and federal and state organizations. These policies and algorithms were followed during the patient's care in the ED.  Final Clinical Impression(s) / ED Diagnoses Final diagnoses:  COVID-19 virus infection  Multiple subsegmental pulmonary emboli without acute cor pulmonale [facility])    Rx / DC Orders ED Discharge Orders    None       Tilden Fossa, MD 10/12/19 1541    Tilden Fossa, MD 10/12/19 1549

## 2019-10-12 NOTE — Progress Notes (Signed)
Received call from ED physician, Dr. Madilyn Hook for transfer of Veronica Odom.  1F with recent Covid-19+ diagnosis on January 5.  Presented to med [facility] complaining of chest pain and shortness of breath.  D-dimer.  Remains positive for Covid-19.  CT PA with multiple left-sided pulmonary emboli, no emboli on the left.  Also notes multifocal pneumonia.  No right heart strain appreciated.  Troponin negative.  EKG with T wave inversions 2, 3, aVF and V3 -V6.   Current vital signs temperature 98.7, HR 94, RR 18, BP 123/82, SPO2 96% on room air.  The WBC 7.5, hemoglobin 12.6, D-dimer 19.85.  Inflammatory markers just ordered.  Started on heparin drip.  ER physician not comfortable sending home given EKG changes.  Accepted to observation status, telemetry at [facility] long; for acute PE, Covid-19 viral infection, multifocal pneumonia and EKG changes.  Please call patient placement RN of [facility] at [phone] when pt arrives to floor

## 2019-10-12 NOTE — Progress Notes (Signed)
ANTICOAGULATION CONSULT NOTE - Follow Up Consult  Pharmacy Consult for Heparin Indication: pulmonary embolus  Allergies  Allergen Reactions  . Shellfish-Derived Products Anaphylaxis  . Ivp Dye [Iodinated Diagnostic Agents] Hives and Itching  . Latex Hives and Itching  . Penicillins Hives and Swelling    Has patient had a PCN reaction causing immediate rash, facial/tongue/throat swelling, SOB or lightheadedness with hypotension: Yes  Has patient had a PCN reaction causing severe rash involving mucus membranes or skin necrosis: Yes Has patient had a PCN reaction that required hospitalization: Yes Has patient had a PCN reaction occurring within the last 10 years: no If all of the above answers are "NO", then may proceed with Cephalosporin use.     Patient Measurements: Height: 5\' 6"  (167.6 cm) Weight: 203 lb 14.8 oz (92.5 kg) IBW/kg (Calculated) : 59.3 Heparin Dosing Weight: 82 kg  Vital Signs: Temp: 98.7 F (37.1 C) (01/12 0759) Temp Source: Oral (01/12 0759) BP: 123/82 (01/12 1540) Pulse Rate: 94 (01/12 1540)  Labs: Recent Labs    10/12/19 0817 10/12/19 0855 10/12/19 1039  HGB  --  12.6  --   HCT  --  39.0  --   PLT  --  223  --   CREATININE  --  1.03*  --   TROPONINIHS <2  --  <2    Estimated Creatinine Clearance: 79.1 mL/min (A) (by C-G formula based on SCr of 1.03 mg/dL (H)).  Assessment: 44 year old female Covid + found to have PE  Goal of Therapy:  Heparin level 0.3-0.7 units/ml Monitor platelets by anticoagulation protocol: Yes   Plan:  Heparin 5000 units iv bolus x 1 Heparin drip at 1400 units / hr Heparin level 6 hours after heparin starts Daily heparin level, CBC  Thank you 54, PharmD  10/12/2019,3:58 PM

## 2019-10-12 NOTE — ED Notes (Signed)
Ultrasound at bedside

## 2019-10-12 NOTE — ED Notes (Signed)
Date and time results received: 10/12/19 1642   Test:lactic acid Critical Value: 2.4  Name of Provider Notified: Boneta Lucks RN, who will notify hospitalist  Orders Received? Or Actions Taken?: no orders given

## 2019-10-13 ENCOUNTER — Observation Stay (HOSPITAL_COMMUNITY): Payer: 59

## 2019-10-13 DIAGNOSIS — Z8249 Family history of ischemic heart disease and other diseases of the circulatory system: Secondary | ICD-10-CM | POA: Diagnosis not present

## 2019-10-13 DIAGNOSIS — Z809 Family history of malignant neoplasm, unspecified: Secondary | ICD-10-CM | POA: Diagnosis not present

## 2019-10-13 DIAGNOSIS — J1282 Pneumonia due to coronavirus disease 2019: Secondary | ICD-10-CM | POA: Diagnosis present

## 2019-10-13 DIAGNOSIS — U071 COVID-19: Secondary | ICD-10-CM | POA: Diagnosis present

## 2019-10-13 DIAGNOSIS — J45909 Unspecified asthma, uncomplicated: Secondary | ICD-10-CM | POA: Diagnosis present

## 2019-10-13 DIAGNOSIS — K219 Gastro-esophageal reflux disease without esophagitis: Secondary | ICD-10-CM | POA: Diagnosis present

## 2019-10-13 DIAGNOSIS — E559 Vitamin D deficiency, unspecified: Secondary | ICD-10-CM | POA: Diagnosis present

## 2019-10-13 DIAGNOSIS — Z7951 Long term (current) use of inhaled steroids: Secondary | ICD-10-CM | POA: Diagnosis not present

## 2019-10-13 DIAGNOSIS — Z91040 Latex allergy status: Secondary | ICD-10-CM | POA: Diagnosis not present

## 2019-10-13 DIAGNOSIS — E669 Obesity, unspecified: Secondary | ICD-10-CM | POA: Diagnosis present

## 2019-10-13 DIAGNOSIS — Z6832 Body mass index (BMI) 32.0-32.9, adult: Secondary | ICD-10-CM | POA: Diagnosis not present

## 2019-10-13 DIAGNOSIS — Z880 Allergy status to penicillin: Secondary | ICD-10-CM | POA: Diagnosis not present

## 2019-10-13 DIAGNOSIS — Z79899 Other long term (current) drug therapy: Secondary | ICD-10-CM | POA: Diagnosis not present

## 2019-10-13 DIAGNOSIS — E785 Hyperlipidemia, unspecified: Secondary | ICD-10-CM | POA: Diagnosis present

## 2019-10-13 DIAGNOSIS — I2694 Multiple subsegmental pulmonary emboli without acute cor pulmonale: Secondary | ICD-10-CM | POA: Diagnosis present

## 2019-10-13 DIAGNOSIS — I2602 Saddle embolus of pulmonary artery with acute cor pulmonale: Secondary | ICD-10-CM

## 2019-10-13 DIAGNOSIS — G4733 Obstructive sleep apnea (adult) (pediatric): Secondary | ICD-10-CM | POA: Diagnosis present

## 2019-10-13 DIAGNOSIS — I2699 Other pulmonary embolism without acute cor pulmonale: Secondary | ICD-10-CM | POA: Diagnosis present

## 2019-10-13 DIAGNOSIS — I10 Essential (primary) hypertension: Secondary | ICD-10-CM | POA: Diagnosis present

## 2019-10-13 DIAGNOSIS — R918 Other nonspecific abnormal finding of lung field: Secondary | ICD-10-CM | POA: Diagnosis present

## 2019-10-13 DIAGNOSIS — Z91013 Allergy to seafood: Secondary | ICD-10-CM | POA: Diagnosis not present

## 2019-10-13 LAB — CBC WITH DIFFERENTIAL/PLATELET
Abs Immature Granulocytes: 0.02 10*3/uL (ref 0.00–0.07)
Basophils Absolute: 0 10*3/uL (ref 0.0–0.1)
Basophils Relative: 0 %
Eosinophils Absolute: 0.1 10*3/uL (ref 0.0–0.5)
Eosinophils Relative: 1 %
HCT: 33.4 % — ABNORMAL LOW (ref 36.0–46.0)
Hemoglobin: 10.6 g/dL — ABNORMAL LOW (ref 12.0–15.0)
Immature Granulocytes: 0 %
Lymphocytes Relative: 33 %
Lymphs Abs: 2.3 10*3/uL (ref 0.7–4.0)
MCH: 30.8 pg (ref 26.0–34.0)
MCHC: 31.7 g/dL (ref 30.0–36.0)
MCV: 97.1 fL (ref 80.0–100.0)
Monocytes Absolute: 0.9 10*3/uL (ref 0.1–1.0)
Monocytes Relative: 13 %
Neutro Abs: 3.8 10*3/uL (ref 1.7–7.7)
Neutrophils Relative %: 53 %
Platelets: 233 10*3/uL (ref 150–400)
RBC: 3.44 MIL/uL — ABNORMAL LOW (ref 3.87–5.11)
RDW: 11.5 % (ref 11.5–15.5)
WBC: 7.2 10*3/uL (ref 4.0–10.5)
nRBC: 0 % (ref 0.0–0.2)

## 2019-10-13 LAB — TROPONIN I (HIGH SENSITIVITY)
Troponin I (High Sensitivity): <2 ng/L (ref <18)
Troponin I (High Sensitivity): <2 ng/L (ref <18)

## 2019-10-13 LAB — COMPREHENSIVE METABOLIC PANEL
ALT: 23 U/L (ref 0–44)
AST: 19 U/L (ref 15–41)
Albumin: 3.4 g/dL — ABNORMAL LOW (ref 3.5–5.0)
Alkaline Phosphatase: 59 U/L (ref 38–126)
Anion gap: 9 (ref 5–15)
BUN: 8 mg/dL (ref 6–20)
CO2: 24 mmol/L (ref 22–32)
Calcium: 8.8 mg/dL — ABNORMAL LOW (ref 8.9–10.3)
Chloride: 110 mmol/L (ref 98–111)
Creatinine, Ser: 0.8 mg/dL (ref 0.44–1.00)
GFR calc Af Amer: >60 mL/min (ref >60)
GFR calc non Af Amer: >60 mL/min (ref >60)
Glucose, Bld: 123 mg/dL — ABNORMAL HIGH (ref 70–99)
Potassium: 3.9 mmol/L (ref 3.5–5.1)
Sodium: 143 mmol/L (ref 135–145)
Total Bilirubin: 0.5 mg/dL (ref 0.3–1.2)
Total Protein: 7.3 g/dL (ref 6.5–8.1)

## 2019-10-13 LAB — HEPARIN LEVEL (UNFRACTIONATED)
Heparin Unfractionated: 0.94 IU/mL — ABNORMAL HIGH (ref 0.30–0.70)
Heparin Unfractionated: 0.43 IU/mL (ref 0.30–0.70)
Heparin Unfractionated: 0.22 IU/mL — ABNORMAL LOW (ref 0.30–0.70)
Heparin Unfractionated: 0.32 IU/mL (ref 0.30–0.70)

## 2019-10-13 LAB — MAGNESIUM: Magnesium: 1.7 mg/dL (ref 1.7–2.4)

## 2019-10-13 LAB — BRAIN NATRIURETIC PEPTIDE: B Natriuretic Peptide: 56.2 pg/mL (ref 0.0–100.0)

## 2019-10-13 LAB — C-REACTIVE PROTEIN: CRP: 8.1 mg/dL — ABNORMAL HIGH (ref <1.0)

## 2019-10-13 LAB — ECHOCARDIOGRAM COMPLETE
Height: 66 in
Weight: 3232 oz

## 2019-10-13 LAB — HIV ANTIBODY (ROUTINE TESTING W REFLEX): HIV Screen 4th Generation wRfx: NONREACTIVE

## 2019-10-13 LAB — D-DIMER, QUANTITATIVE: D-Dimer, Quant: 6.45 ug/mL-FEU — ABNORMAL HIGH (ref 0.00–0.50)

## 2019-10-13 LAB — PHOSPHORUS: Phosphorus: 3 mg/dL (ref 2.5–4.6)

## 2019-10-13 LAB — FERRITIN: Ferritin: 250 ng/mL (ref 11–307)

## 2019-10-13 MED ORDER — HYDROCODONE-ACETAMINOPHEN 5-325 MG PO TABS
1.0000 | ORAL_TABLET | ORAL | Status: DC | PRN
  Administered 2019-10-13: 01:00:00 1 via ORAL
  Administered 2019-10-13 – 2019-10-14 (×3): 2 via ORAL
  Filled 2019-10-13 (×2): qty 1
  Filled 2019-10-13: qty 2
  Filled 2019-10-13: qty 1
  Filled 2019-10-13: qty 2

## 2019-10-13 MED ORDER — ONDANSETRON HCL 4 MG/2ML IJ SOLN: 4.0000 mg | Freq: Four times a day (QID) | INTRAMUSCULAR | Status: DC | PRN

## 2019-10-13 MED ORDER — FESOTERODINE FUMARATE ER 4 MG PO TB24
4.0000 mg | ORAL_TABLET | Freq: Every day | ORAL | Status: DC
  Administered 2019-10-13 – 2019-10-16 (×4): 4 mg via ORAL
  Filled 2019-10-13 (×4): qty 1

## 2019-10-13 MED ORDER — GUAIFENESIN-DM 100-10 MG/5ML PO SYRP
10.0000 mL | ORAL_SOLUTION | ORAL | Status: DC | PRN
  Administered 2019-10-13: 01:00:00 10 mL via ORAL
  Filled 2019-10-13: qty 10

## 2019-10-13 MED ORDER — TOPIRAMATE 100 MG PO TABS
100.0000 mg | ORAL_TABLET | Freq: Every day | ORAL | Status: DC
  Administered 2019-10-13 – 2019-10-15 (×4): 100 mg via ORAL
  Filled 2019-10-13 (×4): qty 1

## 2019-10-13 MED ORDER — SODIUM CHLORIDE 0.9 % IV SOLN: INTRAVENOUS | Status: AC

## 2019-10-13 MED ORDER — POTASSIUM CHLORIDE CRYS ER 20 MEQ PO TBCR
40.0000 meq | EXTENDED_RELEASE_TABLET | Freq: Once | ORAL | Status: AC
  Administered 2019-10-13: 01:00:00 40 meq via ORAL
  Filled 2019-10-13: qty 2

## 2019-10-13 MED ORDER — GABAPENTIN 300 MG PO CAPS
300.0000 mg | ORAL_CAPSULE | Freq: Three times a day (TID) | ORAL | Status: DC
  Administered 2019-10-13 – 2019-10-16 (×10): 300 mg via ORAL
  Filled 2019-10-13 (×10): qty 1

## 2019-10-13 MED ORDER — ALBUTEROL SULFATE HFA 108 (90 BASE) MCG/ACT IN AERS: 1.0000 | INHALATION_SPRAY | Freq: Every day | RESPIRATORY_TRACT | Status: DC | PRN

## 2019-10-13 MED ORDER — HEPARIN (PORCINE) 25000 UT/250ML-% IV SOLN
1250.0000 [IU]/h | INTRAVENOUS | Status: DC
  Administered 2019-10-15 – 2019-10-16 (×2): 1250 [IU]/h via INTRAVENOUS
  Filled 2019-10-13 (×3): qty 250

## 2019-10-13 MED ORDER — SODIUM CHLORIDE 0.9 % IV SOLN
100.0000 mg | Freq: Every day | INTRAVENOUS | Status: AC
  Administered 2019-10-13 – 2019-10-16 (×4): 100 mg via INTRAVENOUS
  Filled 2019-10-13: qty 20
  Filled 2019-10-13 (×2): qty 100
  Filled 2019-10-13: qty 20

## 2019-10-13 MED ORDER — ONDANSETRON HCL 4 MG PO TABS: 4.0000 mg | ORAL_TABLET | Freq: Four times a day (QID) | ORAL | Status: DC | PRN

## 2019-10-13 MED ORDER — MOMETASONE FURO-FORMOTEROL FUM 200-5 MCG/ACT IN AERO
2.0000 | INHALATION_SPRAY | Freq: Two times a day (BID) | RESPIRATORY_TRACT | Status: DC
  Administered 2019-10-13 – 2019-10-16 (×7): 2 via RESPIRATORY_TRACT
  Filled 2019-10-13: qty 8.8

## 2019-10-13 MED ORDER — SODIUM CHLORIDE 0.9 % IV SOLN
200.0000 mg | Freq: Once | INTRAVENOUS | Status: AC
  Administered 2019-10-13: 200 mg via INTRAVENOUS
  Filled 2019-10-13: qty 200

## 2019-10-13 MED ORDER — VITAMIN D (ERGOCALCIFEROL) 1.25 MG (50000 UNIT) PO CAPS: 50000.0000 [IU] | ORAL_CAPSULE | ORAL | Status: DC

## 2019-10-13 MED ORDER — ACETAMINOPHEN 325 MG PO TABS: 650.0000 mg | ORAL_TABLET | Freq: Four times a day (QID) | ORAL | Status: DC | PRN

## 2019-10-13 MED ORDER — SODIUM CHLORIDE 0.9% FLUSH
3.0000 mL | Freq: Two times a day (BID) | INTRAVENOUS | Status: DC
  Administered 2019-10-13 – 2019-10-16 (×8): 3 mL via INTRAVENOUS

## 2019-10-13 NOTE — Progress Notes (Signed)
  Echocardiogram 2D Echocardiogram has been performed.  Veronica Odom 10/13/2019, 2:10 PM

## 2019-10-13 NOTE — CV Procedure (Signed)
2D echo attempted but lab in room. Will try later.

## 2019-10-13 NOTE — Progress Notes (Addendum)
Brief Pharmacy Consult Note - IV heparin for PE  Labs: Heparin level 0.94  A/P: Heparin level SUPRAtherapeutic (goal 0.3-0.7) on current IV heparin rate of 1400 units/hr. Per RN, no bleeding noted. Reduce heparin rate to 1000 units/hr. Recheck heparin level 6 hours after rate decrease.   Hessie Knows, PharmD, BCPS 10/13/2019 1:45 AM

## 2019-10-13 NOTE — Progress Notes (Addendum)
ANTICOAGULATION CONSULT NOTE - Follow Up Consult  Pharmacy Consult for Heparin Indication: pulmonary embolus  Allergies  Allergen Reactions  . Shellfish-Derived Products Anaphylaxis  . Ivp Dye [Iodinated Diagnostic Agents] Hives and Itching  . Latex Hives and Itching  . Penicillins Hives and Swelling    Has patient had a PCN reaction causing immediate rash, facial/tongue/throat swelling, SOB or lightheadedness with hypotension: Yes  Has patient had a PCN reaction causing severe rash involving mucus membranes or skin necrosis: Yes Has patient had a PCN reaction that required hospitalization: Yes Has patient had a PCN reaction occurring within the last 10 years: no If all of the above answers are "NO", then may proceed with Cephalosporin use.     Patient Measurements: Height: 5\' 6"  (167.6 cm) Weight: 202 lb (91.6 kg) IBW/kg (Calculated) : 59.3 Heparin Dosing Weight: 82 kg  Vital Signs: Temp: 98 F (36.7 C) (01/13 0602) BP: 123/83 (01/13 0602) Pulse Rate: 82 (01/13 0602)  Labs: Recent Labs    10/12/19 0855 10/12/19 1039 10/13/19 0036 10/13/19 0305 10/13/19 0903  HGB 12.6  --   --   --   --   HCT 39.0  --   --   --   --   PLT 223  --   --   --   --   HEPARINUNFRC  --   --  0.94*  --  0.43  CREATININE 1.03*  --   --   --   --   TROPONINIHS  --  <2 <2.00 <2.00  --     Estimated Creatinine Clearance: 78.6 mL/min (A) (by C-G formula based on SCr of 1.03 mg/dL (H)).  Assessment: 44 year old female Covid + found to have PE  10/13/2019:  Heparin level therapeutic (0.43) on Heparin 1000 units/hr   CBC WNL  No bleeding or infusion related issues reported by RN  Goal of Therapy:  Heparin level 0.3-0.7 units/ml Monitor platelets by anticoagulation protocol: Yes   Plan:  Continue Heparin infusion at 1000 units / hr Repeat Heparin level in 6 hours Daily heparin level, CBC while on heparin F/U plans for long-term anticoagulation  Thank you 10/15/2019,  PharmD, BCPS 10/13/2019,10:23 AM   Addendum: Confirmatory heparin level now low (0.22) but patient has issues with frequent line occlusions this afternoon.  Issue has been resolved.  Will continue at current rate and re-check heparin level at 8pm.  10/15/2019, PharmD, BCPS 10/13/2019@3 :33 PM

## 2019-10-13 NOTE — Clinical Social Work Note (Signed)
CSW received consult that patient need assistance with medications.  Patient has Medicaid which will pay for medications.  Ervin Knack. Rosaura Bolon, MSW, LCSW [phone]  10/13/2019 2:52 PM

## 2019-10-13 NOTE — Progress Notes (Signed)
PROGRESS NOTE    Veronica Odom  GLO:[id number] DOB: 08-22-1976 DOA: 10/12/2019 PCP: Patria Mane, MD   Brief Narrative:  Veronica Odom is a 44 y.o. female with medical history significant of COVID Positive (10/05/2019), allergy, HTN, vitamin D deficiency, chronic headache, chronic pain, OSA, GERD, HLD   Presented with chest pain and worsening shortness of breath Since her diagnosis and if if she have had a loss of smell but otherwise have not had much of any symptoms up until now when she got suddenly more short of breath.  Cannot take a deep breath.  Had a chest pain worse yesterday.  Still no fever no cough no nausea no vomiting no diarrhea she has never had any prior symptoms. She has history of asthma but usually well controlled and this does not feel like a regular asthma She also reported some right calf tenderness  no TB exposure, no mold exposure Works at [employer]  Assessment & Plan:   Active Problems:   Acute pulmonary embolism (HCC)   COVID-19 virus infection   Pneumonia due to COVID-19 virus   Pulmonary nodules  Acute pulmonary embolism as noted on CT angiogram of the chest Continue with heparin drip Continue with incentive spirometry Bronchodilators and oxygen supplementation  Pneumonia due to COVID-19 virus infection Continue with dexamethasone and remdesivir Empiric antibiotics Oxygen supplementation  Pulmonary nodules Follow-up with pulmonology as outpatient      DVT prophylaxis: Heparin drip  (Code Status: Full code  Family Communication: None (Disposition Plan: Pending clinical improvement, more than 2 nights  Consultants:   None  Procedures: None   Antimicrobials: None  Subjective: Patient was seen and examined at bedside.  On 4 L O2 by nasal cannula.  No new complaints. We will continue with heparin drip for pulmonary embolism. Encourage patient to use incentive spirometry  Objective: Vitals:   10/12/19 2243 10/12/19 2253  10/13/19 0602 10/13/19 1248  BP:  121/84 123/83 111/72  Pulse:  91 82 87  Resp:  20 16 19   Temp:  98.8 F (37.1 C) 98 F (36.7 C) 97.8 F (36.6 C)  TempSrc:    Oral  SpO2:  98% 98% 96%  Weight: 91.6 kg     Height:        Intake/Output Summary (Last 24 hours) at 10/13/2019 1901 Last data filed at 10/13/2019 1800 Gross per 24 hour  Intake 1903.49 ml  Output --  Net 1903.49 ml   Filed Weights   10/12/19 0801 10/12/19 2243  Weight: 92.5 kg 91.6 kg    Examination:  General exam: Appears calm and comfortable  Respiratory system: Clear to auscultation. Respiratory effort normal. Cardiovascular system: S1 & S2 heard, RRR. No JVD, murmurs, rubs, gallops or clicks. No pedal edema. Gastrointestinal system: Abdomen is nondistended, soft and nontender. No organomegaly or masses felt. Normal bowel sounds heard. Central nervous system: Alert and oriented. No focal neurological deficits. Extremities: Symmetric 5 x 5 power. Skin: No rashes, lesions or ulcers Psychiatry: Judgement and insight appear normal. Mood & affect appropriate.     Data Reviewed: I have personally reviewed following labs and imaging studies  CBC: Recent Labs  Lab 10/12/19 0855 10/13/19 1439  WBC 7.5 7.2  NEUTROABS 4.6 3.8  HGB 12.6 10.6*  HCT 39.0 33.4*  MCV 94.9 97.1  PLT 223 233   Basic Metabolic Panel: Recent Labs  Lab 10/12/19 0855 10/13/19 1439  NA 138 143  K 3.2* 3.9  CL 101 110  CO2 23 24  GLUCOSE  159* 123*  BUN 10 8  CREATININE 1.03* 0.80  CALCIUM 9.5 8.8*  MG  --  1.7  PHOS  --  3.0   GFR: Estimated Creatinine Clearance: 101.2 mL/min (by C-G formula based on SCr of 0.8 mg/dL). Liver Function Tests: Recent Labs  Lab 10/12/19 0855 10/13/19 1439  AST 25 19  ALT 27 23  ALKPHOS 66 59  BILITOT 0.6 0.5  PROT 8.5* 7.3  ALBUMIN 4.2 3.4*   Recent Labs  Lab 10/12/19 0855  LIPASE 45   No results for input(s): AMMONIA in the last 168 hours. Coagulation Profile: No results for  input(s): INR, PROTIME in the last 168 hours. Cardiac Enzymes: No results for input(s): CKTOTAL, CKMB, CKMBINDEX, TROPONINI in the last 168 hours. BNP (last 3 results) No results for input(s): PROBNP in the last 8760 hours. HbA1C: No results for input(s): HGBA1C in the last 72 hours. CBG: No results for input(s): GLUCAP in the last 168 hours. Lipid Profile: Recent Labs    10/12/19 1606  TRIG 135   Thyroid Function Tests: No results for input(s): TSH, T4TOTAL, FREET4, T3FREE, THYROIDAB in the last 72 hours. Anemia Panel: Recent Labs    10/12/19 1606 10/13/19 1439  FERRITIN 305 250   Sepsis Labs: Recent Labs  Lab 10/12/19 1606 10/12/19 1815  PROCALCITON <0.10  --   LATICACIDVEN 2.4* 1.5    Recent Results (from the past 240 hour(s))  Novel Coronavirus, NAA (Hosp order, Send-out to Ref Lab; TAT 18-24 hrs     Status: Abnormal   Collection Time: 10/04/19  9:30 AM   Specimen: Nasopharyngeal Swab; Respiratory  Result Value Ref Range Status   SARS-CoV-2, NAA DETECTED (A) NOT DETECTED Final    Comment: RESULT CALLED TO, READ BACK BY AND VERIFIED WITH: (NOTE)                  Client Requested Flag This nucleic acid amplification test was developed and its performance characteristics determined by [facility]. Nucleic acid amplification tests include PCR and TMA. This test has not been FDA cleared or approved. This test has been authorized by FDA under an Emergency Use Authorization (EUA). This test is only authorized for the duration of time the declaration that circumstances exist justifying the authorization of the emergency use of in vitro diagnostic tests for detection of SARS-CoV-2 virus and/or diagnosis of COVID-19 infection under section 564(b)(1) of the Act, 21 U.S.C. 161WRU-0(A360bbb-3(b) (1), unless the authorization is terminated or revoked sooner. When diagnostic testing is negative, the possibility of a false negative result should be considered in the context  of a patient's recent exposures and the presence of clinical signs and symptoms consistent with C OVID-19. An individual without symptoms of COVID- 19 and who is not shedding SARS-CoV-2 virus would expect to have a negative (not detected) result in this assay. Performed At: [facility] [address] [place], [place] [id number] Jolene SchimkeNagendra Sanjai MD YN:[id number]:[phone]    Coronavirus Source NASOPHARYNGEAL  Final    Comment: RN Cher NakaiLORI BERDIK  10/05/2019 1:14 PM FCP Performed at [facility], [address] [place], [place] [address]   Blood Culture (routine x 2)     Status: None (Preliminary result)   Collection Time: 10/12/19  3:45 PM   Specimen: BLOOD  Result Value Ref Range Status   Specimen Description   Final    BLOOD LEFT ANTECUBITAL Performed at [facility] [place], [address] [place], [place] [address]    Special  Requests   Final    BOTTLES DRAWN AEROBIC ONLY Blood Culture adequate volume Performed at [facility], [address] [place], [place] [address]    Culture   Final    NO GROWTH < 24 HOURS Performed at [facility], [address], [place], [place] [address]    Report Status PENDING  Incomplete  Blood Culture (routine x 2)     Status: None (Preliminary result)   Collection Time: 10/12/19  4:06 PM   Specimen: BLOOD  Result Value Ref Range Status   Specimen Description   Final    BLOOD RIGHT ANTECUBITAL Performed at [facility], [address] [place], [place] [address]    Special Requests   Final    BOTTLES DRAWN AEROBIC AND ANAEROBIC Blood Culture adequate volume Performed at [facility], [address] [place], [place] [address]    Culture   Final    NO GROWTH < 24 HOURS Performed at [facility], [address] [place], [place] [address]    Report Status PENDING  Incomplete         Radiology Studies: CT Angio Chest PE W/Cm &/Or Wo Cm  Result Date: 10/12/2019 CLINICAL DATA:   Shortness of breath.  Chest pain.  COVID-19 positive EXAM: CT ANGIOGRAPHY CHEST WITH CONTRAST TECHNIQUE: Multidetector CT imaging of the chest was performed using the standard protocol during bolus administration of intravenous contrast. Multiplanar CT image reconstructions and MIPs were obtained to evaluate the vascular anatomy. CONTRAST:  OMNIPAQUE IOHEXOL 350 MG/ML SOLN COMPARISON:  Chest radiograph October 12, 2019. FINDINGS: Cardiovascular: There are pulmonary emboli arising from the distal left main pulmonary artery with extension into multiple lower lobe pulmonary arterial vascular structures on the left. There is a small pulmonary embolus in the posterior segment left upper lobe pulmonary artery as well. No pulmonary emboli to the right of midline or appreciable. The right ventricle to left ventricle diameter ratio is less than 0.9, not indicative of right heart strain. There is no thoracic aortic aneurysm or dissection. Visualized great vessels appear normal. There is no pericardial effusion or pericardial thickening. Mediastinum/Nodes: Visualized thyroid appears unremarkable. There is no appreciable thoracic adenopathy by size criteria. There are occasional subcentimeter lymph nodes evident. No esophageal lesions are appreciable. Lungs/Pleura: There are multiple somewhat nodular appearing areas of opacity throughout the lungs consistent with multifocal pneumonia. There is suggestion of early cavitation in a somewhat nodular lesion in the superior segment of the left lower lobe. More patchy airspace opacity is noted posteriorly in the lung bases. No pleural effusions are evident. No well-defined pulmonary infarct is evident. Upper Abdomen: There is a cyst arising from the posterior upper pole of the left kidney measuring 2.2 x 2.2 cm. Gallbladder is absent. Visualized upper abdominal structures otherwise appear unremarkable. Musculoskeletal: No blastic or lytic bone lesions are evident. No chest wall  lesions appreciable. Review of the MIP images confirms the above findings. IMPRESSION: 1. Multiple left-sided pulmonary emboli with embolism arising from the distal most aspect of the left main pulmonary artery. No appreciable pulmonary embolus on the right evident. No right heart strain. No thoracic aortic aneurysm or dissection. 2. Multifocal pneumonia with multiple somewhat nodular opacities bilaterally. There is subtle cavitation in a superior segment left lower lobe area of infiltrate. There may be septic emboli associated with the pneumonia. More patchy infiltrate is noted in the posterior lung bases. 3.  No adenopathy by  size criteria. Critical Value/emergent results were called by telephone at the time of interpretation on 10/12/2019 at 3:33 pm to [facility] , who verbally acknowledged these results. Electronically Signed   By: Lowella Grip III M.D.   On: 10/12/2019 15:33   US Venous Img Lower Unilateral Right  Result Date: 10/12/2019 CLINICAL DATA:  Right calf pain. EXAM: Right LOWER EXTREMITY VENOUS DOPPLER ULTRASOUND TECHNIQUE: Gray-scale sonography with graded compression, as well as color Doppler and duplex ultrasound were performed to evaluate the lower extremity deep venous systems from the level of the common femoral vein and including the common femoral, femoral, profunda femoral, popliteal and calf veins including the posterior tibial, peroneal and gastrocnemius veins when visible. The superficial great saphenous vein was also interrogated. Spectral Doppler was utilized to evaluate flow at rest and with distal augmentation maneuvers in the common femoral, femoral and popliteal veins. COMPARISON:  None. FINDINGS: Contralateral Common Femoral Vein: Respiratory phasicity is normal and symmetric with the symptomatic side. No evidence of thrombus. Normal compressibility. Common Femoral Vein: No evidence of thrombus. Normal compressibility, respiratory phasicity and response to  augmentation. Saphenofemoral Junction: No evidence of thrombus. Normal compressibility and flow on color Doppler imaging. Profunda Femoral Vein: No evidence of thrombus. Normal compressibility and flow on color Doppler imaging. Femoral Vein: No evidence of thrombus. Normal compressibility, respiratory phasicity and response to augmentation. Popliteal Vein: No evidence of thrombus. Normal compressibility, respiratory phasicity and response to augmentation. Calf Veins: No evidence of thrombus. Normal compressibility and flow on color Doppler imaging. Superficial Great Saphenous Vein: No evidence of thrombus. Normal compressibility. Venous Reflux:  None. Other Findings:  None. IMPRESSION: No evidence of deep venous thrombosis seen in right lower extremity. Electronically Signed   By: Marijo Conception M.D.   On: 10/12/2019 18:58   DG Chest Port 1 View  Result Date: 10/12/2019 CLINICAL DATA:  Chest pain and shortness of breath EXAM: PORTABLE CHEST 1 VIEW COMPARISON:  November 24, 2016 FINDINGS: The lungs are clear. Heart size and pulmonary vascularity are normal. No adenopathy. No pneumothorax. No bone lesions. IMPRESSION: Lungs clear. Cardiac silhouette within normal limits. No evident adenopathy. Electronically Signed   By: Lowella Grip III M.D.   On: 10/12/2019 08:30   ECHOCARDIOGRAM COMPLETE  Result Date: 10/13/2019   ECHOCARDIOGRAM REPORT   Patient Name:   KEYARIA LAWSON Date of Exam: 10/13/2019 Medical Rec #:  [id number]    Height:       66.0 in Accession #:    [id number]   Weight:       202.0 lb Date of Birth:  03-03-1976    BSA:          2.01 m Patient Age:    38 years     BP:           123/83 mmHg Patient Gender: F            HR:           79 bpm. Exam Location:  Inpatient Procedure: 2D Echo, Color Doppler and Cardiac Doppler Indications:    I26.02 Pulmonary embolus  History:        Patient has prior history of Echocardiogram examinations, most                 recent 12/30/2017. Signs/Symptoms:Dyspnea and  Chest Pain; Risk                 Factors:Hypertension and Dyslipidemia. Covid 19 positive.  Pneumonia.  Sonographer:    Sheralyn Boatman RDCS Referring Phys: [id number] ANASTASSIA DOUTOVA  Sonographer Comments: Technically difficult study due to poor echo windows, suboptimal subcostal window, suboptimal apical window and patient is morbidly obese. Image acquisition challenging due to patient body habitus. IMPRESSIONS  1. Left ventricular ejection fraction, by visual estimation, is 55 to 60%. The left ventricle has normal function. There is mildly increased left ventricular hypertrophy. Normal diastolic function.  2. The left ventricle has no regional wall motion abnormalities.  3. Global right ventricle has normal systolic function.The right ventricular size is normal. No increase in right ventricular wall thickness.  4. Left atrial size was normal.  5. Right atrial size was normal.  6. Trivial pericardial effusion is present.  7. The mitral valve is normal in structure. No evidence of mitral valve regurgitation. No evidence of mitral stenosis.  8. The tricuspid valve is normal in structure.  9. The aortic valve is normal in structure. Aortic valve regurgitation is not visualized. No evidence of aortic valve sclerosis or stenosis. 10. The tricuspid regurgitant velocity is 2.54 m/s, and with an assumed right atrial pressure of 3 mmHg, the estimated right ventricular systolic pressure is normal at 28.8 mmHg. 11. The inferior vena cava is normal in size with greater than 50% respiratory variability, suggesting right atrial pressure of 3 mmHg. FINDINGS  Left Ventricle: Left ventricular ejection fraction, by visual estimation, is 55 to 60%. The left ventricle has normal function. The left ventricle has no regional wall motion abnormalities. The left ventricular internal cavity size was the left ventricle is normal in size. There is mildly increased left ventricular hypertrophy. Left ventricular diastolic parameters  were normal. Right Ventricle: The right ventricular size is normal. No increase in right ventricular wall thickness. Global RV systolic function is has normal systolic function. The tricuspid regurgitant velocity is 2.54 m/s, and with an assumed right atrial pressure  of 3 mmHg, the estimated right ventricular systolic pressure is normal at 28.8 mmHg. Left Atrium: Left atrial size was normal in size. Right Atrium: Right atrial size was normal in size Pericardium: Trivial pericardial effusion is present. Mitral Valve: The mitral valve is normal in structure. No evidence of mitral valve regurgitation. No evidence of mitral valve stenosis by observation. Tricuspid Valve: The tricuspid valve is normal in structure. Tricuspid valve regurgitation is trivial. Aortic Valve: The aortic valve is normal in structure. Aortic valve regurgitation is not visualized. The aortic valve is structurally normal, with no evidence of sclerosis or stenosis. Pulmonic Valve: The pulmonic valve was normal in structure. Pulmonic valve regurgitation is trivial. Pulmonic regurgitation is trivial. Aorta: The aortic root is normal in size and structure. Venous: The inferior vena cava is normal in size with greater than 50% respiratory variability, suggesting right atrial pressure of 3 mmHg. IAS/Shunts: No atrial level shunt detected by color flow Doppler.  LEFT VENTRICLE PLAX 2D LVIDd:         4.30 cm       Diastology LVIDs:         3.00 cm       LV e' lateral:   12.70 cm/s LV PW:         1.20 cm       LV E/e' lateral: 5.6 LV IVS:        1.20 cm       LV e' medial:    8.27 cm/s LVOT diam:     2.00 cm       LV E/e'  medial:  8.6 LV SV:         48 ml LV SV Index:   22.91 LVOT Area:     3.14 cm  LV Volumes (MOD) LV area d, A2C:    24.30 cm LV area d, A4C:    24.70 cm LV area s, A2C:    15.20 cm LV area s, A4C:    13.50 cm LV major d, A2C:   7.18 cm LV major d, A4C:   6.89 cm LV major s, A2C:   6.13 cm LV major s, A4C:   6.35 cm LV vol d, MOD A2C:  70.0 ml LV vol d, MOD A4C: 71.7 ml LV vol s, MOD A2C: 32.4 ml LV vol s, MOD A4C: 24.2 ml LV SV MOD A2C:     37.6 ml LV SV MOD A4C:     71.7 ml LV SV MOD BP:      43.4 ml RIGHT VENTRICLE            IVC RV S prime:     8.92 cm/s  IVC diam: 1.50 cm TAPSE (M-mode): 1.4 cm LEFT ATRIUM             Index       RIGHT ATRIUM           Index LA diam:        2.80 cm 1.39 cm/m  RA Area:     12.30 cm LA Vol (A2C):   23.6 ml 11.75 ml/m RA Volume:   28.20 ml  14.04 ml/m LA Vol (A4C):   21.9 ml 10.90 ml/m LA Biplane Vol: 23.8 ml 11.85 ml/m  AORTIC VALVE             PULMONIC VALVE LVOT Vmax:   83.50 cm/s  PR End Diast Vel: 1.67 msec LVOT Vmean:  54.900 cm/s LVOT VTI:    0.167 m  AORTA Ao Root diam: 3.00 cm Ao Asc diam:  3.10 cm MITRAL VALVE                        TRICUSPID VALVE MV Area (PHT): 3.72 cm             TR Peak grad:   25.8 mmHg MV PHT:        59.16 msec           TR Vmax:        254.00 cm/s MV Decel Time: 204 msec MV E velocity: 71.30 cm/s 103 cm/s  SHUNTS MV A velocity: 67.90 cm/s 70.3 cm/s Systemic VTI:  0.17 m MV E/A ratio:  1.05       1.5       Systemic Diam: 2.00 cm  Marca Ancona MD Electronically signed by Marca Ancona MD Signature Date/Time: 10/13/2019/4:35:42 PM    Final         Scheduled Meds: . fesoterodine  4 mg Oral Daily  . gabapentin  300 mg Oral TID  . mometasone-formoterol  2 puff Inhalation BID  . sodium chloride flush  3 mL Intravenous Q12H  . topiramate  100 mg Oral QHS  . [START ON 10/19/2019] Vitamin D (Ergocalciferol)  50,000 Units Oral Weekly   Continuous Infusions: . heparin 1,000 Units/hr (10/13/19 1016)  . remdesivir 100 mg in NS 100 mL Stopped (10/13/19 1013)     LOS: 0 days    Time spent: 19    Aquilla Hacker, MD [facility] Pager [phone]   If  7PM-7AM, please contact night-coverage [email or web address] Password [email or web address] 10/13/2019, 7:01 PM

## 2019-10-13 NOTE — Progress Notes (Signed)
ANTICOAGULATION CONSULT NOTE - Follow Up Consult  Pharmacy Consult for Heparin Indication: pulmonary embolus  Allergies  Allergen Reactions  . Shellfish-Derived Products Anaphylaxis  . Ivp Dye [Iodinated Diagnostic Agents] Hives and Itching  . Latex Hives and Itching  . Penicillins Hives and Swelling    Has patient had a PCN reaction causing immediate rash, facial/tongue/throat swelling, SOB or lightheadedness with hypotension: Yes  Has patient had a PCN reaction causing severe rash involving mucus membranes or skin necrosis: Yes Has patient had a PCN reaction that required hospitalization: Yes Has patient had a PCN reaction occurring within the last 10 years: no If all of the above answers are "NO", then may proceed with Cephalosporin use.     Patient Measurements: Height: 5\' 6"  (167.6 cm) Weight: 202 lb (91.6 kg) IBW/kg (Calculated) : 59.3 Heparin Dosing Weight: 82 kg  Vital Signs: Temp: 98.8 F (37.1 C) (01/13 2004) Temp Source: Oral (01/13 2004) BP: 113/82 (01/13 2004) Pulse Rate: 95 (01/13 2004)  Labs: Recent Labs    10/12/19 0817 10/12/19 0855 10/12/19 1039 10/13/19 0036 10/13/19 0305 10/13/19 0903 10/13/19 1439 10/13/19 1953  HGB  --  12.6  --   --   --   --  10.6*  --   HCT  --  39.0  --   --   --   --  33.4*  --   PLT  --  223  --   --   --   --  233  --   HEPARINUNFRC   < >  --   --  0.94*  --  0.43 0.22* 0.32  CREATININE  --  1.03*  --   --   --   --  0.80  --   TROPONINIHS  --   --  <2 <2.00 <2.00  --   --   --    < > = values in this interval not displayed.    Estimated Creatinine Clearance: 101.2 mL/min (by C-G formula based on SCr of 0.8 mg/dL).  Assessment: 44 year old female Covid + found to have PE  10/13/2019:  Heparin level was low earlier due to issues with frequent line occlusions> line issue was resolved.  Heparin level drawn at 19:53 is therapeutic at 0.32 on rate of 1000 units/hr  No bleeding or infusion related issues reported  by RN  Goal of Therapy:  Heparin level 0.3-0.7 units/ml Monitor platelets by anticoagulation protocol: Yes   Plan:  Increase Heparin infusion to 1050 units / hr Daily heparin level, CBC while on heparin F/U plans for long-term anticoagulation  10/15/2019, Pharm.D [phone] 10/13/2019 8:17 PM

## 2019-10-13 NOTE — Plan of Care (Signed)
  Problem: Education: Goal: Knowledge of General Education information will improve Description: Including pain rating scale, medication(s)/side effects and non-pharmacologic comfort measures Outcome: Progressing   Problem: Health Behavior/Discharge Planning: Goal: Ability to manage health-related needs will improve Outcome: Progressing   Problem: Clinical Measurements: Goal: Ability to maintain clinical measurements within normal limits will improve Outcome: Progressing Goal: Will remain free from infection Outcome: Completed/Met Goal: Diagnostic test results will improve Outcome: Progressing Goal: Respiratory complications will improve Outcome: Progressing Goal: Cardiovascular complication will be avoided Outcome: Progressing   Problem: Activity: Goal: Risk for activity intolerance will decrease Outcome: Progressing   Problem: Nutrition: Goal: Adequate nutrition will be maintained Outcome: Progressing   Problem: Coping: Goal: Level of anxiety will decrease Outcome: Progressing   Problem: Elimination: Goal: Will not experience complications related to bowel motility Outcome: Progressing Goal: Will not experience complications related to urinary retention Outcome: Completed/Met   Problem: Pain Managment: Goal: General experience of comfort will improve Outcome: Progressing   Problem: Safety: Goal: Ability to remain free from injury will improve Outcome: Progressing   Problem: Skin Integrity: Goal: Risk for impaired skin integrity will decrease Outcome: Progressing   Problem: Education: Goal: Knowledge of risk factors and measures for prevention of condition will improve Outcome: Progressing   Problem: Coping: Goal: Psychosocial and spiritual needs will be supported Outcome: Progressing   Problem: Respiratory: Goal: Will maintain a patent airway Outcome: Completed/Met Goal: Complications related to the disease process, condition or treatment will be  avoided or minimized Outcome: Progressing

## 2019-10-14 LAB — HEPARIN LEVEL (UNFRACTIONATED)
Heparin Unfractionated: 0.26 IU/mL — ABNORMAL LOW (ref 0.30–0.70)
Heparin Unfractionated: 0.41 IU/mL (ref 0.30–0.70)
Heparin Unfractionated: 0.4 IU/mL (ref 0.30–0.70)

## 2019-10-14 LAB — D-DIMER, QUANTITATIVE: D-Dimer, Quant: 5.45 ug/mL-FEU — ABNORMAL HIGH (ref 0.00–0.50)

## 2019-10-14 LAB — CBC WITH DIFFERENTIAL/PLATELET
Abs Immature Granulocytes: 0.03 10*3/uL (ref 0.00–0.07)
Basophils Absolute: 0 10*3/uL (ref 0.0–0.1)
Basophils Relative: 1 %
Eosinophils Absolute: 0.1 10*3/uL (ref 0.0–0.5)
Eosinophils Relative: 2 %
HCT: 32.2 % — ABNORMAL LOW (ref 36.0–46.0)
Hemoglobin: 10.2 g/dL — ABNORMAL LOW (ref 12.0–15.0)
Immature Granulocytes: 0 %
Lymphocytes Relative: 41 %
Lymphs Abs: 3 10*3/uL (ref 0.7–4.0)
MCH: 31 pg (ref 26.0–34.0)
MCHC: 31.7 g/dL (ref 30.0–36.0)
MCV: 97.9 fL (ref 80.0–100.0)
Monocytes Absolute: 0.9 10*3/uL (ref 0.1–1.0)
Monocytes Relative: 12 %
Neutro Abs: 3.4 10*3/uL (ref 1.7–7.7)
Neutrophils Relative %: 44 %
Platelets: 260 10*3/uL (ref 150–400)
RBC: 3.29 MIL/uL — ABNORMAL LOW (ref 3.87–5.11)
RDW: 11.5 % (ref 11.5–15.5)
WBC: 7.4 10*3/uL (ref 4.0–10.5)
nRBC: 0 % (ref 0.0–0.2)

## 2019-10-14 LAB — COMPREHENSIVE METABOLIC PANEL
ALT: 23 U/L (ref 0–44)
AST: 16 U/L (ref 15–41)
Albumin: 3.5 g/dL (ref 3.5–5.0)
Alkaline Phosphatase: 57 U/L (ref 38–126)
Anion gap: 9 (ref 5–15)
BUN: 9 mg/dL (ref 6–20)
CO2: 23 mmol/L (ref 22–32)
Calcium: 8.9 mg/dL (ref 8.9–10.3)
Chloride: 108 mmol/L (ref 98–111)
Creatinine, Ser: 0.85 mg/dL (ref 0.44–1.00)
GFR calc Af Amer: >60 mL/min (ref >60)
GFR calc non Af Amer: >60 mL/min (ref >60)
Glucose, Bld: 86 mg/dL (ref 70–99)
Potassium: 3.7 mmol/L (ref 3.5–5.1)
Sodium: 140 mmol/L (ref 135–145)
Total Bilirubin: 0.4 mg/dL (ref 0.3–1.2)
Total Protein: 7.4 g/dL (ref 6.5–8.1)

## 2019-10-14 LAB — PHOSPHORUS: Phosphorus: 3.3 mg/dL (ref 2.5–4.6)

## 2019-10-14 LAB — FERRITIN: Ferritin: 297 ng/mL (ref 11–307)

## 2019-10-14 LAB — MAGNESIUM: Magnesium: 1.7 mg/dL (ref 1.7–2.4)

## 2019-10-14 LAB — ABO/RH: ABO/RH(D): B POS

## 2019-10-14 LAB — C-REACTIVE PROTEIN: CRP: 11.3 mg/dL — ABNORMAL HIGH (ref <1.0)

## 2019-10-14 NOTE — Progress Notes (Signed)
Taking over care of patient. Agree with previous RN assessment. Denies any needs at this time will continue to monitor.  

## 2019-10-14 NOTE — Progress Notes (Signed)
ANTICOAGULATION CONSULT NOTE - Follow Up Consult  Pharmacy Consult for Heparin Indication: pulmonary embolus  Allergies  Allergen Reactions  . Shellfish-Derived Products Anaphylaxis  . Ivp Dye [Iodinated Diagnostic Agents] Hives and Itching  . Latex Hives and Itching  . Penicillins Hives and Swelling    Has patient had a PCN reaction causing immediate rash, facial/tongue/throat swelling, SOB or lightheadedness with hypotension: Yes  Has patient had a PCN reaction causing severe rash involving mucus membranes or skin necrosis: Yes Has patient had a PCN reaction that required hospitalization: Yes Has patient had a PCN reaction occurring within the last 10 years: no If all of the above answers are "NO", then may proceed with Cephalosporin use.     Patient Measurements: Height: 5\' 6"  (167.6 cm) Weight: 202 lb (91.6 kg) IBW/kg (Calculated) : 59.3 Heparin Dosing Weight: 79.4 kg  Vital Signs: Temp: 98.8 F (37.1 C) (01/14 0547) BP: 115/77 (01/14 0547) Pulse Rate: 93 (01/14 0547)  Labs: Recent Labs     0000 10/12/19 0817 10/12/19 0855 10/12/19 1039 10/13/19 0036 10/13/19 0305 10/13/19 0903 10/13/19 1439 10/13/19 1439 10/13/19 1953 10/14/19 0312 10/14/19 1138  HGB   < >  --  12.6  --   --   --   --  10.6*  --   --  10.2*  --   HCT  --   --  39.0  --   --   --   --  33.4*  --   --  32.2*  --   PLT  --   --  223  --   --   --   --  233  --   --  260  --   HEPARINUNFRC  --   --   --   --  0.94*  --    < > 0.22*   < > 0.32 0.26* 0.41  CREATININE  --   --  1.03*  --   --   --   --  0.80  --   --  0.85  --   TROPONINIHS  --    < >  --  <2 <2.00 <2.00  --   --   --   --   --   --    < > = values in this interval not displayed.    Estimated Creatinine Clearance: 95.3 mL/min (by C-G formula based on SCr of 0.85 mg/dL).  Assessment: 44 year old female Covid + found to have PE  10/14/2019:  1138 heparin level = 0.41 units/mL, now therapeutic  CBC: Hgb 10.2, Pltc WNL  No  bleeding or infusion related issues reported by RN  Goal of Therapy:  Heparin level 0.3-0.7 units/ml Monitor platelets by anticoagulation protocol: Yes   Plan:  Continue heparin infusion at 1250 units/hr Heparin level in 6 hours to confirm remains within therapeutic range Daily heparin level, CBC while on heparin F/U plans for long-term anticoagulation   10/16/2019, PharmD, BCPS Clinical Pharmacist  10/14/2019 12:25 PM

## 2019-10-14 NOTE — Progress Notes (Signed)
PROGRESS NOTE    Veronica Odom  WNI:[id number] DOB: Feb 18, 1976 DOA: 10/12/2019 PCP: Patria Mane, MD   Brief Narrative:  Veronica Odom is a 44 y.o. female with medical history significant of COVID Positive (10/05/2019), allergy, HTN, vitamin D deficiency, chronic headache, chronic pain, OSA, GERD, HLD   Presented with chest pain and worsening shortness of breath Since her diagnosis and if if she have had a loss of smell but otherwise have not had much of any symptoms up until now when she got suddenly more short of breath.  Cannot take a deep breath.  Had a chest pain worse yesterday.  Still no fever no cough no nausea no vomiting no diarrhea she has never had any prior symptoms. She has history of asthma but usually well controlled and this does not feel like a regular asthma She also reported some right calf tenderness  no TB exposure, no mold exposure Works at [employer]  Assessment & Plan:   Active Problems:   Acute pulmonary embolism (HCC)   COVID-19 virus infection   Pneumonia due to COVID-19 virus   Pulmonary nodules  Acute pulmonary embolism as noted on CT angiogram of the chest Continue with heparin drip Continue with incentive spirometry Bronchodilators Patient is saturating above 90% on room air. Will consider discharging home on oral anticoagulation.   Pneumonia due to COVID-19 virus infection Continue with dexamethasone and remdesivir Empiric antibiotics Oxygen supplementation as needed  Pulmonary nodules Follow-up with pulmonology as outpatient      DVT prophylaxis: Heparin drip  (Code Status: Full code  Family Communication: None (Disposition Plan: Pending clinical improvement, more than 2 nights  Consultants:   None  Procedures: None   Antimicrobials: None  Subjective: Patient was seen and examined at bedside.  Saturating at 90% on room air  No new complaints. We will continue with heparin drip for pulmonary embolism. Encourage  patient to use incentive spirometry  Objective: Vitals:   10/14/19 0503 10/14/19 0547 10/14/19 1339 10/14/19 1340  BP: 105/77 115/77  108/69  Pulse: 100 93  97  Resp:  20    Temp:  98.8 F (37.1 C) 98.6 F (37 C)   TempSrc:   Oral   SpO2: 98% 98%  97%  Weight:      Height:        Intake/Output Summary (Last 24 hours) at 10/14/2019 1654 Last data filed at 10/14/2019 1500 Gross per 24 hour  Intake 585.19 ml  Output --  Net 585.19 ml   Filed Weights   10/12/19 0801 10/12/19 2243  Weight: 92.5 kg 91.6 kg    Examination:  General exam: Appears calm and comfortable  Respiratory system: Clear to auscultation. Respiratory effort normal. Cardiovascular system: S1 & S2 heard, RRR. No JVD, murmurs, rubs, gallops or clicks. No pedal edema. Gastrointestinal system: Abdomen is nondistended, soft and nontender. No organomegaly or masses felt. Normal bowel sounds heard. Central nervous system: Alert and oriented. No focal neurological deficits. Extremities: Symmetric 5 x 5 power. Skin: No rashes, lesions or ulcers Psychiatry: Judgement and insight appear normal. Mood & affect appropriate.     Data Reviewed: I have personally reviewed following labs and imaging studies  CBC: Recent Labs  Lab 10/12/19 0855 10/13/19 1439 10/14/19 0312  WBC 7.5 7.2 7.4  NEUTROABS 4.6 3.8 3.4  HGB 12.6 10.6* 10.2*  HCT 39.0 33.4* 32.2*  MCV 94.9 97.1 97.9  PLT 223 233 260   Basic Metabolic Panel: Recent Labs  Lab 10/12/19 0855 10/13/19 1439  10/14/19 0312  NA 138 143 140  K 3.2* 3.9 3.7  CL 101 110 108  CO2 23 24 23   GLUCOSE 159* 123* 86  BUN 10 8 9   CREATININE 1.03* 0.80 0.85  CALCIUM 9.5 8.8* 8.9  MG  --  1.7 1.7  PHOS  --  3.0 3.3   GFR: Estimated Creatinine Clearance: 95.3 mL/min (by C-G formula based on SCr of 0.85 mg/dL). Liver Function Tests: Recent Labs  Lab 10/12/19 0855 10/13/19 1439 10/14/19 0312  AST 25 19 16   ALT 27 23 23   ALKPHOS 66 59 57  BILITOT 0.6 0.5 0.4   PROT 8.5* 7.3 7.4  ALBUMIN 4.2 3.4* 3.5   Recent Labs  Lab 10/12/19 0855  LIPASE 45   No results for input(s): AMMONIA in the last 168 hours. Coagulation Profile: No results for input(s): INR, PROTIME in the last 168 hours. Cardiac Enzymes: No results for input(s): CKTOTAL, CKMB, CKMBINDEX, TROPONINI in the last 168 hours. BNP (last 3 results) No results for input(s): PROBNP in the last 8760 hours. HbA1C: No results for input(s): HGBA1C in the last 72 hours. CBG: No results for input(s): GLUCAP in the last 168 hours. Lipid Profile: Recent Labs    10/12/19 1606  TRIG 135   Thyroid Function Tests: No results for input(s): TSH, T4TOTAL, FREET4, T3FREE, THYROIDAB in the last 72 hours. Anemia Panel: Recent Labs    10/13/19 1439 10/14/19 0312  FERRITIN 250 297   Sepsis Labs: Recent Labs  Lab 10/12/19 1606 10/12/19 1815  PROCALCITON <0.10  --   LATICACIDVEN 2.4* 1.5    Recent Results (from the past 240 hour(s))  Blood Culture (routine x 2)     Status: None (Preliminary result)   Collection Time: 10/12/19  3:45 PM   Specimen: BLOOD  Result Value Ref Range Status   Specimen Description   Final    BLOOD LEFT ANTECUBITAL Performed at [facility], [address] [place], [facility] [address]    Special Requests   Final    BOTTLES DRAWN AEROBIC ONLY Blood Culture adequate volume Performed at [facility], [address] [place], [address] [facility]    Culture   Final    NO GROWTH 2 DAYS Performed at [facility], [address], [place], [address] [facility]    Report Status PENDING  Incomplete  Blood Culture (routine x 2)     Status: None (Preliminary result)   Collection Time: 10/12/19  4:06 PM   Specimen: BLOOD  Result Value Ref Range Status   Specimen Description   Final    BLOOD RIGHT ANTECUBITAL Performed at [facility], [address] [place], 12/10/19 [facility]    Special Requests   Final    BOTTLES DRAWN AEROBIC  AND ANAEROBIC Blood Culture adequate volume Performed at [facility], [address] [place], [address] [facility]    Culture   Final    NO GROWTH 2 DAYS Performed at [facility], [address], [place], [address] [facility]    Report Status PENDING  Incomplete         Radiology Studies: [address] Venous Img Lower Unilateral Right  Result Date: 10/12/2019 CLINICAL DATA:  Right calf pain. EXAM: Right LOWER EXTREMITY VENOUS DOPPLER ULTRASOUND TECHNIQUE: Gray-scale sonography with graded compression, as well as color Doppler and duplex ultrasound were performed to evaluate the lower extremity deep venous systems from the level of the  common femoral vein and including the common femoral, femoral, profunda femoral, popliteal and calf veins including the posterior tibial, peroneal and gastrocnemius veins when visible. The superficial great saphenous vein was also interrogated. Spectral Doppler was utilized to evaluate flow at rest and with distal augmentation maneuvers in the common femoral, femoral and popliteal veins. COMPARISON:  None. FINDINGS: Contralateral Common Femoral Vein: Respiratory phasicity is normal and symmetric with the symptomatic side. No evidence of thrombus. Normal compressibility. Common Femoral Vein: No evidence of thrombus. Normal compressibility, respiratory phasicity and response to augmentation. Saphenofemoral Junction: No evidence of thrombus. Normal compressibility and flow on color Doppler imaging. Profunda Femoral Vein: No evidence of thrombus. Normal compressibility and flow on color Doppler imaging. Femoral Vein: No evidence of thrombus. Normal compressibility, respiratory phasicity and response to augmentation. Popliteal Vein: No evidence of thrombus. Normal compressibility, respiratory phasicity and response to augmentation. Calf Veins: No evidence of thrombus. Normal compressibility and flow on color Doppler imaging. Superficial Great Saphenous Vein: No evidence  of thrombus. Normal compressibility. Venous Reflux:  None. Other Findings:  None. IMPRESSION: No evidence of deep venous thrombosis seen in right lower extremity. Electronically Signed   By: Marijo Conception M.D.   On: 10/12/2019 18:58   ECHOCARDIOGRAM COMPLETE  Result Date: 10/13/2019   ECHOCARDIOGRAM REPORT   Patient Name:   KADASIA KASSING Date of Exam: 10/13/2019 Medical Rec #:  [id number]    Height:       66.0 in Accession #:    [id number]   Weight:       202.0 lb Date of Birth:  1975-12-14    BSA:          2.01 m Patient Age:    27 years     BP:           123/83 mmHg Patient Gender: F            HR:           79 bpm. Exam Location:  Inpatient Procedure: 2D Echo, Color Doppler and Cardiac Doppler Indications:    I26.02 Pulmonary embolus  History:        Patient has prior history of Echocardiogram examinations, most                 recent 12/30/2017. Signs/Symptoms:Dyspnea and Chest Pain; Risk                 Factors:Hypertension and Dyslipidemia. Covid 19 positive.                 Pneumonia.  Sonographer:    Roseanna Rainbow RDCS Referring Phys: [place]  Sonographer Comments: Technically difficult study due to poor echo windows, suboptimal subcostal window, suboptimal apical window and patient is morbidly obese. Image acquisition challenging due to patient body habitus. IMPRESSIONS  1. Left ventricular ejection fraction, by visual estimation, is 55 to 60%. The left ventricle has normal function. There is mildly increased left ventricular hypertrophy. Normal diastolic function.  2. The left ventricle has no regional wall motion abnormalities.  3. Global right ventricle has normal systolic function.The right ventricular size is normal. No increase in right ventricular wall thickness.  4. Left atrial size was normal.  5. Right atrial size was normal.  6. Trivial pericardial effusion is present.  7. The mitral valve is normal in structure. No evidence of mitral valve regurgitation. No evidence of mitral  stenosis.  8. The tricuspid valve is normal in structure.  9. The aortic valve is  normal in structure. Aortic valve regurgitation is not visualized. No evidence of aortic valve sclerosis or stenosis. 10. The tricuspid regurgitant velocity is 2.54 m/s, and with an assumed right atrial pressure of 3 mmHg, the estimated right ventricular systolic pressure is normal at 28.8 mmHg. 11. The inferior vena cava is normal in size with greater than 50% respiratory variability, suggesting right atrial pressure of 3 mmHg. FINDINGS  Left Ventricle: Left ventricular ejection fraction, by visual estimation, is 55 to 60%. The left ventricle has normal function. The left ventricle has no regional wall motion abnormalities. The left ventricular internal cavity size was the left ventricle is normal in size. There is mildly increased left ventricular hypertrophy. Left ventricular diastolic parameters were normal. Right Ventricle: The right ventricular size is normal. No increase in right ventricular wall thickness. Global RV systolic function is has normal systolic function. The tricuspid regurgitant velocity is 2.54 m/s, and with an assumed right atrial pressure  of 3 mmHg, the estimated right ventricular systolic pressure is normal at 28.8 mmHg. Left Atrium: Left atrial size was normal in size. Right Atrium: Right atrial size was normal in size Pericardium: Trivial pericardial effusion is present. Mitral Valve: The mitral valve is normal in structure. No evidence of mitral valve regurgitation. No evidence of mitral valve stenosis by observation. Tricuspid Valve: The tricuspid valve is normal in structure. Tricuspid valve regurgitation is trivial. Aortic Valve: The aortic valve is normal in structure. Aortic valve regurgitation is not visualized. The aortic valve is structurally normal, with no evidence of sclerosis or stenosis. Pulmonic Valve: The pulmonic valve was normal in structure. Pulmonic valve regurgitation is trivial.  Pulmonic regurgitation is trivial. Aorta: The aortic root is normal in size and structure. Venous: The inferior vena cava is normal in size with greater than 50% respiratory variability, suggesting right atrial pressure of 3 mmHg. IAS/Shunts: No atrial level shunt detected by color flow Doppler.  LEFT VENTRICLE PLAX 2D LVIDd:         4.30 cm       Diastology LVIDs:         3.00 cm       LV e' lateral:   12.70 cm/s LV PW:         1.20 cm       LV E/e' lateral: 5.6 LV IVS:        1.20 cm       LV e' medial:    8.27 cm/s LVOT diam:     2.00 cm       LV E/e' medial:  8.6 LV SV:         48 ml LV SV Index:   22.91 LVOT Area:     3.14 cm  LV Volumes (MOD) LV area d, A2C:    24.30 cm LV area d, A4C:    24.70 cm LV area s, A2C:    15.20 cm LV area s, A4C:    13.50 cm LV major d, A2C:   7.18 cm LV major d, A4C:   6.89 cm LV major s, A2C:   6.13 cm LV major s, A4C:   6.35 cm LV vol d, MOD A2C: 70.0 ml LV vol d, MOD A4C: 71.7 ml LV vol s, MOD A2C: 32.4 ml LV vol s, MOD A4C: 24.2 ml LV SV MOD A2C:     37.6 ml LV SV MOD A4C:     71.7 ml LV SV MOD BP:      43.4 ml RIGHT VENTRICLE  IVC RV S prime:     8.92 cm/s  IVC diam: 1.50 cm TAPSE (M-mode): 1.4 cm LEFT ATRIUM             Index       RIGHT ATRIUM           Index LA diam:        2.80 cm 1.39 cm/m  RA Area:     12.30 cm LA Vol (A2C):   23.6 ml 11.75 ml/m RA Volume:   28.20 ml  14.04 ml/m LA Vol (A4C):   21.9 ml 10.90 ml/m LA Biplane Vol: 23.8 ml 11.85 ml/m  AORTIC VALVE             PULMONIC VALVE LVOT Vmax:   83.50 cm/s  PR End Diast Vel: 1.67 msec LVOT Vmean:  54.900 cm/s LVOT VTI:    0.167 m  AORTA Ao Root diam: 3.00 cm Ao Asc diam:  3.10 cm MITRAL VALVE                        TRICUSPID VALVE MV Area (PHT): 3.72 cm             TR Peak grad:   25.8 mmHg MV PHT:        59.16 msec           TR Vmax:        254.00 cm/s MV Decel Time: 204 msec MV E velocity: 71.30 cm/s 103 cm/s  SHUNTS MV A velocity: 67.90 cm/s 70.3 cm/s Systemic VTI:  0.17 m MV E/A ratio:   1.05       1.5       Systemic Diam: 2.00 cm  Marca Ancona MD Electronically signed by Marca Ancona MD Signature Date/Time: 10/13/2019/4:35:42 PM    Final         Scheduled Meds: . fesoterodine  4 mg Oral Daily  . gabapentin  300 mg Oral TID  . mometasone-formoterol  2 puff Inhalation BID  . sodium chloride flush  3 mL Intravenous Q12H  . topiramate  100 mg Oral QHS  . [START ON 10/19/2019] Vitamin D (Ergocalciferol)  50,000 Units Oral Weekly   Continuous Infusions: . heparin 1,250 Units/hr (10/14/19 0536)  . remdesivir 100 mg in NS 100 mL 100 mg (10/14/19 0824)     LOS: 1 day    Time spent: 16    Aquilla Hacker, MD [facility] Pager [phone]   If 7PM-7AM, please contact night-coverage [email or web address] Password [facility] 10/14/2019, 4:54 PM

## 2019-10-14 NOTE — Progress Notes (Signed)
ANTICOAGULATION CONSULT NOTE - Follow Up Consult  Pharmacy Consult for Heparin Indication: pulmonary embolus  Allergies  Allergen Reactions  . Shellfish-Derived Products Anaphylaxis  . Ivp Dye [Iodinated Diagnostic Agents] Hives and Itching  . Latex Hives and Itching  . Penicillins Hives and Swelling    Has patient had a PCN reaction causing immediate rash, facial/tongue/throat swelling, SOB or lightheadedness with hypotension: Yes  Has patient had a PCN reaction causing severe rash involving mucus membranes or skin necrosis: Yes Has patient had a PCN reaction that required hospitalization: Yes Has patient had a PCN reaction occurring within the last 10 years: no If all of the above answers are "NO", then may proceed with Cephalosporin use.     Patient Measurements: Height: 5\' 6"  (167.6 cm) Weight: 202 lb (91.6 kg) IBW/kg (Calculated) : 59.3 Heparin Dosing Weight: 79.4 kg  Vital Signs: Temp: 98.6 F (37 C) (01/14 1339) Temp Source: Oral (01/14 1339) BP: 108/69 (01/14 1340) Pulse Rate: 97 (01/14 1340)  Labs: Recent Labs     0000 10/12/19 0817 10/12/19 0855 10/12/19 1039 10/13/19 0036 10/13/19 0305 10/13/19 0903 10/13/19 1439 10/13/19 1953 10/14/19 0312 10/14/19 1138 10/14/19 1806  HGB   < >  --  12.6  --   --   --   --  10.6*  --  10.2*  --   --   HCT  --   --  39.0  --   --   --   --  33.4*  --  32.2*  --   --   PLT  --   --  223  --   --   --   --  233  --  260  --   --   HEPARINUNFRC  --   --   --   --  0.94*  --    < > 0.22*   < > 0.26* 0.41 0.40  CREATININE  --   --  1.03*  --   --   --   --  0.80  --  0.85  --   --   TROPONINIHS  --    < >  --  <2 <2.00 <2.00  --   --   --   --   --   --    < > = values in this interval not displayed.    Estimated Creatinine Clearance: 95.3 mL/min (by C-G formula based on SCr of 0.85 mg/dL).  Assessment: 44 year old female Covid + found to have PE  10/14/2019:  1806 confirmatory heparin level = 0.40 units/mL,  remains therapeutic on 1250 units/hr  CBC: Hgb 10.2, Pltc WNL  No bleeding or infusion related issues reported by RN  Goal of Therapy:  Heparin level 0.3-0.7 units/ml Monitor platelets by anticoagulation protocol: Yes   Plan:  Continue heparin infusion at 1250 units/hr Daily heparin level, CBC while on heparin F/U plans for long-term anticoagulation  1807, Pharm.D [phone] 10/14/2019 7:16 PM

## 2019-10-14 NOTE — Progress Notes (Signed)
ANTICOAGULATION CONSULT NOTE - Follow Up Consult  Pharmacy Consult for Heparin Indication: pulmonary embolus  Allergies  Allergen Reactions  . Shellfish-Derived Products Anaphylaxis  . Ivp Dye [Iodinated Diagnostic Agents] Hives and Itching  . Latex Hives and Itching  . Penicillins Hives and Swelling    Has patient had a PCN reaction causing immediate rash, facial/tongue/throat swelling, SOB or lightheadedness with hypotension: Yes  Has patient had a PCN reaction causing severe rash involving mucus membranes or skin necrosis: Yes Has patient had a PCN reaction that required hospitalization: Yes Has patient had a PCN reaction occurring within the last 10 years: no If all of the above answers are "NO", then may proceed with Cephalosporin use.     Patient Measurements: Height: 5\' 6"  (167.6 cm) Weight: 202 lb (91.6 kg) IBW/kg (Calculated) : 59.3 Heparin Dosing Weight:   Vital Signs: Temp: 98.8 F (37.1 C) (01/13 2004) Temp Source: Oral (01/13 2004) BP: 105/77 (01/14 0503) Pulse Rate: 100 (01/14 0503)  Labs: Recent Labs     0000 10/12/19 0817 10/12/19 0855 10/12/19 1039 10/13/19 0036 10/13/19 0305 10/13/19 0903 10/13/19 1439 10/13/19 1953 10/14/19 0312  HGB   < >  --  12.6  --   --   --   --  10.6*  --  10.2*  HCT  --   --  39.0  --   --   --   --  33.4*  --  32.2*  PLT  --   --  223  --   --   --   --  233  --  260  HEPARINUNFRC  --   --   --   --  0.94*  --    < > 0.22* 0.32 0.26*  CREATININE  --   --  1.03*  --   --   --   --  0.80  --  0.85  TROPONINIHS  --    < >  --  <2 <2.00 <2.00  --   --   --   --    < > = values in this interval not displayed.    Estimated Creatinine Clearance: 95.3 mL/min (by C-G formula based on SCr of 0.85 mg/dL).   Medications:  Infusions:  . heparin 1,050 Units/hr (10/13/19 2115)  . remdesivir 100 mg in NS 100 mL Stopped (10/13/19 1013)    Assessment: Patient with low heparin level.  No heparin issues per RN.  Goal of  Therapy:  Heparin level 0.3-0.7 units/ml Monitor platelets by anticoagulation protocol: Yes   Plan:  Increase heparin to 1250 units/hr Recheck level at [address] [place] 10/14/2019,5:31 AM

## 2019-10-15 LAB — COMPREHENSIVE METABOLIC PANEL
ALT: 19 U/L (ref 0–44)
AST: 15 U/L (ref 15–41)
Albumin: 3 g/dL — ABNORMAL LOW (ref 3.5–5.0)
Alkaline Phosphatase: 52 U/L (ref 38–126)
Anion gap: 11 (ref 5–15)
BUN: 12 mg/dL (ref 6–20)
CO2: 22 mmol/L (ref 22–32)
Calcium: 9 mg/dL (ref 8.9–10.3)
Chloride: 107 mmol/L (ref 98–111)
Creatinine, Ser: 0.9 mg/dL (ref 0.44–1.00)
GFR calc Af Amer: >60 mL/min (ref >60)
GFR calc non Af Amer: >60 mL/min (ref >60)
Glucose, Bld: 105 mg/dL — ABNORMAL HIGH (ref 70–99)
Potassium: 3.6 mmol/L (ref 3.5–5.1)
Sodium: 140 mmol/L (ref 135–145)
Total Bilirubin: 0.4 mg/dL (ref 0.3–1.2)
Total Protein: 6.6 g/dL (ref 6.5–8.1)

## 2019-10-15 LAB — CBC WITH DIFFERENTIAL/PLATELET
Abs Immature Granulocytes: 0.02 10*3/uL (ref 0.00–0.07)
Basophils Absolute: 0 10*3/uL (ref 0.0–0.1)
Basophils Relative: 1 %
Eosinophils Absolute: 0.1 10*3/uL (ref 0.0–0.5)
Eosinophils Relative: 2 %
HCT: 33.5 % — ABNORMAL LOW (ref 36.0–46.0)
Hemoglobin: 10.3 g/dL — ABNORMAL LOW (ref 12.0–15.0)
Immature Granulocytes: 0 %
Lymphocytes Relative: 51 %
Lymphs Abs: 2.7 10*3/uL (ref 0.7–4.0)
MCH: 30.3 pg (ref 26.0–34.0)
MCHC: 30.7 g/dL (ref 30.0–36.0)
MCV: 98.5 fL (ref 80.0–100.0)
Monocytes Absolute: 0.6 10*3/uL (ref 0.1–1.0)
Monocytes Relative: 12 %
Neutro Abs: 1.7 10*3/uL (ref 1.7–7.7)
Neutrophils Relative %: 34 %
Platelets: 275 10*3/uL (ref 150–400)
RBC: 3.4 MIL/uL — ABNORMAL LOW (ref 3.87–5.11)
RDW: 11.3 % — ABNORMAL LOW (ref 11.5–15.5)
WBC: 5.2 10*3/uL (ref 4.0–10.5)
nRBC: 0 % (ref 0.0–0.2)

## 2019-10-15 LAB — PHOSPHORUS: Phosphorus: 3.8 mg/dL (ref 2.5–4.6)

## 2019-10-15 LAB — D-DIMER, QUANTITATIVE: D-Dimer, Quant: 5.73 ug/mL-FEU — ABNORMAL HIGH (ref 0.00–0.50)

## 2019-10-15 LAB — HEPARIN LEVEL (UNFRACTIONATED): Heparin Unfractionated: 0.34 IU/mL (ref 0.30–0.70)

## 2019-10-15 LAB — C-REACTIVE PROTEIN: CRP: 10.6 mg/dL — ABNORMAL HIGH (ref <1.0)

## 2019-10-15 LAB — MAGNESIUM: Magnesium: 1.8 mg/dL (ref 1.7–2.4)

## 2019-10-15 LAB — FERRITIN: Ferritin: 295 ng/mL (ref 11–307)

## 2019-10-15 MED ORDER — LACTULOSE 10 GM/15ML PO SOLN: 30.0000 g | Freq: Every day | ORAL | Status: DC | PRN

## 2019-10-15 MED ORDER — BISACODYL 5 MG PO TBEC
10.0000 mg | DELAYED_RELEASE_TABLET | Freq: Once | ORAL | Status: AC
  Administered 2019-10-15: 10 mg via ORAL
  Filled 2019-10-15: qty 2

## 2019-10-15 MED ORDER — SENNOSIDES-DOCUSATE SODIUM 8.6-50 MG PO TABS
1.0000 | ORAL_TABLET | Freq: Two times a day (BID) | ORAL | Status: DC
  Administered 2019-10-15 – 2019-10-16 (×3): 1 via ORAL
  Filled 2019-10-15 (×3): qty 1

## 2019-10-15 NOTE — Progress Notes (Signed)
PROGRESS NOTE    Veronica Odom  WEX:[id number] DOB: 1975/10/06 DOA: 10/12/2019 PCP: Patria Mane, MD   Brief Narrative:  Veronica Odom is a 44 y.o. female with medical history significant of COVID Positive (10/05/2019), allergy, HTN, vitamin D deficiency, chronic headache, chronic pain, OSA, GERD, HLD   Presented with chest pain and worsening shortness of breath Since her diagnosis and if if she have had a loss of smell but otherwise have not had much of any symptoms up until now when she got suddenly more short of breath.  Cannot take a deep breath.  Had a chest pain worse yesterday.  Still no fever no cough no nausea no vomiting no diarrhea she has never had any prior symptoms. She has history of asthma but usually well controlled and this does not feel like a regular asthma She also reported some right calf tenderness  no TB exposure, no mold exposure Works at [employer]  Assessment & Plan:   Active Problems:   Acute pulmonary embolism (HCC)   COVID-19 virus infection   Pneumonia due to COVID-19 virus   Pulmonary nodules  Acute pulmonary embolism as noted on CT angiogram of the chest Continue with heparin drip Continue with incentive spirometry Bronchodilators Patient is saturating above 90% on room air. Will consider discharging home on oral anticoagulation.   Pneumonia due to COVID-19 virus infection Continue with dexamethasone and remdesivir Patient is saturating above 90% on room air.  Pulmonary nodules Follow-up with pulmonology as outpatient      DVT prophylaxis: Heparin drip  (Code Status: Full code  Family Communication: None Disposition Plan: Patient is from home and lives with her daughter. She will be discharged to home and no barriers to discharge tomorrow 10/16/2019  Consultants:   None  Procedures: None   Antimicrobials: None  Subjective: Patient was seen and examined at bedside.  Saturating at 90% on room air  No new  complaints. We will continue with heparin drip for pulmonary embolism. Encourage patient to use incentive spirometry  Objective: Vitals:   10/14/19 1339 10/14/19 1340 10/14/19 2112 10/15/19 0507  BP:  108/69 125/80 118/78  Pulse:  97 87 81  Resp:   18 18  Temp: 98.6 F (37 C)  98.1 F (36.7 C) 98.4 F (36.9 C)  TempSrc: Oral     SpO2:  97% 98% 97%  Weight:      Height:        Intake/Output Summary (Last 24 hours) at 10/15/2019 0748 Last data filed at 10/15/2019 0400 Gross per 24 hour  Intake 362.5 ml  Output --  Net 362.5 ml   Filed Weights   10/12/19 0801 10/12/19 2243  Weight: 92.5 kg 91.6 kg    Examination:  General exam: Appears calm and comfortable  Respiratory system: Clear to auscultation. Respiratory effort normal. Cardiovascular system: S1 & S2 heard, RRR. No JVD, murmurs, rubs, gallops or clicks. No pedal edema. Gastrointestinal system: Abdomen is nondistended, soft and nontender. No organomegaly or masses felt. Normal bowel sounds heard. Central nervous system: Alert and oriented. No focal neurological deficits. Extremities: Symmetric 5 x 5 power. Skin: No rashes, lesions or ulcers Psychiatry: Judgement and insight appear normal. Mood & affect appropriate.     Data Reviewed: I have personally reviewed following labs and imaging studies  CBC: Recent Labs  Lab 10/12/19 0855 10/13/19 1439 10/14/19 0312 10/15/19 0313  WBC 7.5 7.2 7.4 5.2  NEUTROABS 4.6 3.8 3.4 1.7  HGB 12.6 10.6* 10.2* 10.3*  HCT 39.0  33.4* 32.2* 33.5*  MCV 94.9 97.1 97.9 98.5  PLT 223 233 260 275   Basic Metabolic Panel: Recent Labs  Lab 10/12/19 0855 10/13/19 1439 10/14/19 0312 10/15/19 0313  NA 138 143 140 140  K 3.2* 3.9 3.7 3.6  CL 101 110 108 107  CO2 23 24 23 22   GLUCOSE 159* 123* 86 105*  BUN 10 8 9 12   CREATININE 1.03* 0.80 0.85 0.90  CALCIUM 9.5 8.8* 8.9 9.0  MG  --  1.7 1.7 1.8  PHOS  --  3.0 3.3 3.8   GFR: Estimated Creatinine Clearance: 90 mL/min (by  C-G formula based on SCr of 0.9 mg/dL). Liver Function Tests: Recent Labs  Lab 10/12/19 0855 10/13/19 1439 10/14/19 0312 10/15/19 0313  AST 25 19 16 15   ALT 27 23 23 19   ALKPHOS 66 59 57 52  BILITOT 0.6 0.5 0.4 0.4  PROT 8.5* 7.3 7.4 6.6  ALBUMIN 4.2 3.4* 3.5 3.0*   Recent Labs  Lab 10/12/19 0855  LIPASE 45   No results for input(s): AMMONIA in the last 168 hours. Coagulation Profile: No results for input(s): INR, PROTIME in the last 168 hours. Cardiac Enzymes: No results for input(s): CKTOTAL, CKMB, CKMBINDEX, TROPONINI in the last 168 hours. BNP (last 3 results) No results for input(s): PROBNP in the last 8760 hours. HbA1C: No results for input(s): HGBA1C in the last 72 hours. CBG: No results for input(s): GLUCAP in the last 168 hours. Lipid Profile: Recent Labs    10/12/19 1606  TRIG 135   Thyroid Function Tests: No results for input(s): TSH, T4TOTAL, FREET4, T3FREE, THYROIDAB in the last 72 hours. Anemia Panel: Recent Labs    10/14/19 0312 10/15/19 0313  FERRITIN 297 295   Sepsis Labs: Recent Labs  Lab 10/12/19 1606 10/12/19 1815  PROCALCITON <0.10  --   LATICACIDVEN 2.4* 1.5    Recent Results (from the past 240 hour(s))  Blood Culture (routine x 2)     Status: None (Preliminary result)   Collection Time: 10/12/19  3:45 PM   Specimen: BLOOD  Result Value Ref Range Status   Specimen Description   Final    BLOOD LEFT ANTECUBITAL Performed at [facility] [place], [address] [place], [place] [address]    Special Requests   Final    BOTTLES DRAWN AEROBIC ONLY Blood Culture adequate volume Performed at [facility] [place], [address] [place], [place] [address]    Culture   Final    NO GROWTH 2 DAYS Performed at [facility], [address] [place], [place] [address]    Report Status PENDING  Incomplete  Blood Culture (routine x 2)     Status: None (Preliminary result)   Collection Time: 10/12/19  4:06 PM   Specimen:  BLOOD  Result Value Ref Range Status   Specimen Description   Final    BLOOD RIGHT ANTECUBITAL Performed at [facility] [place], [address] [place], [place] [address]    Special Requests   Final    BOTTLES DRAWN AEROBIC AND ANAEROBIC Blood Culture adequate volume Performed at [facility] [place], [address] [place], [place] [address]    Culture   Final    NO GROWTH 2 DAYS Performed at [facility], [address] [place], [place] [address]    Report Status PENDING  Incomplete         Radiology Studies: ECHOCARDIOGRAM COMPLETE  Result  Date: 10/13/2019   ECHOCARDIOGRAM REPORT   Patient Name:   SEMA STANGLER Date of Exam: 10/13/2019 Medical Rec #:  [id number]    Height:       66.0 in Accession #:    [id number]   Weight:       202.0 lb Date of Birth:  1976-04-17    BSA:          2.01 m Patient Age:    43 years     BP:           123/83 mmHg Patient Gender: F            HR:           79 bpm. Exam Location:  Inpatient Procedure: 2D Echo, Color Doppler and Cardiac Doppler Indications:    I26.02 Pulmonary embolus  History:        Patient has prior history of Echocardiogram examinations, most                 recent 12/30/2017. Signs/Symptoms:Dyspnea and Chest Pain; Risk                 Factors:Hypertension and Dyslipidemia. Covid 19 positive.                 Pneumonia.  Sonographer:    Sheralyn Boatman RDCS Referring Phys: [id number] ANASTASSIA DOUTOVA  Sonographer Comments: Technically difficult study due to poor echo windows, suboptimal subcostal window, suboptimal apical window and patient is morbidly obese. Image acquisition challenging due to patient body habitus. IMPRESSIONS  1. Left ventricular ejection fraction, by visual estimation, is 55 to 60%. The left ventricle has normal function. There is mildly increased left ventricular hypertrophy. Normal diastolic function.  2. The left ventricle has no regional wall motion abnormalities.  3. Global right ventricle has normal systolic  function.The right ventricular size is normal. No increase in right ventricular wall thickness.  4. Left atrial size was normal.  5. Right atrial size was normal.  6. Trivial pericardial effusion is present.  7. The mitral valve is normal in structure. No evidence of mitral valve regurgitation. No evidence of mitral stenosis.  8. The tricuspid valve is normal in structure.  9. The aortic valve is normal in structure. Aortic valve regurgitation is not visualized. No evidence of aortic valve sclerosis or stenosis. 10. The tricuspid regurgitant velocity is 2.54 m/s, and with an assumed right atrial pressure of 3 mmHg, the estimated right ventricular systolic pressure is normal at 28.8 mmHg. 11. The inferior vena cava is normal in size with greater than 50% respiratory variability, suggesting right atrial pressure of 3 mmHg. FINDINGS  Left Ventricle: Left ventricular ejection fraction, by visual estimation, is 55 to 60%. The left ventricle has normal function. The left ventricle has no regional wall motion abnormalities. The left ventricular internal cavity size was the left ventricle is normal in size. There is mildly increased left ventricular hypertrophy. Left ventricular diastolic parameters were normal. Right Ventricle: The right ventricular size is normal. No increase in right ventricular wall thickness. Global RV systolic function is has normal systolic function. The tricuspid regurgitant velocity is 2.54 m/s, and with an assumed right atrial pressure  of 3 mmHg, the estimated right ventricular systolic pressure is normal at 28.8 mmHg. Left Atrium: Left atrial size was normal in size. Right Atrium: Right atrial size was normal in size Pericardium: Trivial pericardial effusion is present. Mitral Valve: The mitral valve is normal in structure. No evidence of mitral valve regurgitation.  No evidence of mitral valve stenosis by observation. Tricuspid Valve: The tricuspid valve is normal in structure. Tricuspid valve  regurgitation is trivial. Aortic Valve: The aortic valve is normal in structure. Aortic valve regurgitation is not visualized. The aortic valve is structurally normal, with no evidence of sclerosis or stenosis. Pulmonic Valve: The pulmonic valve was normal in structure. Pulmonic valve regurgitation is trivial. Pulmonic regurgitation is trivial. Aorta: The aortic root is normal in size and structure. Venous: The inferior vena cava is normal in size with greater than 50% respiratory variability, suggesting right atrial pressure of 3 mmHg. IAS/Shunts: No atrial level shunt detected by color flow Doppler.  LEFT VENTRICLE PLAX 2D LVIDd:         4.30 cm       Diastology LVIDs:         3.00 cm       LV e' lateral:   12.70 cm/s LV PW:         1.20 cm       LV E/e' lateral: 5.6 LV IVS:        1.20 cm       LV e' medial:    8.27 cm/s LVOT diam:     2.00 cm       LV E/e' medial:  8.6 LV SV:         48 ml LV SV Index:   22.91 LVOT Area:     3.14 cm  LV Volumes (MOD) LV area d, A2C:    24.30 cm LV area d, A4C:    24.70 cm LV area s, A2C:    15.20 cm LV area s, A4C:    13.50 cm LV major d, A2C:   7.18 cm LV major d, A4C:   6.89 cm LV major s, A2C:   6.13 cm LV major s, A4C:   6.35 cm LV vol d, MOD A2C: 70.0 ml LV vol d, MOD A4C: 71.7 ml LV vol s, MOD A2C: 32.4 ml LV vol s, MOD A4C: 24.2 ml LV SV MOD A2C:     37.6 ml LV SV MOD A4C:     71.7 ml LV SV MOD BP:      43.4 ml RIGHT VENTRICLE            IVC RV S prime:     8.92 cm/s  IVC diam: 1.50 cm TAPSE (M-mode): 1.4 cm LEFT ATRIUM             Index       RIGHT ATRIUM           Index LA diam:        2.80 cm 1.39 cm/m  RA Area:     12.30 cm LA Vol (A2C):   23.6 ml 11.75 ml/m RA Volume:   28.20 ml  14.04 ml/m LA Vol (A4C):   21.9 ml 10.90 ml/m LA Biplane Vol: 23.8 ml 11.85 ml/m  AORTIC VALVE             PULMONIC VALVE LVOT Vmax:   83.50 cm/s  PR End Diast Vel: 1.67 msec LVOT Vmean:  54.900 cm/s LVOT VTI:    0.167 m  AORTA Ao Root diam: 3.00 cm Ao Asc diam:  3.10 cm MITRAL  VALVE                        TRICUSPID VALVE MV Area (PHT): 3.72 cm  TR Peak grad:   25.8 mmHg MV PHT:        59.16 msec           TR Vmax:        254.00 cm/s MV Decel Time: 204 msec MV E velocity: 71.30 cm/s 103 cm/s  SHUNTS MV A velocity: 67.90 cm/s 70.3 cm/s Systemic VTI:  0.17 m MV E/A ratio:  1.05       1.5       Systemic Diam: 2.00 cm  Marca Ancona MD Electronically signed by Marca Ancona MD Signature Date/Time: 10/13/2019/4:35:42 PM    Final         Scheduled Meds: . fesoterodine  4 mg Oral Daily  . gabapentin  300 mg Oral TID  . mometasone-formoterol  2 puff Inhalation BID  . sodium chloride flush  3 mL Intravenous Q12H  . topiramate  100 mg Oral QHS  . [START ON 10/19/2019] Vitamin D (Ergocalciferol)  50,000 Units Oral Weekly   Continuous Infusions: . heparin 1,250 Units/hr (10/15/19 0538)  . remdesivir 100 mg in NS 100 mL Stopped (10/14/19 0900)     LOS: 2 days    Time spent: 61    Aquilla Hacker, MD [facility] Pager [phone]   If 7PM-7AM, please contact night-coverage [email or web address] Password [facility] 10/15/2019, 7:48 AM

## 2019-10-15 NOTE — Progress Notes (Signed)
ANTICOAGULATION CONSULT NOTE - Follow Up Consult  Pharmacy Consult for Heparin Indication: pulmonary embolus  Allergies  Allergen Reactions  . Shellfish-Derived Products Anaphylaxis  . Ivp Dye [Iodinated Diagnostic Agents] Hives and Itching  . Latex Hives and Itching  . Penicillins Hives and Swelling    Has patient had a PCN reaction causing immediate rash, facial/tongue/throat swelling, SOB or lightheadedness with hypotension: Yes  Has patient had a PCN reaction causing severe rash involving mucus membranes or skin necrosis: Yes Has patient had a PCN reaction that required hospitalization: Yes Has patient had a PCN reaction occurring within the last 10 years: no If all of the above answers are "NO", then may proceed with Cephalosporin use.     Patient Measurements: Height: 5\' 6"  (167.6 cm) Weight: 202 lb (91.6 kg) IBW/kg (Calculated) : 59.3 Heparin Dosing Weight: 79.4 kg  Vital Signs: Temp: 98.4 F (36.9 C) (01/15 0507) BP: 118/78 (01/15 0507) Pulse Rate: 81 (01/15 0507)  Labs: Recent Labs     0000 10/12/19 1039 10/13/19 0036 10/13/19 0305 10/13/19 0903 10/13/19 1439 10/13/19 1953 10/14/19 0312 10/14/19 0312 10/14/19 1138 10/14/19 1806 10/15/19 0313  HGB   < >  --   --   --   --  10.6*  --  10.2*  --   --   --  10.3*  HCT   < >  --   --   --   --  33.4*  --  32.2*  --   --   --  33.5*  PLT   < >  --   --   --   --  233  --  260  --   --   --  275  HEPARINUNFRC  --   --  0.94*  --    < > 0.22*   < > 0.26*   < > 0.41 0.40 0.34  CREATININE   < >  --   --   --   --  0.80  --  0.85  --   --   --  0.90  TROPONINIHS  --  <2 <2.00 <2.00  --   --   --   --   --   --   --   --    < > = values in this interval not displayed.    Estimated Creatinine Clearance: 90 mL/min (by C-G formula based on SCr of 0.9 mg/dL).  Assessment: 44 year old female Covid + found to have PE  10/15/2019:  heparin level = 0.34 units/mL, now therapeutic  CBC: Hgb 10.2-low but stable, Pltc  WNL  No bleeding or infusion related issues reported by RN  Goal of Therapy:  Heparin level 0.3-0.7 units/ml Monitor platelets by anticoagulation protocol: Yes   Plan:  Continue heparin infusion at 1250 units/hr Daily heparin level, CBC while on heparin F/U plans for long-term anticoagulation  10/17/2019, PharmD, BCPS 10/15/2019 7:32 AM

## 2019-10-15 NOTE — Progress Notes (Signed)
Pt stated she did not had BM since admission, + flatus, bowel sounds are active in all quadrants. MD is paged and received new orders. Will monitor.

## 2019-10-16 LAB — CBC WITH DIFFERENTIAL/PLATELET
Abs Immature Granulocytes: 0.02 10*3/uL (ref 0.00–0.07)
Basophils Absolute: 0 10*3/uL (ref 0.0–0.1)
Basophils Relative: 1 %
Eosinophils Absolute: 0.1 10*3/uL (ref 0.0–0.5)
Eosinophils Relative: 2 %
HCT: 37.3 % (ref 36.0–46.0)
Hemoglobin: 11.5 g/dL — ABNORMAL LOW (ref 12.0–15.0)
Immature Granulocytes: 0 %
Lymphocytes Relative: 39 %
Lymphs Abs: 2.4 10*3/uL (ref 0.7–4.0)
MCH: 30.3 pg (ref 26.0–34.0)
MCHC: 30.8 g/dL (ref 30.0–36.0)
MCV: 98.2 fL (ref 80.0–100.0)
Monocytes Absolute: 0.4 10*3/uL (ref 0.1–1.0)
Monocytes Relative: 7 %
Neutro Abs: 3.1 10*3/uL (ref 1.7–7.7)
Neutrophils Relative %: 51 %
Platelets: 305 10*3/uL (ref 150–400)
RBC: 3.8 MIL/uL — ABNORMAL LOW (ref 3.87–5.11)
RDW: 11.2 % — ABNORMAL LOW (ref 11.5–15.5)
WBC: 6 10*3/uL (ref 4.0–10.5)
nRBC: 0 % (ref 0.0–0.2)

## 2019-10-16 LAB — COMPREHENSIVE METABOLIC PANEL
ALT: 32 U/L (ref 0–44)
AST: 39 U/L (ref 15–41)
Albumin: 3.6 g/dL (ref 3.5–5.0)
Alkaline Phosphatase: 65 U/L (ref 38–126)
Anion gap: 12 (ref 5–15)
BUN: 12 mg/dL (ref 6–20)
CO2: 21 mmol/L — ABNORMAL LOW (ref 22–32)
Calcium: 9.3 mg/dL (ref 8.9–10.3)
Chloride: 104 mmol/L (ref 98–111)
Creatinine, Ser: 0.88 mg/dL (ref 0.44–1.00)
GFR calc Af Amer: >60 mL/min (ref >60)
GFR calc non Af Amer: >60 mL/min (ref >60)
Glucose, Bld: 110 mg/dL — ABNORMAL HIGH (ref 70–99)
Potassium: 3.8 mmol/L (ref 3.5–5.1)
Sodium: 137 mmol/L (ref 135–145)
Total Bilirubin: 0.3 mg/dL (ref 0.3–1.2)
Total Protein: 7.8 g/dL (ref 6.5–8.1)

## 2019-10-16 LAB — MAGNESIUM: Magnesium: 2 mg/dL (ref 1.7–2.4)

## 2019-10-16 LAB — D-DIMER, QUANTITATIVE: D-Dimer, Quant: 5.96 ug/mL-FEU — ABNORMAL HIGH (ref 0.00–0.50)

## 2019-10-16 LAB — C-REACTIVE PROTEIN: CRP: 7.1 mg/dL — ABNORMAL HIGH (ref <1.0)

## 2019-10-16 LAB — FERRITIN: Ferritin: 380 ng/mL — ABNORMAL HIGH (ref 11–307)

## 2019-10-16 LAB — PHOSPHORUS: Phosphorus: 3.1 mg/dL (ref 2.5–4.6)

## 2019-10-16 LAB — HEPARIN LEVEL (UNFRACTIONATED): Heparin Unfractionated: 0.34 IU/mL (ref 0.30–0.70)

## 2019-10-16 MED ORDER — APIXABAN 5 MG PO TABS: 5.0000 mg | ORAL_TABLET | Freq: Two times a day (BID) | ORAL | 3 refills | Status: DC

## 2019-10-16 MED ORDER — SENNOSIDES-DOCUSATE SODIUM 8.6-50 MG PO TABS: 1.0000 | ORAL_TABLET | Freq: Two times a day (BID) | ORAL | 0 refills | Status: AC

## 2019-10-16 MED ORDER — APIXABAN 5 MG PO TABS: 10.0000 mg | ORAL_TABLET | Freq: Two times a day (BID) | ORAL | 0 refills | Status: DC

## 2019-10-16 MED ORDER — APIXABAN 5 MG PO TABS
10.0000 mg | ORAL_TABLET | Freq: Two times a day (BID) | ORAL | Status: DC
  Administered 2019-10-16: 10 mg via ORAL
  Filled 2019-10-16: qty 2

## 2019-10-16 MED ORDER — GUAIFENESIN-DM 100-10 MG/5ML PO SYRP: 10.0000 mL | ORAL_SOLUTION | ORAL | 0 refills | Status: AC | PRN

## 2019-10-16 MED ORDER — APIXABAN 5 MG PO TABS: 5.0000 mg | ORAL_TABLET | Freq: Two times a day (BID) | ORAL | Status: DC

## 2019-10-16 MED ORDER — HYDROCODONE-ACETAMINOPHEN 5-325 MG PO TABS: 1.0000 | ORAL_TABLET | ORAL | 0 refills | Status: AC | PRN

## 2019-10-16 NOTE — Discharge Summary (Signed)
Physician Discharge Summary  Patient ID: Veronica Odom MRN: [id number] DOB/AGE: 1976/07/06 44 y.o.  Admit date: 10/12/2019 Discharge date: 10/16/2019  Admission Diagnoses: Acute pulmonary embolism  Discharge Diagnoses:  Active Problems:   Acute pulmonary embolism (HCC)   COVID-19 virus infection   Pneumonia due to COVID-19 virus   Pulmonary nodules   Discharged Condition: Stable   Hospital Course:  Veronica Odom is a 44 y.o. female with medical history significant of COVID Positive (10/05/2019), allergy, HTN, vitamin D deficiency, chronic headache, chronic pain, OSA, GERD, HLD  Presented with chest pain and worsening shortness of breath Since her diagnosis and if if she have had a loss of smell but otherwise have not had much of any symptoms up until now when she got suddenly more short of breath.  Cannot take a deep breath.  Had a chest pain worse yesterday.  Still no fever no cough no nausea no vomiting no diarrhea she has never had any prior symptoms. She has history of asthma but usually well controlled and this does not feel like a regular asthma   Plan Active Problems:   Acute pulmonary embolism (HCC)   COVID-19 virus infection   Pneumonia due to COVID-19 virus   Pulmonary nodules  Acute pulmonary embolism as noted on CT angiogram of the chest Heparin transition to p.o. apixaban for 3 months. Bronchodilators   Pneumonia due to COVID-19 virus infection Completed 5-day course of remdesivir. Discharged with dexamethasone 6 mg daily for 5 days.  Pulmonary nodules Follow-up with pulmonology as outpatient  Consults: None  Significant Diagnostic Studies: Blood culture-no growth to date Echocardiogram 2D-ejection fraction 55 to 60% with normal left ventricular systolic function Lower extremity ultrasound-no DVT CT angiogram showing multiple left-sided emboli with multifocal pneumonia and suspected multiple nodular opacities.  Treatments: Remdesivir, dexamethasone,  heparin GTT, transition to apixaban, supportive therapy with vitamin C, zinc and D   Discharge Exam: Blood pressure 97/63, pulse 93, temperature 98.3 F (36.8 C), temperature source Oral, resp. rate 20, height 5\' 6"  (1.676 m), weight 91.6 kg, last menstrual period 07/05/2018, SpO2 100 %. General appearance: alert and no distress Head: Normocephalic, without obvious abnormality, atraumatic Resp: rhonchi bibasilar Cardio: regular rate and rhythm, S1, S2 normal, no murmur, click, rub or gallop GI: soft, non-tender; bowel sounds normal; no masses,  no organomegaly Extremities: extremities normal, atraumatic, no cyanosis or edema Skin: Skin color, texture, turgor normal. No rashes or lesions Neurologic: Alert and oriented X 3, normal strength and tone. Normal symmetric reflexes. Normal coordination and gait  Disposition: Discharge disposition: 01-Home or Self Care       Discharge Instructions    Diet - low sodium heart healthy   Complete by: As directed    Increase activity slowly   Complete by: As directed      Allergies as of 10/16/2019      Reactions   Shellfish-derived Products Anaphylaxis   Ivp Dye [iodinated Diagnostic Agents] Hives, Itching   Latex Hives, Itching   Penicillins Hives, Swelling   Has patient had a PCN reaction causing immediate rash, facial/tongue/throat swelling, SOB or lightheadedness with hypotension: Yes Has patient had a PCN reaction causing severe rash involving mucus membranes or skin necrosis: Yes Has patient had a PCN reaction that required hospitalization: Yes Has patient had a PCN reaction occurring within the last 10 years: no If all of the above answers are "NO", then may proceed with Cephalosporin use.      Medication List    TAKE these medications  Advair Diskus 250-50 MCG/DOSE Aepb Generic drug: Fluticasone-Salmeterol Inhale 1 puff into the lungs 2 (two) times daily.   apixaban 5 MG Tabs tablet Commonly known as: ELIQUIS Take 2  tablets (10 mg total) by mouth 2 (two) times daily.   apixaban 5 MG Tabs tablet Commonly known as: ELIQUIS Take 1 tablet (5 mg total) by mouth 2 (two) times daily. Start taking on: October 23, 2019   beclomethasone 40 MCG/ACT inhaler Commonly known as: QVAR Inhale 2 puffs into the lungs 2 (two) times daily.   benzonatate 100 MG capsule Commonly known as: TESSALON Take 1 capsule (100 mg total) by mouth every 8 (eight) hours.   etodolac 200 MG capsule Commonly known as: LODINE Take 200 mg by mouth every 8 (eight) hours as needed for mild pain or moderate pain.   fluticasone 50 MCG/ACT nasal spray Commonly known as: FLONASE Place 1 spray into both nostrils daily as needed for allergies or rhinitis.   gabapentin 300 MG capsule Commonly known as: NEURONTIN Take 300 mg by mouth 3 (three) times daily.   guaiFENesin-dextromethorphan 100-10 MG/5ML syrup Commonly known as: ROBITUSSIN DM Take 10 mLs by mouth every 4 (four) hours as needed for cough.   hydrochlorothiazide 12.5 MG tablet Commonly known as: HYDRODIURIL Take 12.5 mg by mouth daily.   HYDROcodone-acetaminophen 5-325 MG tablet Commonly known as: NORCO/VICODIN Take 1-2 tablets by mouth every 4 (four) hours as needed for up to 7 days for moderate pain.   magic mouthwash Soln Take 5 mLs by mouth 3 (three) times daily as needed for mouth pain.   meclizine 25 MG tablet Commonly known as: ANTIVERT Take 1 tablet (25 mg total) by mouth 2 (two) times daily as needed for dizziness (Take two times daily as needed for dizziness).   Proventil HFA 108 (90 Base) MCG/ACT inhaler Generic drug: albuterol Inhale 1 puff into the lungs daily as needed for shortness of breath.   senna-docusate 8.6-50 MG tablet Commonly known as: Senokot-S Take 1 tablet by mouth 2 (two) times daily.   tolterodine 2 MG 24 hr capsule Commonly known as: DETROL LA Take 2 mg by mouth daily.   topiramate 100 MG tablet Commonly known as: TOPAMAX Take  100 mg by mouth at bedtime.   Vitamin D (Ergocalciferol) 1.25 MG (50000 UNIT) Caps capsule Commonly known as: DRISDOL Take 50,000 Units by mouth once a week.      Total time spent on this discharge encounter is 38 minutes  Signed: Elie Confer 10/16/2019, 1:33 PM

## 2019-10-16 NOTE — Progress Notes (Addendum)
ANTICOAGULATION CONSULT NOTE - Follow Up Consult  Pharmacy Consult for Heparin Indication: pulmonary embolus  Allergies  Allergen Reactions  . Shellfish-Derived Products Anaphylaxis  . Ivp Dye [Iodinated Diagnostic Agents] Hives and Itching  . Latex Hives and Itching  . Penicillins Hives and Swelling    Has patient had a PCN reaction causing immediate rash, facial/tongue/throat swelling, SOB or lightheadedness with hypotension: Yes  Has patient had a PCN reaction causing severe rash involving mucus membranes or skin necrosis: Yes Has patient had a PCN reaction that required hospitalization: Yes Has patient had a PCN reaction occurring within the last 10 years: no If all of the above answers are "NO", then may proceed with Cephalosporin use.     Patient Measurements: Height: 5\' 6"  (167.6 cm) Weight: 202 lb (91.6 kg) IBW/kg (Calculated) : 59.3 Heparin Dosing Weight: 79.4 kg  Vital Signs: Temp: 98.7 F (37.1 C) (01/16 0358) Temp Source: Oral (01/16 0358) BP: 130/88 (01/16 0358) Pulse Rate: 104 (01/16 0358)  Labs: Recent Labs    10/14/19 0312 10/14/19 0312 10/14/19 1138 10/14/19 1806 10/15/19 0313 10/16/19 0337  HGB 10.2*   < >  --   --  10.3* 11.5*  HCT 32.2*  --   --   --  33.5* 37.3  PLT 260  --   --   --  275 305  HEPARINUNFRC 0.26*  --    < > 0.40 0.34 0.34  CREATININE 0.85  --   --   --  0.90 0.88   < > = values in this interval not displayed.    Estimated Creatinine Clearance: 92 mL/min (by C-G formula based on SCr of 0.88 mg/dL).  Assessment: 44 year old female Covid + found to have PE  10/16/2019:  heparin level = 0.34 units/mL, therapeutic  CBC: Hgb 11.5-low but improved, Pltc WNL  No bleeding or infusion related issues reported by RN  Request to change to Eliquis for discharge  Goal of Therapy:  Heparin level 0.3-0.7 units/ml Monitor platelets by anticoagulation protocol: Yes   Plan:  D/C heparin infusion & labs Start Eliquis 10mg  PO BID x7  days then transition to 5mg  PO BID Monitor closely for s/sx of bleeding Provide education & 30-day free card for Eliquis  10/18/2019, PharmD, BCPS 10/16/2019 9:02 AM  Addendum- Have tried to call patient multiple times for education with no answer.   Please have patient call pharmacy if has questions prior to discharge.  , PharmD, BCPS 10/16/2019@1 :54 PM

## 2019-10-16 NOTE — Discharge Instructions (Signed)
Information on my medicine - ELIQUIS (apixaban)  This medication education was reviewed with me or my healthcare representative as part of my discharge preparation.  The pharmacist that spoke with me during my hospital stay was:  Michelle Nyeisha Goodall, RPH  Why was Eliquis prescribed for you? Eliquis was prescribed to treat blood clots that may have been found in the veins of your legs (deep vein thrombosis) or in your lungs (pulmonary embolism) and to reduce the risk of them occurring again.  What do You need to know about Eliquis ? The starting dose is 10 mg (two 5 mg tablets) taken TWICE daily for the FIRST SEVEN (7) DAYS, then on 10/23/19  the dose is reduced to ONE 5 mg tablet taken TWICE daily.  Eliquis may be taken with or without food.   Try to take the dose about the same time in the morning and in the evening. If you have difficulty swallowing the tablet whole please discuss with your pharmacist how to take the medication safely.  Take Eliquis exactly as prescribed and DO NOT stop taking Eliquis without talking to the doctor who prescribed the medication.  Stopping may increase your risk of developing a new blood clot.  Refill your prescription before you run out.  After discharge, you should have regular check-up appointments with your healthcare provider that is prescribing your Eliquis.    What do you do if you miss a dose? If a dose of ELIQUIS is not taken at the scheduled time, take it as soon as possible on the same day and twice-daily administration should be resumed. The dose should not be doubled to make up for a missed dose.  Important Safety Information A possible side effect of Eliquis is bleeding. You should call your healthcare provider right away if you experience any of the following: ? Bleeding from an injury or your nose that does not stop. ? Unusual colored urine (red or dark brown) or unusual colored stools (red or black). ? Unusual bruising for unknown  reasons. ? A serious fall or if you hit your head (even if there is no bleeding).  Some medicines may interact with Eliquis and might increase your risk of bleeding or clotting while on Eliquis. To help avoid this, consult your healthcare provider or pharmacist prior to using any new prescription or non-prescription medications, including herbals, vitamins, non-steroidal anti-inflammatory drugs (NSAIDs) and supplements.  This website has more information on Eliquis (apixaban): http://www.eliquis.com/eliquis/home  

## 2019-10-17 LAB — CULTURE, BLOOD (ROUTINE X 2)
Culture: NO GROWTH
Culture: NO GROWTH
Special Requests: ADEQUATE
Special Requests: ADEQUATE

## 2020-01-25 ENCOUNTER — Other Ambulatory Visit: Payer: Self-pay

## 2020-01-25 ENCOUNTER — Encounter (HOSPITAL_BASED_OUTPATIENT_CLINIC_OR_DEPARTMENT_OTHER): Payer: Self-pay | Admitting: Emergency Medicine

## 2020-01-25 DIAGNOSIS — Y929 Unspecified place or not applicable: Secondary | ICD-10-CM | POA: Diagnosis not present

## 2020-01-25 DIAGNOSIS — I10 Essential (primary) hypertension: Secondary | ICD-10-CM | POA: Insufficient documentation

## 2020-01-25 DIAGNOSIS — Z7901 Long term (current) use of anticoagulants: Secondary | ICD-10-CM | POA: Diagnosis not present

## 2020-01-25 DIAGNOSIS — J45909 Unspecified asthma, uncomplicated: Secondary | ICD-10-CM | POA: Insufficient documentation

## 2020-01-25 DIAGNOSIS — Z79899 Other long term (current) drug therapy: Secondary | ICD-10-CM | POA: Insufficient documentation

## 2020-01-25 DIAGNOSIS — Y999 Unspecified external cause status: Secondary | ICD-10-CM | POA: Insufficient documentation

## 2020-01-25 DIAGNOSIS — Y939 Activity, unspecified: Secondary | ICD-10-CM | POA: Insufficient documentation

## 2020-01-25 DIAGNOSIS — X58XXXA Exposure to other specified factors, initial encounter: Secondary | ICD-10-CM | POA: Insufficient documentation

## 2020-01-25 DIAGNOSIS — L235 Allergic contact dermatitis due to other chemical products: Secondary | ICD-10-CM | POA: Insufficient documentation

## 2020-01-25 DIAGNOSIS — M25572 Pain in left ankle and joints of left foot: Secondary | ICD-10-CM | POA: Insufficient documentation

## 2020-01-25 NOTE — ED Triage Notes (Signed)
Pt awoke with left foot pain. No known injury

## 2020-01-26 ENCOUNTER — Emergency Department (HOSPITAL_BASED_OUTPATIENT_CLINIC_OR_DEPARTMENT_OTHER): Payer: 59

## 2020-01-26 ENCOUNTER — Emergency Department (HOSPITAL_BASED_OUTPATIENT_CLINIC_OR_DEPARTMENT_OTHER)
Admission: EM | Admit: 2020-01-26 | Discharge: 2020-01-26 | Disposition: A | Discharge status: Home / Self Care | Payer: 59 | Attending: Emergency Medicine | Admitting: Emergency Medicine

## 2020-01-26 DIAGNOSIS — M25572 Pain in left ankle and joints of left foot: Secondary | ICD-10-CM

## 2020-01-26 MED ORDER — OXYCODONE-ACETAMINOPHEN 5-325 MG PO TABS: 1.0000 | ORAL_TABLET | ORAL | 0 refills | Status: DC | PRN

## 2020-01-26 MED ORDER — KETOROLAC TROMETHAMINE 30 MG/ML IJ SOLN
30.0000 mg | Freq: Once | INTRAMUSCULAR | Status: AC
  Administered 2020-01-26: 02:00:00 30 mg via INTRAMUSCULAR
  Filled 2020-01-26: qty 1

## 2020-01-26 MED ORDER — NAPROXEN 375 MG PO TABS: ORAL_TABLET | ORAL | 0 refills | Status: AC

## 2020-01-26 NOTE — ED Provider Notes (Signed)
[facility] Provider Note: Lowella Dell, MD, FACEP  CSN: [id number] MRN: [id number] ARRIVAL: 01/25/20 at 2307 ROOM: [facility]   CHIEF COMPLAINT  Foot Pain   HISTORY OF PRESENT ILLNESS  01/26/20 1:36 AM Veronica Odom is a 44 y.o. female who awoke with severe pain in her medial left ankle just prior to arrival.  She knows of no injury.  There is no associated erythema or ecchymosis.  She has no history of gout.  She took Tylenol with out adequate relief.  She is using crutches for ambulation.   Past Medical History:  Diagnosis Date  . Asthma   . Hypertension     Past Surgical History:  Procedure Laterality Date  . ABDOMINAL HYSTERECTOMY    . BREAST SURGERY    . CHOLECYSTECTOMY    . KNEE SURGERY    . TUBAL LIGATION      Family History  Problem Relation Age of Onset  . Heart failure Mother     Social History   Tobacco Use  . Smoking status: Never Smoker  . Smokeless tobacco: Never Used  Substance Use Topics  . Alcohol use: Yes    Comment: occ  . Drug use: No    Prior to Admission medications   Medication Sig Start Date End Date Taking? Authorizing Provider  ADVAIR DISKUS 250-50 MCG/DOSE AEPB Inhale 1 puff into the lungs 2 (two) times daily. 05/17/19   [provider]  albuterol (PROVENTIL HFA) 108 (90 Base) MCG/ACT inhaler Inhale 1 puff into the lungs daily as needed for shortness of breath. 12/19/16   [provider]  apixaban (ELIQUIS) 5 MG TABS tablet Take 2 tablets (10 mg total) by mouth 2 (two) times daily. 10/16/19   Aquilla Hacker, MD  apixaban (ELIQUIS) 5 MG TABS tablet Take 1 tablet (5 mg total) by mouth 2 (two) times daily. 10/23/19   Aquilla Hacker, MD  beclomethasone (QVAR) 40 MCG/ACT inhaler Inhale 2 puffs into the lungs 2 (two) times daily.    [provider]  benzonatate (TESSALON) 100 MG capsule Take 1 capsule (100 mg total) by mouth every 8 (eight) hours. 10/04/19   Linwood Dibbles, MD  fluticasone (FLONASE) 50  MCG/ACT nasal spray Place 1 spray into both nostrils daily as needed for allergies or rhinitis.  12/13/16   [provider]  gabapentin (NEURONTIN) 300 MG capsule Take 300 mg by mouth 3 (three) times daily. 07/14/19   [provider]  guaiFENesin-dextromethorphan (ROBITUSSIN DM) 100-10 MG/5ML syrup Take 10 mLs by mouth every 4 (four) hours as needed for cough. 10/16/19   Aquilla Hacker, MD  hydrochlorothiazide (HYDRODIURIL) 12.5 MG tablet Take 12.5 mg by mouth daily. 09/17/19   [provider]  magic mouthwash SOLN Take 5 mLs by mouth 3 (three) times daily as needed for mouth pain. Patient not taking: Reported on 10/12/2019 04/10/18   Kellie Shropshire, PA-C  meclizine (ANTIVERT) 25 MG tablet Take 1 tablet (25 mg total) by mouth 2 (two) times daily as needed for dizziness (Take two times daily as needed for dizziness). 12/30/17   Lanelle Bal, MD  naproxen (NAPROSYN) 375 MG tablet Take 1 tablet twice daily for ankle pain. 01/26/20   Leni Pankonin, MD  oxyCODONE-acetaminophen (PERCOCET) 5-325 MG tablet Take 1 tablet by mouth every 4 (four) hours as needed for severe pain. 01/26/20   Caroly Purewal, MD  senna-docusate (SENOKOT-S) 8.6-50 MG tablet Take 1 tablet by mouth 2 (two) times daily. 10/16/19   [place],  Waneta Martins, MD  tolterodine (DETROL LA) 2 MG 24 hr capsule Take 2 mg by mouth daily. 06/17/19   [provider]  topiramate (TOPAMAX) 100 MG tablet Take 100 mg by mouth at bedtime. 09/17/19   [provider]  Vitamin D, Ergocalciferol, (DRISDOL) 1.25 MG (50000 UNIT) CAPS capsule Take 50,000 Units by mouth once a week. 10/12/19   [provider]    Allergies Shellfish-derived products, Ivp dye [iodinated diagnostic agents], Latex, and Penicillins   REVIEW OF SYSTEMS  Negative except as noted here or in the History of Present Illness.   PHYSICAL EXAMINATION  Initial Vital Signs Blood pressure 128/88, pulse (!) 101, temperature 99.4 F (37.4  C), temperature source Oral, resp. rate 16, height 5' 5.5" (1.664 m), weight 89.8 kg, last menstrual period 07/05/2018, SpO2 100 %.  Examination General: Well-developed, well-nourished female in no acute distress; appearance consistent with age of record HENT: normocephalic; atraumatic Eyes: Normal appearance Neck: supple Heart: regular rate and rhythm Lungs: clear to auscultation bilaterally Abdomen: soft; nondistended; bowel sounds present Extremities: No deformity; pulses +1; tenderness and mild swelling of medial left ankle without erythema, warmth or ecchymosis Neurologic: Awake, alert and oriented; motor function intact in all extremities and symmetric; no facial droop Skin: Warm and dry Psychiatric: Normal mood and affect   RESULTS  Summary of this visit's results, reviewed and interpreted by myself:   EKG Interpretation  Date/Time:    Ventricular Rate:    PR Interval:    QRS Duration:   QT Interval:    QTC Calculation:   R Axis:     Text Interpretation:        Laboratory Studies: No results found for this or any previous visit (from the past 24 hour(s)). Imaging Studies: DG Ankle Complete Left  Result Date: 01/26/2020 CLINICAL DATA:  Left ankle pain EXAM: LEFT ANKLE COMPLETE - 3+ VIEW COMPARISON:  None. FINDINGS: There is no evidence of fracture, dislocation, or joint effusion. There is no evidence of arthropathy or other focal bone abnormality. Diffuse soft tissue swelling seen over the lateral malleolus. Tiny enthesophytes seen at the calcaneal insertion site. IMPRESSION: No acute osseous abnormality. Electronically Signed   By: Prudencio Pair M.D.   On: 01/26/2020 00:57   DG Foot Complete Left  Result Date: 01/26/2020 CLINICAL DATA:  Ankle and foot pain EXAM: LEFT FOOT - COMPLETE 3+ VIEW COMPARISON:  None. FINDINGS: There is no evidence of fracture or dislocation. 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 Soft tissue swelling seen over the  dorsum of the foot. IMPRESSION: No acute osseous abnormality. Diffuse soft tissue swelling over the dorsum of the foot. Electronically Signed   By: Prudencio Pair M.D.   On: 01/26/2020 00:58    ED COURSE and MDM  Nursing notes, initial and subsequent vitals signs, including pulse oximetry, reviewed and interpreted by myself.  Vitals:   01/25/20 2319 01/25/20 2320  BP:  128/88  Pulse:  (!) 101  Resp:  16  Temp:  99.4 F (37.4 C)  TempSrc:  Oral  SpO2:  100%  Weight: 89.8 kg   Height: 5' 5.5" (1.664 m)    Medications  ketorolac (TORADOL) 30 MG/ML injection 30 mg (has no administration in time range)    This could represent gout given its acute onset and severity although it is an atypical location for an initial gout attack.  We will treat with an NSAID and a brief course of narcotics and refer to her  PCP.  PROCEDURES  Procedures   ED DIAGNOSES     ICD-10-CM   1. Acute left ankle pain  M25.572        Ausar Georgiou, Jonny Ruiz, MD 01/26/20 [phone]

## 2020-07-10 ENCOUNTER — Emergency Department (HOSPITAL_BASED_OUTPATIENT_CLINIC_OR_DEPARTMENT_OTHER): Payer: 59

## 2020-07-10 ENCOUNTER — Other Ambulatory Visit: Payer: Self-pay

## 2020-07-10 ENCOUNTER — Encounter (HOSPITAL_BASED_OUTPATIENT_CLINIC_OR_DEPARTMENT_OTHER): Payer: Self-pay | Admitting: *Deleted

## 2020-07-10 DIAGNOSIS — M7989 Other specified soft tissue disorders: Secondary | ICD-10-CM | POA: Diagnosis not present

## 2020-07-10 DIAGNOSIS — Z8616 Personal history of COVID-19: Secondary | ICD-10-CM | POA: Insufficient documentation

## 2020-07-10 DIAGNOSIS — Z79899 Other long term (current) drug therapy: Secondary | ICD-10-CM | POA: Diagnosis not present

## 2020-07-10 DIAGNOSIS — J45909 Unspecified asthma, uncomplicated: Secondary | ICD-10-CM | POA: Insufficient documentation

## 2020-07-10 DIAGNOSIS — Z7901 Long term (current) use of anticoagulants: Secondary | ICD-10-CM | POA: Diagnosis not present

## 2020-07-10 DIAGNOSIS — Z91040 Latex allergy status: Secondary | ICD-10-CM | POA: Insufficient documentation

## 2020-07-10 DIAGNOSIS — I10 Essential (primary) hypertension: Secondary | ICD-10-CM | POA: Diagnosis not present

## 2020-07-10 DIAGNOSIS — M79671 Pain in right foot: Secondary | ICD-10-CM | POA: Diagnosis present

## 2020-07-10 NOTE — ED Triage Notes (Signed)
C/o right foot pain and swelling x 1 day denies injury

## 2020-07-11 ENCOUNTER — Emergency Department (HOSPITAL_COMMUNITY): Admission: RE | Admit: 2020-07-11 | Payer: 59 | Source: Ambulatory Visit

## 2020-07-11 ENCOUNTER — Emergency Department (HOSPITAL_BASED_OUTPATIENT_CLINIC_OR_DEPARTMENT_OTHER)
Admission: EM | Admit: 2020-07-11 | Discharge: 2020-07-11 | Disposition: A | Discharge status: Home / Self Care | Payer: 59 | Attending: Emergency Medicine | Admitting: Emergency Medicine

## 2020-07-11 ENCOUNTER — Encounter (HOSPITAL_BASED_OUTPATIENT_CLINIC_OR_DEPARTMENT_OTHER): Payer: Self-pay | Admitting: Emergency Medicine

## 2020-07-11 DIAGNOSIS — M79671 Pain in right foot: Secondary | ICD-10-CM

## 2020-07-11 MED ORDER — KETOROLAC TROMETHAMINE 60 MG/2ML IM SOLN
30.0000 mg | Freq: Once | INTRAMUSCULAR | Status: AC
  Administered 2020-07-11: 30 mg via INTRAMUSCULAR
  Filled 2020-07-11: qty 2

## 2020-07-11 MED ORDER — LIDOCAINE 5 % EX PTCH
1.0000 | MEDICATED_PATCH | CUTANEOUS | Status: DC
  Administered 2020-07-11: 1 via TRANSDERMAL
  Filled 2020-07-11: qty 1

## 2020-07-11 MED ORDER — LIDOCAINE 5 % EX PTCH: 1.0000 | MEDICATED_PATCH | CUTANEOUS | 0 refills | Status: AC

## 2020-07-11 MED ORDER — DICLOFENAC SODIUM 1 % EX GEL: 4.0000 g | Freq: Four times a day (QID) | CUTANEOUS | 0 refills | Status: AC

## 2020-07-11 NOTE — ED Provider Notes (Signed)
[facility] EMERGENCY DEPARTMENT Provider Note   CSN: [id number] Arrival date & time: 07/10/20  2159     History Chief Complaint  Patient presents with  . Foot Pain    Veronica Odom is a 44 y.o. female.  The history is provided by the patient.  Foot Pain This is a new problem. The current episode started yesterday. The problem occurs constantly. The problem has not changed since onset.Pertinent negatives include no chest pain, no abdominal pain, no headaches and no shortness of breath. Nothing aggravates the symptoms. Nothing relieves the symptoms. She has tried nothing for the symptoms. The treatment provided no relief.  Patient awoke yesterday from sleep with R foot swelling and pain.  No trauma.  No changes in color.  No CP, no SOB.  No leg pain.  No travel, has not missed any doses of eliquis.       Past Medical History:  Diagnosis Date  . Asthma   . Hypertension     Patient Active Problem List   Diagnosis Date Noted  . COVID-19 virus infection 10/13/2019  . Pneumonia due to COVID-19 virus 10/13/2019  . Pulmonary nodules 10/13/2019  . Acute pulmonary embolism (HCC) 10/12/2019  . Acute vestibular syndrome 12/29/2017  . Acute peripheral vestibulopathy, left 12/28/2017    Past Surgical History:  Procedure Laterality Date  . ABDOMINAL HYSTERECTOMY    . BREAST SURGERY    . CHOLECYSTECTOMY    . KNEE SURGERY    . TUBAL LIGATION       OB History    Gravida  4   Para  4   Term      Preterm      AB      Living        SAB      TAB      Ectopic      Multiple      Live Births              Family History  Problem Relation Age of Onset  . Heart failure Mother     Social History   Tobacco Use  . Smoking status: Never Smoker  . Smokeless tobacco: Never Used  Vaping Use  . Vaping Use: Never used  Substance Use Topics  . Alcohol use: Yes    Comment: occ  . Drug use: No    Home Medications Prior to Admission medications     Medication Sig Start Date End Date Taking? Authorizing Provider  ADVAIR DISKUS 250-50 MCG/DOSE AEPB Inhale 1 puff into the lungs 2 (two) times daily. 05/17/19   [provider]  albuterol (PROVENTIL HFA) 108 (90 Base) MCG/ACT inhaler Inhale 1 puff into the lungs daily as needed for shortness of breath. 12/19/16   [provider]  apixaban (ELIQUIS) 5 MG TABS tablet Take 2 tablets (10 mg total) by mouth 2 (two) times daily. 10/16/19   Aquilla Hacker, MD  apixaban (ELIQUIS) 5 MG TABS tablet Take 1 tablet (5 mg total) by mouth 2 (two) times daily. 10/23/19   Aquilla Hacker, MD  beclomethasone (QVAR) 40 MCG/ACT inhaler Inhale 2 puffs into the lungs 2 (two) times daily.    [provider]  benzonatate (TESSALON) 100 MG capsule Take 1 capsule (100 mg total) by mouth every 8 (eight) hours. 10/04/19   Linwood Dibbles, MD  fluticasone (FLONASE) 50 MCG/ACT nasal spray Place 1 spray into both nostrils daily as needed for allergies or rhinitis.  12/13/16  [provider]  gabapentin (NEURONTIN) 300 MG capsule Take 300 mg by mouth 3 (three) times daily. 07/14/19   [provider]  guaiFENesin-dextromethorphan (ROBITUSSIN DM) 100-10 MG/5ML syrup Take 10 mLs by mouth every 4 (four) hours as needed for cough. 10/16/19   Aquilla Hacker, MD  hydrochlorothiazide (HYDRODIURIL) 12.5 MG tablet Take 12.5 mg by mouth daily. 09/17/19   [provider]  magic mouthwash SOLN Take 5 mLs by mouth 3 (three) times daily as needed for mouth pain. Patient not taking: Reported on 10/12/2019 04/10/18   Kellie Shropshire, PA-C  meclizine (ANTIVERT) 25 MG tablet Take 1 tablet (25 mg total) by mouth 2 (two) times daily as needed for dizziness (Take two times daily as needed for dizziness). 12/30/17   Lanelle Bal, MD  naproxen (NAPROSYN) 375 MG tablet Take 1 tablet twice daily for ankle pain. 01/26/20   Molpus, John, MD  oxyCODONE-acetaminophen (PERCOCET) 5-325 MG tablet Take 1  tablet by mouth every 4 (four) hours as needed for severe pain. 01/26/20   Molpus, John, MD  senna-docusate (SENOKOT-S) 8.6-50 MG tablet Take 1 tablet by mouth 2 (two) times daily. 10/16/19   Aquilla Hacker, MD  tolterodine (DETROL LA) 2 MG 24 hr capsule Take 2 mg by mouth daily. 06/17/19   [provider]  topiramate (TOPAMAX) 100 MG tablet Take 100 mg by mouth at bedtime. 09/17/19   [provider]  Vitamin D, Ergocalciferol, (DRISDOL) 1.25 MG (50000 UNIT) CAPS capsule Take 50,000 Units by mouth once a week. 10/12/19   [provider]    Allergies    Shellfish-derived products, Ivp dye [iodinated diagnostic agents], Latex, and Penicillins  Review of Systems   Review of Systems  Constitutional: Negative for fever.  HENT: Negative for congestion.   Eyes: Negative for visual disturbance.  Respiratory: Negative for shortness of breath.   Cardiovascular: Negative for chest pain.  Gastrointestinal: Negative for abdominal pain.  Genitourinary: Negative for difficulty urinating.  Musculoskeletal: Positive for arthralgias.  Skin: Negative for rash and wound.  Neurological: Negative for headaches.  Psychiatric/Behavioral: Negative for agitation.  All other systems reviewed and are negative.   Physical Exam Updated Vital Signs BP 123/89   Pulse 100   Temp 99 F (37.2 C) (Oral)   Resp 18   Ht 5\' 5"  (1.651 m)   Wt 95.3 kg   LMP 07/05/2018   SpO2 100%   BMI 34.95 kg/m   Physical Exam Vitals and nursing note reviewed.  Constitutional:      General: She is not in acute distress.    Appearance: Normal appearance.  HENT:     Head: Normocephalic and atraumatic.     Nose: Nose normal.  Eyes:     Conjunctiva/sclera: Conjunctivae normal.     Pupils: Pupils are equal, round, and reactive to light.  Cardiovascular:     Rate and Rhythm: Normal rate and regular rhythm.     Pulses: Normal pulses.     Heart sounds: Normal heart sounds.  Pulmonary:     Effort:  Pulmonary effort is normal.     Breath sounds: Normal breath sounds.  Abdominal:     General: Abdomen is flat. Bowel sounds are normal.     Palpations: Abdomen is soft.     Tenderness: There is no abdominal tenderness. There is no guarding.  Musculoskeletal:        General: Normal range of motion.     Cervical back: Normal range of motion and neck  supple.     Right lower leg: Normal.     Right ankle: Normal.     Right Achilles Tendon: Normal.     Right foot: Normal.     Left foot: Normal.  Skin:    General: Skin is warm and dry.     Capillary Refill: Capillary refill takes less than 2 seconds.     Findings: No erythema.  Neurological:     General: No focal deficit present.     Mental Status: She is alert and oriented to person, place, and time.  Psychiatric:        Mood and Affect: Mood normal.        Behavior: Behavior normal.     ED Results / Procedures / Treatments   Labs (all labs ordered are listed, but only abnormal results are displayed) Labs Reviewed - No data to display  EKG None  Radiology DG Foot Complete Right  Result Date: 07/10/2020 CLINICAL DATA:  Right foot pain and swelling for 1 day, no known injury EXAM: RIGHT FOOT COMPLETE - 3+ VIEW COMPARISON:  None FINDINGS: Mild diffuse swelling appears somewhat edematous. Correlate with clinical findings. No acute bony abnormality. Specifically, no fracture, subluxation, or dislocation. Corticated fragmentation seen adjacent the lateral aspect first metatarsal head may be related to some spurring and arthrosis about the hallucal sesamoidal complexes. Hallux valgus deformity is noted as well. Forefoot alignment is otherwise maintained. Mid and hindfoot alignment including the Chopart and Lisfranc articulations is grossly maintained within the limitations of this nonweightbearing radiograph. Bidirectional calcaneal spurs are noted. Tiny accessory navicular is seen. 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 IMPRESSION: 1. Diffuse soft tissue swelling, possibly edema. 2. No acute osseous abnormality. 3. Corticated fragmentation adjacent the first metatarsal head may be related to spurring and arthrosis about the hallucal sesamoidal complexes. Correlate for sequela of prior sesamoiditis. 4. Hallux valgus/bunion. Electronically Signed   By: Kreg ShropshirePrice  DeHay M.D.   On: 07/10/2020 23:16    Procedures Procedures (including critical care time)  Medications Ordered in ED Medications - No data to display  ED Course  I have reviewed the triage vital signs and the nursing notes.  Pertinent labs & imaging results that were available during my care of the patient were reviewed by me and considered in my medical decision making (see chart for details).    No acute findings on Xray. FROM 3+ dorsalis pedis and posterior tibial.  Cap refill is < 2 seconds.  Normal color and temperature of the foot.  There is no apparent swelling nor warmth on exam to suggest gout.  Will set up for outpatient DVT study.  I will start lidoderm and voltaren gel for pain.  May continue tylenol.    Veronica Odom was evaluated in Emergency Department on 07/11/2020 for the symptoms described in the history of present illness. She was evaluated in the context of the global COVID-19 pandemic, which necessitated consideration that the patient might be at risk for infection with the SARS-CoV-2 virus that causes COVID-19. Institutional protocols and algorithms that pertain to the evaluation of patients at risk for COVID-19 are in a state of rapid change based on information released by regulatory bodies including the CDC and federal and state organizations. These policies and algorithms were followed during the patient's care in the ED.  Final Clinical Impression(s) / ED Diagnoses  Return for intractable cough, coughing up blood,fevers >100.4 unrelieved by medication, shortness of breath, intractable vomiting, chest pain,  shortness of breath, weakness,numbness, changes in speech, facial asymmetry,abdominal pain, passing out,Inability to tolerate liquids or food, cough, altered mental status or any concerns. No signs of systemic illness or infection. The patient is nontoxic-appearing on exam and vital signs are within normal limits.   I have reviewed the triage vital signs and the nursing notes. Pertinent labs &imaging results that were available during my care of the patient were reviewed by me and considered in my medical decision making (see chart for details).After history, exam, and medical workup I feel the patient has beenappropriately medically screened and is safe for discharge home. Pertinent diagnoses were discussed with the patient. Patient was given return precautions.      Prudence Heiny, MD 07/11/20 4650

## 2020-12-27 ENCOUNTER — Emergency Department (HOSPITAL_BASED_OUTPATIENT_CLINIC_OR_DEPARTMENT_OTHER)
Admission: EM | Admit: 2020-12-27 | Discharge: 2020-12-27 | Disposition: A | Discharge status: Home / Self Care | Payer: 59 | Attending: Emergency Medicine | Admitting: Emergency Medicine

## 2020-12-27 ENCOUNTER — Other Ambulatory Visit: Payer: Self-pay

## 2020-12-27 ENCOUNTER — Other Ambulatory Visit (HOSPITAL_COMMUNITY): Payer: Self-pay | Admitting: Physician Assistant

## 2020-12-27 ENCOUNTER — Encounter (HOSPITAL_BASED_OUTPATIENT_CLINIC_OR_DEPARTMENT_OTHER): Payer: Self-pay

## 2020-12-27 DIAGNOSIS — R42 Dizziness and giddiness: Secondary | ICD-10-CM | POA: Diagnosis not present

## 2020-12-27 DIAGNOSIS — R110 Nausea: Secondary | ICD-10-CM | POA: Diagnosis not present

## 2020-12-27 DIAGNOSIS — J45909 Unspecified asthma, uncomplicated: Secondary | ICD-10-CM | POA: Diagnosis not present

## 2020-12-27 DIAGNOSIS — Z8616 Personal history of COVID-19: Secondary | ICD-10-CM | POA: Insufficient documentation

## 2020-12-27 DIAGNOSIS — Z7951 Long term (current) use of inhaled steroids: Secondary | ICD-10-CM | POA: Insufficient documentation

## 2020-12-27 DIAGNOSIS — I10 Essential (primary) hypertension: Secondary | ICD-10-CM | POA: Diagnosis not present

## 2020-12-27 DIAGNOSIS — Z7901 Long term (current) use of anticoagulants: Secondary | ICD-10-CM | POA: Diagnosis not present

## 2020-12-27 DIAGNOSIS — Z91040 Latex allergy status: Secondary | ICD-10-CM | POA: Diagnosis not present

## 2020-12-27 DIAGNOSIS — Z79899 Other long term (current) drug therapy: Secondary | ICD-10-CM | POA: Diagnosis not present

## 2020-12-27 LAB — COMPREHENSIVE METABOLIC PANEL
ALT: 23 U/L (ref 0–44)
AST: 22 U/L (ref 15–41)
Albumin: 3.8 g/dL (ref 3.5–5.0)
Alkaline Phosphatase: 72 U/L (ref 38–126)
Anion gap: 8 (ref 5–15)
BUN: 16 mg/dL (ref 6–20)
CO2: 23 mmol/L (ref 22–32)
Calcium: 9 mg/dL (ref 8.9–10.3)
Chloride: 103 mmol/L (ref 98–111)
Creatinine, Ser: 0.85 mg/dL (ref 0.44–1.00)
GFR, Estimated: >60 mL/min (ref >60)
Glucose, Bld: 129 mg/dL — ABNORMAL HIGH (ref 70–99)
Potassium: 4.1 mmol/L (ref 3.5–5.1)
Sodium: 134 mmol/L — ABNORMAL LOW (ref 135–145)
Total Bilirubin: 0.5 mg/dL (ref 0.3–1.2)
Total Protein: 7.6 g/dL (ref 6.5–8.1)

## 2020-12-27 LAB — CBC WITH DIFFERENTIAL/PLATELET
Abs Immature Granulocytes: 0.02 10*3/uL (ref 0.00–0.07)
Basophils Absolute: 0 10*3/uL (ref 0.0–0.1)
Basophils Relative: 1 %
Eosinophils Absolute: 0 10*3/uL (ref 0.0–0.5)
Eosinophils Relative: 1 %
HCT: 39.7 % (ref 36.0–46.0)
Hemoglobin: 13 g/dL (ref 12.0–15.0)
Immature Granulocytes: 0 %
Lymphocytes Relative: 34 %
Lymphs Abs: 1.7 10*3/uL (ref 0.7–4.0)
MCH: 32.2 pg (ref 26.0–34.0)
MCHC: 32.7 g/dL (ref 30.0–36.0)
MCV: 98.3 fL (ref 80.0–100.0)
Monocytes Absolute: 0.3 10*3/uL (ref 0.1–1.0)
Monocytes Relative: 7 %
Neutro Abs: 2.9 10*3/uL (ref 1.7–7.7)
Neutrophils Relative %: 57 %
Platelets: 237 10*3/uL (ref 150–400)
RBC: 4.04 MIL/uL (ref 3.87–5.11)
RDW: 12.1 % (ref 11.5–15.5)
WBC: 5 10*3/uL (ref 4.0–10.5)
nRBC: 0 % (ref 0.0–0.2)

## 2020-12-27 MED ORDER — SODIUM CHLORIDE 0.9 % IV BOLUS
500.0000 mL | Freq: Once | INTRAVENOUS | Status: AC
  Administered 2020-12-27: 500 mL via INTRAVENOUS

## 2020-12-27 MED ORDER — MECLIZINE HCL 25 MG PO TABS: 25.0000 mg | ORAL_TABLET | Freq: Three times a day (TID) | ORAL | 0 refills | Status: DC | PRN

## 2020-12-27 MED ORDER — MECLIZINE HCL 25 MG PO TABS: 25.0000 mg | ORAL_TABLET | Freq: Three times a day (TID) | ORAL | 0 refills | Status: AC | PRN

## 2020-12-27 MED ORDER — MECLIZINE HCL 25 MG PO TABS
25.0000 mg | ORAL_TABLET | Freq: Once | ORAL | Status: AC
  Administered 2020-12-27: 25 mg via ORAL
  Filled 2020-12-27: qty 1

## 2020-12-27 NOTE — ED Provider Notes (Signed)
[facility] EMERGENCY DEPARTMENT Provider Note   CSN: [id number] Arrival date & time: 12/27/20  1020     History Chief Complaint  Patient presents with  . Nausea  . Dizziness    Room spinning; nausea since 5a.m.    Veronica Odom is a 45 y.o. female.  The history is provided by the patient. No language interpreter was used.  Dizziness Quality:  Lightheadedness and vertigo Severity:  Moderate Timing:  Constant Progression:  Worsening  Pt reports she feels feels like she has vertigo again.  Pt reports she has had in the past.  Pt has rx for vertigo but it is expired.  Pt had MRi of head and neck for similar 2 years ago and was normal     Past Medical History:  Diagnosis Date  . Asthma   . Hypertension     Patient Active Problem List   Diagnosis Date Noted  . COVID-19 virus infection 10/13/2019  . Pneumonia due to COVID-19 virus 10/13/2019  . Pulmonary nodules 10/13/2019  . Acute pulmonary embolism (HCC) 10/12/2019  . Acute vestibular syndrome 12/29/2017  . Acute peripheral vestibulopathy, left 12/28/2017    Past Surgical History:  Procedure Laterality Date  . ABDOMINAL HYSTERECTOMY    . BREAST SURGERY    . CHOLECYSTECTOMY    . KNEE SURGERY    . TUBAL LIGATION       OB History    Gravida  4   Para  4   Term      Preterm      AB      Living        SAB      IAB      Ectopic      Multiple      Live Births              Family History  Problem Relation Age of Onset  . Heart failure Mother     Social History   Tobacco Use  . Smoking status: Never Smoker  . Smokeless tobacco: Never Used  Vaping Use  . Vaping Use: Never used  Substance Use Topics  . Alcohol use: Not Currently    Comment: occ  . Drug use: No    Home Medications Prior to Admission medications   Medication Sig Start Date End Date Taking? Authorizing Provider  ADVAIR DISKUS 250-50 MCG/DOSE AEPB Inhale 1 puff into the lungs 2 (two) times daily. 05/17/19   Yes [provider]  albuterol (VENTOLIN HFA) 108 (90 Base) MCG/ACT inhaler Inhale 1 puff into the lungs daily as needed for shortness of breath. 12/19/16  Yes [provider]  diclofenac Sodium (VOLTAREN) 1 % GEL Apply 4 g topically 4 (four) times daily. 07/11/20  Yes Palumbo, April, MD  fluticasone [facility]) 50 MCG/ACT nasal spray Place 1 spray into both nostrils daily as needed for allergies or rhinitis.  12/13/16  Yes [provider]  gabapentin (NEURONTIN) 300 MG capsule Take 300 mg by mouth 3 (three) times daily. 07/14/19  Yes [provider]  guaiFENesin-dextromethorphan (ROBITUSSIN DM) 100-10 MG/5ML syrup Take 10 mLs by mouth every 4 (four) hours as needed for cough. 10/16/19  Yes Aquilla Hacker, MD  meclizine (ANTIVERT) 25 MG tablet Take 1 tablet (25 mg total) by mouth 3 (three) times daily as needed for dizziness. 12/27/20  Yes Cheron Schaumann K, PA-C  naproxen (NAPROSYN) 375 MG tablet Take 1 tablet twice daily for ankle pain. 01/26/20  Yes Molpus, Jonny Ruiz, MD  senna-docusate (SENOKOT-S) 8.6-50 MG tablet Take 1 tablet by mouth 2 (two) times daily. 10/16/19  Yes Aquilla Hacker, MD  tolterodine (DETROL LA) 2 MG 24 hr capsule Take 2 mg by mouth daily. 06/17/19  Yes [provider]  topiramate (TOPAMAX) 100 MG tablet Take 100 mg by mouth at bedtime. 09/17/19  Yes [provider]  Vitamin D, Ergocalciferol, (DRISDOL) 1.25 MG (50000 UNIT) CAPS capsule Take 50,000 Units by mouth once a week. 10/12/19  Yes [provider]  apixaban (ELIQUIS) 5 MG TABS tablet Take 2 tablets (10 mg total) by mouth 2 (two) times daily. Patient not taking: Reported on 12/27/2020 10/16/19   Aquilla Hacker, MD  apixaban (ELIQUIS) 5 MG TABS tablet Take 1 tablet (5 mg total) by mouth 2 (two) times daily. Patient not taking: Reported on 12/27/2020 10/23/19   Aquilla Hacker, MD  beclomethasone (QVAR) 40 MCG/ACT inhaler Inhale 2 puffs into the lungs 2 (two) times  daily. Patient not taking: Reported on 12/27/2020    [provider]  benzonatate (TESSALON) 100 MG capsule Take 1 capsule (100 mg total) by mouth every 8 (eight) hours. 10/04/19   Linwood Dibbles, MD  hydrochlorothiazide (HYDRODIURIL) 12.5 MG tablet Take 12.5 mg by mouth daily. 09/17/19   [provider]  lidocaine (LIDODERM) 5 % Place 1 patch onto the skin daily. Remove & Discard patch within 12 hours or as directed by MD 07/11/20   Palumbo, April, MD  magic mouthwash SOLN Take 5 mLs by mouth 3 (three) times daily as needed for mouth pain. Patient not taking: Reported on 10/12/2019 04/10/18   Kellie Shropshire, PA-C  oxyCODONE-acetaminophen (PERCOCET) 5-325 MG tablet Take 1 tablet by mouth every 4 (four) hours as needed for severe pain. 01/26/20   Molpus, John, MD    Allergies    Shellfish-derived products, Ivp dye [iodinated diagnostic agents], Latex, and Penicillins  Review of Systems   Review of Systems  Neurological: Positive for dizziness.  All other systems reviewed and are negative.   Physical Exam Updated Vital Signs BP 111/86   Pulse 79   Temp 97.9 F (36.6 C) (Oral)   Resp 14   Ht 5\' 3"  (1.6 m)   Wt 84.8 kg   LMP 07/05/2018   SpO2 99%   BMI 33.13 kg/m   Physical Exam Vitals and nursing note reviewed.  Constitutional:      Appearance: She is well-developed.  HENT:     Head: Normocephalic.     Right Ear: Tympanic membrane normal.     Left Ear: Tympanic membrane normal.     Nose: Nose normal.     Mouth/Throat:     Mouth: Mucous membranes are dry.  Eyes:     Extraocular Movements: Extraocular movements intact.     Pupils: Pupils are equal, round, and reactive to light.  Cardiovascular:     Rate and Rhythm: Normal rate.  Pulmonary:     Effort: Pulmonary effort is normal.  Abdominal:     General: There is no distension.  Musculoskeletal:        General: Normal range of motion.     Cervical back: Normal range of motion.  Neurological:     General:  No focal deficit present.     Mental Status: She is alert and oriented to person, place, and time.  Psychiatric:        Mood and Affect: Mood normal.     ED Results / Procedures / Treatments   Labs (  all labs ordered are listed, but only abnormal results are displayed) Labs Reviewed  COMPREHENSIVE METABOLIC PANEL - Abnormal; Notable for the following components:      Result Value   Sodium 134 (*)    Glucose, Bld 129 (*)    All other components within normal limits  CBC WITH DIFFERENTIAL/PLATELET    EKG None  Radiology No results found.  Procedures Procedures   Medications Ordered in ED Medications  meclizine (ANTIVERT) tablet 25 mg (25 mg Oral Given 12/27/20 1105)  sodium chloride 0.9 % bolus 500 mL (0 mLs Intravenous Stopped 12/27/20 1256)    ED Course  I have reviewed the triage vital signs and the nursing notes.  Pertinent labs & imaging results that were available during my care of the patient were reviewed by me and considered in my medical decision making (see chart for details).    MDM Rules/Calculators/A&P                          MDM:  Pt given Iv fluids and antivert.  Pt reports decreased dizziness after antivert.  Pt given rx.  Pt advised to follow up with her MD for recheck.  Final Clinical Impression(s) / ED Diagnoses Final diagnoses:  Vertigo    Rx / DC Orders ED Discharge Orders         Ordered    meclizine (ANTIVERT) 25 MG tablet  3 times daily PRN        12/27/20 1255        An After Visit Summary was printed and given to the patient.    Elson Areas, [place] 12/27/20 1301    Little, Ambrose Finland, MD 12/29/20 [phone]

## 2020-12-27 NOTE — ED Notes (Signed)
Pt states hx of vertigo with similar symptoms, has meclizine at home, but it is expired. Hasnt taken anything for this current episode. States hasnt had episode in over a year

## 2020-12-27 NOTE — ED Notes (Signed)
Pt states dizziness has improved since arrival to ED. Able to take a few steps to restroom.

## 2020-12-27 NOTE — ED Triage Notes (Signed)
Woken up from sleep with dizziness, room spinning sensation and nausea.

## 2021-03-01 ENCOUNTER — Other Ambulatory Visit: Payer: Self-pay

## 2021-03-01 ENCOUNTER — Emergency Department (HOSPITAL_BASED_OUTPATIENT_CLINIC_OR_DEPARTMENT_OTHER)
Admission: EM | Admit: 2021-03-01 | Discharge: 2021-03-01 | Disposition: A | Discharge status: Home / Self Care | Payer: 59 | Attending: Emergency Medicine | Admitting: Emergency Medicine

## 2021-03-01 ENCOUNTER — Encounter (HOSPITAL_BASED_OUTPATIENT_CLINIC_OR_DEPARTMENT_OTHER): Payer: Self-pay | Admitting: *Deleted

## 2021-03-01 DIAGNOSIS — Z7901 Long term (current) use of anticoagulants: Secondary | ICD-10-CM | POA: Diagnosis not present

## 2021-03-01 DIAGNOSIS — R112 Nausea with vomiting, unspecified: Secondary | ICD-10-CM | POA: Diagnosis present

## 2021-03-01 DIAGNOSIS — A084 Viral intestinal infection, unspecified: Secondary | ICD-10-CM | POA: Insufficient documentation

## 2021-03-01 DIAGNOSIS — Z91040 Latex allergy status: Secondary | ICD-10-CM | POA: Diagnosis not present

## 2021-03-01 DIAGNOSIS — I10 Essential (primary) hypertension: Secondary | ICD-10-CM | POA: Insufficient documentation

## 2021-03-01 DIAGNOSIS — J45909 Unspecified asthma, uncomplicated: Secondary | ICD-10-CM | POA: Diagnosis not present

## 2021-03-01 DIAGNOSIS — Z79899 Other long term (current) drug therapy: Secondary | ICD-10-CM | POA: Insufficient documentation

## 2021-03-01 DIAGNOSIS — Z8616 Personal history of COVID-19: Secondary | ICD-10-CM | POA: Diagnosis not present

## 2021-03-01 HISTORY — DX: Gout, unspecified: M10.9

## 2021-03-01 HISTORY — DX: Gastro-esophageal reflux disease without esophagitis: K21.9

## 2021-03-01 HISTORY — DX: Acute embolism and thrombosis of unspecified deep veins of unspecified lower extremity: I82.409

## 2021-03-01 HISTORY — DX: Hyperlipidemia, unspecified: E78.5

## 2021-03-01 LAB — CBC WITH DIFFERENTIAL/PLATELET
Abs Immature Granulocytes: 0.01 10*3/uL (ref 0.00–0.07)
Basophils Absolute: 0 10*3/uL (ref 0.0–0.1)
Basophils Relative: 0 %
Eosinophils Absolute: 0 10*3/uL (ref 0.0–0.5)
Eosinophils Relative: 0 %
HCT: 42.3 % (ref 36.0–46.0)
Hemoglobin: 13.9 g/dL (ref 12.0–15.0)
Immature Granulocytes: 0 %
Lymphocytes Relative: 19 %
Lymphs Abs: 0.8 10*3/uL (ref 0.7–4.0)
MCH: 31.7 pg (ref 26.0–34.0)
MCHC: 32.9 g/dL (ref 30.0–36.0)
MCV: 96.6 fL (ref 80.0–100.0)
Monocytes Absolute: 0.3 10*3/uL (ref 0.1–1.0)
Monocytes Relative: 8 %
Neutro Abs: 3.2 10*3/uL (ref 1.7–7.7)
Neutrophils Relative %: 73 %
Platelets: 259 10*3/uL (ref 150–400)
RBC: 4.38 MIL/uL (ref 3.87–5.11)
RDW: 11.3 % — ABNORMAL LOW (ref 11.5–15.5)
WBC: 4.4 10*3/uL (ref 4.0–10.5)
nRBC: 0 % (ref 0.0–0.2)

## 2021-03-01 LAB — COMPREHENSIVE METABOLIC PANEL
ALT: 22 U/L (ref 0–44)
AST: 20 U/L (ref 15–41)
Albumin: 4.1 g/dL (ref 3.5–5.0)
Alkaline Phosphatase: 76 U/L (ref 38–126)
Anion gap: 10 (ref 5–15)
BUN: 13 mg/dL (ref 6–20)
CO2: 22 mmol/L (ref 22–32)
Calcium: 9.1 mg/dL (ref 8.9–10.3)
Chloride: 106 mmol/L (ref 98–111)
Creatinine, Ser: 1.02 mg/dL — ABNORMAL HIGH (ref 0.44–1.00)
GFR, Estimated: >60 mL/min (ref >60)
Glucose, Bld: 106 mg/dL — ABNORMAL HIGH (ref 70–99)
Potassium: 3.2 mmol/L — ABNORMAL LOW (ref 3.5–5.1)
Sodium: 138 mmol/L (ref 135–145)
Total Bilirubin: 0.7 mg/dL (ref 0.3–1.2)
Total Protein: 8.4 g/dL — ABNORMAL HIGH (ref 6.5–8.1)

## 2021-03-01 MED ORDER — LACTATED RINGERS IV BOLUS
1000.0000 mL | Freq: Once | INTRAVENOUS | Status: AC
  Administered 2021-03-01: 1000 mL via INTRAVENOUS

## 2021-03-01 MED ORDER — POTASSIUM CHLORIDE CRYS ER 20 MEQ PO TBCR
40.0000 meq | EXTENDED_RELEASE_TABLET | Freq: Once | ORAL | Status: AC
  Administered 2021-03-01: 40 meq via ORAL
  Filled 2021-03-01: qty 2

## 2021-03-01 MED ORDER — ONDANSETRON 4 MG PO TBDP: 4.0000 mg | ORAL_TABLET | Freq: Three times a day (TID) | ORAL | 0 refills | Status: AC | PRN

## 2021-03-01 MED ORDER — ONDANSETRON HCL 4 MG/2ML IJ SOLN
4.0000 mg | Freq: Once | INTRAMUSCULAR | Status: AC
  Administered 2021-03-01: 4 mg via INTRAVENOUS
  Filled 2021-03-01: qty 2

## 2021-03-01 NOTE — ED Notes (Signed)
ED Provider at bedside. 

## 2021-03-01 NOTE — Discharge Instructions (Addendum)
Ms. Veronica Odom, it was a pleasure taking care of you. You have most likely caught a stomach bug. I am discharging you with a prescription for zofran to be taken as needed every 8 hours.  Please return to the ED if you have worsening symptoms, especially inability to stay hydrated. Try to get plenty of rest and as much nutrition as you can tolerate.

## 2021-03-01 NOTE — ED Triage Notes (Signed)
Nausea, vomiting and diarrhea, onset yesterday, appetite poor, has only eaten jello.

## 2021-03-01 NOTE — ED Provider Notes (Signed)
[facility] EMERGENCY DEPARTMENT Provider Note   CSN: [id number] Arrival date & time: 03/01/21  5929     History Chief Complaint  Patient presents with  . Nausea    Veronica Odom is a 45 y.o. female with hx of asthma, hypertension, HLD, DVT presents with nausea, vomiting, and diarrhea with onset at 11am yesterday. States that vomiting started at 1am overnight and has had 2 episodes of clear/yellow emesis. Has had diarrhea all day yesterday and today. Reports poor PO intake. Has associated chills and sweating, no fevers. No sick contacts or unusual ingestion. No new medications. Was in her regular state of health prior to this. Had 2 negative home COVID tests. Had COVID in January 2021 and is vaccinated.   Past Medical History:  Diagnosis Date  . Asthma   . DVT (deep venous thrombosis) (HCC)   . GERD (gastroesophageal reflux disease)   . Gout   . HLD (hyperlipidemia)   . Hypertension     Patient Active Problem List   Diagnosis Date Noted  . COVID-19 virus infection 10/13/2019  . Pneumonia due to COVID-19 virus 10/13/2019  . Pulmonary nodules 10/13/2019  . Acute pulmonary embolism (HCC) 10/12/2019  . Acute vestibular syndrome 12/29/2017  . Acute peripheral vestibulopathy, left 12/28/2017    Past Surgical History:  Procedure Laterality Date  . ABDOMINAL HYSTERECTOMY    . BREAST SURGERY    . CHOLECYSTECTOMY    . KNEE SURGERY    . TUBAL LIGATION       OB History    Gravida  4   Para  4   Term      Preterm      AB      Living        SAB      IAB      Ectopic      Multiple      Live Births              Family History  Problem Relation Age of Onset  . Heart failure Mother     Social History   Tobacco Use  . Smoking status: Never Smoker  . Smokeless tobacco: Never Used  Vaping Use  . Vaping Use: Never used  Substance Use Topics  . Alcohol use: Not Currently    Comment: occ  . Drug use: No    Home Medications Prior to  Admission medications   Medication Sig Start Date End Date Taking? Authorizing Provider  atorvastatin (LIPITOR) 10 MG tablet Take 10 mg by mouth daily.   Yes [provider]  ondansetron (ZOFRAN ODT) 4 MG disintegrating tablet Take 1 tablet (4 mg total) by mouth every 8 (eight) hours as needed for nausea or vomiting. 03/01/21  Yes Remo Lipps, MD  potassium chloride (KLOR-CON) 10 MEQ tablet Take 10 mEq by mouth 2 (two) times daily.   Yes [provider]  rivaroxaban (XARELTO) 10 MG TABS tablet Take 10 mg by mouth daily.   Yes [provider]  ADVAIR DISKUS 250-50 MCG/DOSE AEPB Inhale 1 puff into the lungs 2 (two) times daily. 05/17/19   [provider]  albuterol (VENTOLIN HFA) 108 (90 Base) MCG/ACT inhaler Inhale 1 puff into the lungs daily as needed for shortness of breath. 12/19/16   [provider]  apixaban (ELIQUIS) 5 MG TABS tablet Take 2 tablets (10 mg total) by mouth 2 (two) times daily. Patient not taking: No sig reported 10/16/19   Aquilla Hacker, MD  apixaban (ELIQUIS) 5 MG TABS tablet Take 1 tablet (5 mg total) by mouth 2 (two) times daily. Patient not taking: Reported on 12/27/2020 10/23/19   Aquilla Hacker, MD  beclomethasone (QVAR) 40 MCG/ACT inhaler Inhale 2 puffs into the lungs 2 (two) times daily. Patient not taking: Reported on 12/27/2020    [provider]  benzonatate (TESSALON) 100 MG capsule Take 1 capsule (100 mg total) by mouth every 8 (eight) hours. 10/04/19   Linwood Dibbles, MD  diclofenac Sodium (VOLTAREN) 1 % GEL Apply 4 g topically 4 (four) times daily. 07/11/20   Palumbo, April, MD  fluticasone (FLONASE) 50 MCG/ACT nasal spray Place 1 spray into both nostrils daily as needed for allergies or rhinitis.  12/13/16   [provider]  gabapentin (NEURONTIN) 300 MG capsule Take 300 mg by mouth 3 (three) times daily. 07/14/19   [provider]  guaiFENesin-dextromethorphan (ROBITUSSIN DM) 100-10 MG/5ML syrup  Take 10 mLs by mouth every 4 (four) hours as needed for cough. 10/16/19   Aquilla Hacker, MD  hydrochlorothiazide (HYDRODIURIL) 12.5 MG tablet Take 12.5 mg by mouth daily. 09/17/19   [provider]  lidocaine (LIDODERM) 5 % Place 1 patch onto the skin daily. Remove & Discard patch within 12 hours or as directed by MD 07/11/20   Palumbo, April, MD  magic mouthwash SOLN Take 5 mLs by mouth 3 (three) times daily as needed for mouth pain. Patient not taking: Reported on 10/12/2019 04/10/18   Kellie Shropshire, PA-C  meclizine (ANTIVERT) 25 MG tablet Take 1 tablet (25 mg total) by mouth 3 (three) times daily as needed for dizziness. 12/27/20   Elson Areas, PA-C  meclizine (ANTIVERT) 25 MG tablet TAKE 1 TABLET (25 MG TOTAL) BY MOUTH 3 (THREE) TIMES DAILY AS NEEDED FOR DIZZINESS. 12/27/20 12/27/21  Elson Areas, PA-C  naproxen (NAPROSYN) 375 MG tablet Take 1 tablet twice daily for ankle pain. 01/26/20   Molpus, John, MD  oxyCODONE-acetaminophen (PERCOCET) 5-325 MG tablet Take 1 tablet by mouth every 4 (four) hours as needed for severe pain. 01/26/20   Molpus, John, MD  senna-docusate (SENOKOT-S) 8.6-50 MG tablet Take 1 tablet by mouth 2 (two) times daily. 10/16/19   Aquilla Hacker, MD  tolterodine (DETROL LA) 2 MG 24 hr capsule Take 2 mg by mouth daily. 06/17/19   [provider]  topiramate (TOPAMAX) 100 MG tablet Take 100 mg by mouth at bedtime. 09/17/19   [provider]  Vitamin D, Ergocalciferol, (DRISDOL) 1.25 MG (50000 UNIT) CAPS capsule Take 50,000 Units by mouth once a week. 10/12/19   [provider]    Allergies    Shellfish-derived products, Ivp dye [iodinated diagnostic agents], Latex, and Penicillins  Review of Systems   Review of Systems  Constitutional: Positive for appetite change, chills and diaphoresis. Negative for fever.  HENT: Negative for congestion and sore throat.   Respiratory: Positive for wheezing. Negative for cough.    Cardiovascular: Negative for chest pain and palpitations.  Gastrointestinal: Positive for abdominal pain, diarrhea, nausea and vomiting. Negative for blood in stool.  Genitourinary: Negative for dysuria and frequency.  Neurological: Negative for dizziness and syncope.  All other systems reviewed and are negative.   Physical Exam Updated Vital Signs BP 120/84 (BP Location: Left Arm)   Pulse 83   Temp 99.3 F (37.4 C) (Oral)   Resp 16   Ht 5\' 5"  (1.651 m)   Wt 89.4 kg   LMP 07/05/2018   SpO2  100%   BMI 32.78 kg/m   Physical Exam Vitals and nursing note reviewed.  Constitutional:      General: She is not in acute distress.    Appearance: Normal appearance. She is not ill-appearing or toxic-appearing.  HENT:     Head: Normocephalic and atraumatic.     Right Ear: External ear normal.     Left Ear: External ear normal.     Nose: Nose normal.     Mouth/Throat:     Mouth: Mucous membranes are moist.  Eyes:     Extraocular Movements: Extraocular movements intact.  Pulmonary:     Effort: Pulmonary effort is normal.  Abdominal:     General: Abdomen is flat.     Palpations: Abdomen is soft.     Tenderness: There is abdominal tenderness (mild diffuse tenderness).  Musculoskeletal:        General: No swelling or deformity. Normal range of motion.     Cervical back: Normal range of motion and neck supple.  Skin:    General: Skin is warm and dry.  Neurological:     General: No focal deficit present.     Mental Status: She is alert and oriented to person, place, and time.  Psychiatric:        Mood and Affect: Mood normal.        Behavior: Behavior normal.     ED Results / Procedures / Treatments   Labs (all labs ordered are listed, but only abnormal results are displayed) Labs Reviewed  CBC WITH DIFFERENTIAL/PLATELET - Abnormal; Notable for the following components:      Result Value   RDW 11.3 (*)    All other components within normal limits  COMPREHENSIVE METABOLIC  PANEL - Abnormal; Notable for the following components:   Potassium 3.2 (*)    Glucose, Bld 106 (*)    Creatinine, Ser 1.02 (*)    Total Protein 8.4 (*)    All other components within normal limits   Medications Ordered in ED Medications  lactated ringers bolus 1,000 mL (0 mLs Intravenous Stopped 03/01/21 0910)  ondansetron (ZOFRAN) injection 4 mg (4 mg Intravenous Given 03/01/21 0759)  potassium chloride SA (KLOR-CON) CR tablet 40 mEq (40 mEq Oral Given 03/01/21 0908)    ED Course  I have reviewed the triage vital signs and the nursing notes.  Pertinent labs & imaging results that were available during my care of the patient were reviewed by me and considered in my medical decision making (see chart for details).  Clinical Course as of 03/01/21 0912  Thu Mar 01, 2021  0752 Pulse Rate: 97 [JC]  0753 BP: 127/81 [JC]  0753 Temp: 99.3 F (37.4 C) [JC]    Clinical Course User Index [JC] Remo Lippshen, Maysoon Lozada Y, MD   MDM Rules/Calculators/A&P                          Patient with n/v/d with acute onset yesterday. Does not appear ill, mucous membranes are moist and no skin tenting. Prior to this was in her usual state of health. Likely gastroenteritis. Will start IV fluids, zofran, and PO challenge.   Patient felt better after IV fluids and zofran. States her nausea is controlled. Supplemented potassium with klor-con tablets which she tolerated. Discharged with prescription for zofran. Suspect gastroenteritis as the cause of her symptoms.  Final Clinical Impression(s) / ED Diagnoses Final diagnoses:  Viral gastroenteritis    Rx / DC Orders ED  Discharge Orders         Ordered    ondansetron (ZOFRAN ODT) 4 MG disintegrating tablet  Every 8 hours PRN        03/01/21 0904           Remo Lipps, MD 03/01/21 0912    Clarene Duke, Ambrose Finland, MD 03/01/21 [phone]

## 2021-04-06 ENCOUNTER — Other Ambulatory Visit: Payer: Self-pay

## 2021-04-06 ENCOUNTER — Encounter (HOSPITAL_BASED_OUTPATIENT_CLINIC_OR_DEPARTMENT_OTHER): Payer: Self-pay | Admitting: Emergency Medicine

## 2021-04-06 ENCOUNTER — Emergency Department (HOSPITAL_BASED_OUTPATIENT_CLINIC_OR_DEPARTMENT_OTHER)
Admission: EM | Admit: 2021-04-06 | Discharge: 2021-04-06 | Disposition: A | Discharge status: Home / Self Care | Payer: 59 | Attending: Emergency Medicine | Admitting: Emergency Medicine

## 2021-04-06 ENCOUNTER — Other Ambulatory Visit (HOSPITAL_BASED_OUTPATIENT_CLINIC_OR_DEPARTMENT_OTHER): Payer: Self-pay

## 2021-04-06 DIAGNOSIS — Z8616 Personal history of COVID-19: Secondary | ICD-10-CM | POA: Diagnosis not present

## 2021-04-06 DIAGNOSIS — R1030 Lower abdominal pain, unspecified: Secondary | ICD-10-CM | POA: Diagnosis not present

## 2021-04-06 DIAGNOSIS — M5441 Lumbago with sciatica, right side: Secondary | ICD-10-CM | POA: Diagnosis present

## 2021-04-06 DIAGNOSIS — Z79899 Other long term (current) drug therapy: Secondary | ICD-10-CM | POA: Insufficient documentation

## 2021-04-06 DIAGNOSIS — Z91040 Latex allergy status: Secondary | ICD-10-CM | POA: Insufficient documentation

## 2021-04-06 DIAGNOSIS — Z7901 Long term (current) use of anticoagulants: Secondary | ICD-10-CM | POA: Insufficient documentation

## 2021-04-06 DIAGNOSIS — N739 Female pelvic inflammatory disease, unspecified: Secondary | ICD-10-CM | POA: Diagnosis not present

## 2021-04-06 DIAGNOSIS — J45909 Unspecified asthma, uncomplicated: Secondary | ICD-10-CM | POA: Diagnosis not present

## 2021-04-06 DIAGNOSIS — I10 Essential (primary) hypertension: Secondary | ICD-10-CM | POA: Diagnosis not present

## 2021-04-06 DIAGNOSIS — N730 Acute parametritis and pelvic cellulitis: Secondary | ICD-10-CM

## 2021-04-06 LAB — COMPREHENSIVE METABOLIC PANEL
ALT: 23 U/L (ref 0–44)
AST: 23 U/L (ref 15–41)
Albumin: 4.1 g/dL (ref 3.5–5.0)
Alkaline Phosphatase: 70 U/L (ref 38–126)
Anion gap: 7 (ref 5–15)
BUN: 15 mg/dL (ref 6–20)
CO2: 22 mmol/L (ref 22–32)
Calcium: 9.4 mg/dL (ref 8.9–10.3)
Chloride: 107 mmol/L (ref 98–111)
Creatinine, Ser: 0.93 mg/dL (ref 0.44–1.00)
GFR, Estimated: >60 mL/min (ref >60)
Glucose, Bld: 103 mg/dL — ABNORMAL HIGH (ref 70–99)
Potassium: 4 mmol/L (ref 3.5–5.1)
Sodium: 136 mmol/L (ref 135–145)
Total Bilirubin: 0.7 mg/dL (ref 0.3–1.2)
Total Protein: 8 g/dL (ref 6.5–8.1)

## 2021-04-06 LAB — CBC WITH DIFFERENTIAL/PLATELET
Abs Immature Granulocytes: 0 10*3/uL (ref 0.00–0.07)
Basophils Absolute: 0 10*3/uL (ref 0.0–0.1)
Basophils Relative: 1 %
Eosinophils Absolute: 0.1 10*3/uL (ref 0.0–0.5)
Eosinophils Relative: 2 %
HCT: 37.4 % (ref 36.0–46.0)
Hemoglobin: 12.4 g/dL (ref 12.0–15.0)
Immature Granulocytes: 0 %
Lymphocytes Relative: 41 %
Lymphs Abs: 2.4 10*3/uL (ref 0.7–4.0)
MCH: 31.7 pg (ref 26.0–34.0)
MCHC: 33.2 g/dL (ref 30.0–36.0)
MCV: 95.7 fL (ref 80.0–100.0)
Monocytes Absolute: 0.6 10*3/uL (ref 0.1–1.0)
Monocytes Relative: 10 %
Neutro Abs: 2.7 10*3/uL (ref 1.7–7.7)
Neutrophils Relative %: 46 %
Platelets: 233 10*3/uL (ref 150–400)
RBC: 3.91 MIL/uL (ref 3.87–5.11)
RDW: 11.6 % (ref 11.5–15.5)
WBC: 5.9 10*3/uL (ref 4.0–10.5)
nRBC: 0 % (ref 0.0–0.2)

## 2021-04-06 LAB — WET PREP, GENITAL
Clue Cells Wet Prep HPF POC: NONE SEEN
Sperm: NONE SEEN
Yeast Wet Prep HPF POC: NONE SEEN

## 2021-04-06 LAB — URINALYSIS, MICROSCOPIC (REFLEX): RBC / HPF: NONE SEEN RBC/hpf (ref 0–5)

## 2021-04-06 LAB — URINALYSIS, ROUTINE W REFLEX MICROSCOPIC
Bilirubin Urine: NEGATIVE
Glucose, UA: NEGATIVE mg/dL
Hgb urine dipstick: NEGATIVE
Ketones, ur: NEGATIVE mg/dL
Nitrite: NEGATIVE
Protein, ur: NEGATIVE mg/dL
Specific Gravity, Urine: >1.03 — ABNORMAL HIGH (ref 1.005–1.030)
pH: 5 (ref 5.0–8.0)

## 2021-04-06 LAB — LIPASE, BLOOD: Lipase: 41 U/L (ref 11–51)

## 2021-04-06 MED ORDER — DOXYCYCLINE HYCLATE 100 MG PO TABS
100.0000 mg | ORAL_TABLET | Freq: Once | ORAL | Status: AC
  Administered 2021-04-06: 100 mg via ORAL
  Filled 2021-04-06: qty 1

## 2021-04-06 MED ORDER — OXYCODONE-ACETAMINOPHEN 5-325 MG PO TABS
1.0000 | ORAL_TABLET | Freq: Once | ORAL | Status: AC
  Administered 2021-04-06: 1 via ORAL
  Filled 2021-04-06: qty 1

## 2021-04-06 MED ORDER — METRONIDAZOLE 500 MG PO TABS
500.0000 mg | ORAL_TABLET | Freq: Two times a day (BID) | ORAL | 0 refills | Status: AC
  Filled 2021-04-06: qty 28, 14d supply, fill #0

## 2021-04-06 MED ORDER — DOXYCYCLINE HYCLATE 100 MG PO CAPS
100.0000 mg | ORAL_CAPSULE | Freq: Two times a day (BID) | ORAL | 0 refills | Status: AC
  Filled 2021-04-06: qty 28, 14d supply, fill #0

## 2021-04-06 MED ORDER — GENTAMICIN SULFATE 40 MG/ML IJ SOLN
240.0000 mg | Freq: Once | INTRAMUSCULAR | Status: AC
  Administered 2021-04-06: 240 mg via INTRAMUSCULAR
  Filled 2021-04-06: qty 6

## 2021-04-06 MED ORDER — METRONIDAZOLE 500 MG PO TABS
2000.0000 mg | ORAL_TABLET | Freq: Once | ORAL | Status: AC
  Administered 2021-04-06: 2000 mg via ORAL
  Filled 2021-04-06: qty 4

## 2021-04-06 MED ORDER — NAPROXEN 500 MG PO TABS: 500.0000 mg | ORAL_TABLET | Freq: Two times a day (BID) | ORAL | 0 refills | Status: DC

## 2021-04-06 MED ORDER — METHOCARBAMOL 500 MG PO TABS: 500.0000 mg | ORAL_TABLET | Freq: Two times a day (BID) | ORAL | 0 refills | Status: DC

## 2021-04-06 MED ORDER — METHOCARBAMOL 500 MG PO TABS
500.0000 mg | ORAL_TABLET | Freq: Two times a day (BID) | ORAL | 0 refills | Status: AC
  Filled 2021-04-06: qty 20, 10d supply, fill #0

## 2021-04-06 MED ORDER — METHOCARBAMOL 500 MG PO TABS
500.0000 mg | ORAL_TABLET | Freq: Once | ORAL | Status: AC
  Administered 2021-04-06: 500 mg via ORAL
  Filled 2021-04-06: qty 1

## 2021-04-06 MED ORDER — METRONIDAZOLE 500 MG PO TABS: 500.0000 mg | ORAL_TABLET | Freq: Two times a day (BID) | ORAL | 0 refills | Status: DC

## 2021-04-06 MED ORDER — LIDOCAINE 5 % EX PTCH
1.0000 | MEDICATED_PATCH | CUTANEOUS | Status: DC
  Administered 2021-04-06: 1 via TRANSDERMAL
  Filled 2021-04-06: qty 1

## 2021-04-06 MED ORDER — DOXYCYCLINE HYCLATE 100 MG PO CAPS: 100.0000 mg | ORAL_CAPSULE | Freq: Two times a day (BID) | ORAL | 0 refills | Status: DC

## 2021-04-06 NOTE — ED Triage Notes (Signed)
Reports low back pain that started around 0300 that radiates down the right buttock and leg.  Has not taken anything for it.  Hx of the same.

## 2021-04-06 NOTE — ED Provider Notes (Signed)
[facility] EMERGENCY DEPARTMENT Provider Note   CSN: [id number] Arrival date & time: 04/06/21  1026     History Chief Complaint  Patient presents with   Back Pain    Veronica Odom is a 45 y.o. 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 History of chronic low back pain with right-sided sciatica with previous SI injections. Patient notes this pain feels different than previous low back pain because it radiates to bilateral low abdomen.  Denies any low back injury.  Denies any change in physical activity.  Patient denies urinary and vaginal symptoms.  Denies saddle paresthesias, bowel/bladder incontinence, lower extremity weakness, history of IV drug use, history of cancer, and fever/chills. No treatment prior to arrival. Pain is worse with movement, especially ambulation. Patient notes she has had a previous MRI in the past which demonstrates degenerative disease.   History obtained from patient and past medical records. No interpreter used during encounter.       Past Medical History:  Diagnosis Date   Asthma    DVT (deep venous thrombosis) (HCC)    GERD (gastroesophageal reflux disease)    Gout    HLD (hyperlipidemia)    Hypertension     Patient Active Problem List   Diagnosis Date Noted   COVID-19 virus infection 10/13/2019   Pneumonia due to COVID-19 virus 10/13/2019   Pulmonary nodules 10/13/2019   Acute pulmonary embolism (HCC) 10/12/2019   Acute vestibular syndrome 12/29/2017   Acute peripheral vestibulopathy, left 12/28/2017    Past Surgical History:  Procedure Laterality Date   ABDOMINAL HYSTERECTOMY     BREAST SURGERY     CHOLECYSTECTOMY     KNEE SURGERY     TUBAL LIGATION       OB History     Gravida   4   Para  4   Term      Preterm      AB      Living         SAB      IAB      Ectopic      Multiple      Live Births              Family History  Problem Relation Age of Onset   Heart failure Mother     Social History   Tobacco Use   Smoking status: Never   Smokeless tobacco: Never  Vaping Use   Vaping Use: Never used  Substance Use Topics   Alcohol use: Not Currently    Comment: occ   Drug use: No    Home Medications Prior to Admission medications   Medication Sig Start Date End Date Taking? Authorizing Provider  ADVAIR DISKUS 250-50 MCG/DOSE AEPB Inhale 1 puff into the lungs 2 (two) times daily. 05/17/19   [provider]  albuterol (VENTOLIN HFA) 108 (90 Base) MCG/ACT inhaler Inhale 1 puff into the lungs daily as needed for shortness of breath. 12/19/16   [provider]  apixaban (ELIQUIS) 5 MG TABS tablet Take 2 tablets (10 mg total) by mouth 2 (two) times daily. Patient not taking: No sig reported 10/16/19   Aquilla Hacker, MD  apixaban (ELIQUIS) 5 MG TABS tablet Take 1 tablet (5 mg total) by mouth 2 (  two) times daily. Patient not taking: Reported on 12/27/2020 10/23/19   Aquilla Hacker, MD  atorvastatin (LIPITOR) 10 MG tablet Take 10 mg by mouth daily.    [provider]  beclomethasone (QVAR) 40 MCG/ACT inhaler Inhale 2 puffs into the lungs 2 (two) times daily. Patient not taking: Reported on 12/27/2020    [provider]  benzonatate (TESSALON) 100 MG capsule Take 1 capsule (100 mg total) by mouth every 8 (eight) hours. 10/04/19   Linwood Dibbles, MD  diclofenac Sodium (VOLTAREN) 1 % GEL Apply 4 g topically 4 (four) times daily. 07/11/20   Palumbo, April, MD  doxycycline (VIBRAMYCIN) 100 MG capsule Take 1 capsule (100 mg total) by mouth 2 (two) times daily for 14 days. 04/06/21 04/20/21  Mannie Stabile, PA-C  fluticasone (FLONASE) 50 MCG/ACT nasal spray Place 1 spray into both nostrils daily as needed for allergies  or rhinitis.  12/13/16   [provider]  gabapentin (NEURONTIN) 300 MG capsule Take 300 mg by mouth 3 (three) times daily. 07/14/19   [provider]  guaiFENesin-dextromethorphan (ROBITUSSIN DM) 100-10 MG/5ML syrup Take 10 mLs by mouth every 4 (four) hours as needed for cough. 10/16/19   Aquilla Hacker, MD  hydrochlorothiazide (HYDRODIURIL) 12.5 MG tablet Take 12.5 mg by mouth daily. 09/17/19   [provider]  lidocaine (LIDODERM) 5 % Place 1 patch onto the skin daily. Remove & Discard patch within 12 hours or as directed by MD 07/11/20   Palumbo, April, MD  magic mouthwash SOLN Take 5 mLs by mouth 3 (three) times daily as needed for mouth pain. Patient not taking: Reported on 10/12/2019 04/10/18   Kellie Shropshire, PA-C  meclizine (ANTIVERT) 25 MG tablet Take 1 tablet (25 mg total) by mouth 3 (three) times daily as needed for dizziness. 12/27/20   Elson Areas, PA-C  meclizine (ANTIVERT) 25 MG tablet TAKE 1 TABLET (25 MG TOTAL) BY MOUTH 3 (THREE) TIMES DAILY AS NEEDED FOR DIZZINESS. 12/27/20 12/27/21  Elson Areas, PA-C  methocarbamol (ROBAXIN) 500 MG tablet Take 1 tablet (500 mg total) by mouth 2 (two) times daily. 04/06/21   Mannie Stabile, PA-C  metroNIDAZOLE (FLAGYL) 500 MG tablet Take 1 tablet (500 mg total) by mouth 2 (two) times daily for 14 days. 04/06/21 04/20/21  Mannie Stabile, PA-C  naproxen (NAPROSYN) 375 MG tablet Take 1 tablet twice daily for ankle pain. 01/26/20   Molpus, John, MD  ondansetron (ZOFRAN ODT) 4 MG disintegrating tablet Take 1 tablet (4 mg total) by mouth every 8 (eight) hours as needed for nausea or vomiting. 03/01/21   Remo Lipps, MD  oxyCODONE-acetaminophen (PERCOCET) 5-325 MG tablet Take 1 tablet by mouth every 4 (four) hours as needed for severe pain. 01/26/20   Molpus, John, MD  potassium chloride (KLOR-CON) 10 MEQ tablet Take 10 mEq by mouth 2 (two) times daily.    [provider]  rivaroxaban (XARELTO) 10 MG TABS  tablet Take 10 mg by mouth daily.    [provider]  senna-docusate (SENOKOT-S) 8.6-50 MG tablet Take 1 tablet by mouth 2 (two) times daily. 10/16/19   Aquilla Hacker, MD  tolterodine (DETROL LA) 2 MG 24 hr capsule Take 2 mg by mouth daily. 06/17/19   [provider]  topiramate (TOPAMAX) 100 MG tablet Take 100 mg by mouth at bedtime. 09/17/19   [provider]  Vitamin D, Ergocalciferol, (DRISDOL) 1.25 MG (50000 UNIT) CAPS capsule Take 50,000 Units by mouth  once a week. 10/12/19   [provider]    Allergies    Shellfish-derived products, Ivp dye [iodinated diagnostic agents], Latex, and Penicillins  Review of Systems   Review of Systems  Constitutional:  Negative for chills and fever.  Respiratory:  Negative for shortness of breath.   Cardiovascular:  Negative for chest pain.  Gastrointestinal:  Positive for abdominal pain. Negative for diarrhea, nausea and vomiting.  Genitourinary:  Negative for dysuria and vaginal discharge.  Musculoskeletal:  Positive for back pain.  All other systems reviewed and are negative.  Physical Exam Updated Vital Signs BP 128/86 (BP Location: Left Arm)   Pulse 76   Temp 98.3 F (36.8 C) (Oral)   Resp 16   Ht  (1.651 m)   Wt 84.8 kg   LMP 07/05/2018   SpO2 96%   BMI 31.12 kg/m   Physical Exam Vitals and nursing note reviewed.  Constitutional:      General: She is not in acute distress.    Appearance: She is not ill-appearing.  HENT:     Head: Normocephalic.  Eyes:     Pupils: Pupils are equal, round, and reactive to light.  Cardiovascular:     Rate and Rhythm: Normal rate and regular rhythm.     Pulses: Normal pulses.     Heart sounds: Normal heart sounds. No murmur heard.   No friction rub. No gallop.  Pulmonary:     Effort: Pulmonary effort is normal.     Breath sounds: Normal breath sounds.  Abdominal:     General: Abdomen is flat. There is no distension.     Palpations: Abdomen is soft.      Tenderness: There is abdominal tenderness. There is no guarding or rebound.     Comments: Bilateral lower abdominal tenderness without rebound or guarding.   Genitourinary:    Comments: GU exam performed with chaperone in room. Copious amount of white discharge. Significant tenderness with speculum and bimanual exam. No masses.  Musculoskeletal:        General: Normal range of motion.     Cervical back: Neck supple.     Comments: No thoracic or lumbar midline tenderness. Reproducible right-sided lumbar paraspinal tenderness. Bilateral lower extremities neurovascularly intact.   Skin:    General: Skin is warm and dry.  Neurological:     General: No focal deficit present.     Mental Status: She is alert.  Psychiatric:        Mood and Affect: Mood normal.        Behavior: Behavior normal.    ED Results / Procedures / Treatments   Labs (all labs ordered are listed, but only abnormal results are displayed) Labs Reviewed  WET PREP, GENITAL - Abnormal; Notable for the following components:      Result Value   Trich, Wet Prep PRESENT (*)    WBC, Wet Prep HPF POC MANY (*)    All other components within normal limits  COMPREHENSIVE METABOLIC PANEL - Abnormal; Notable for the following components:   Glucose, Bld 103 (*)    All other components within normal limits  URINALYSIS, ROUTINE W REFLEX MICROSCOPIC - Abnormal; Notable for the following components:   Specific Gravity, Urine >1.030 (*)    Leukocytes,Ua TRACE (*)    All other components within normal limits  URINALYSIS, MICROSCOPIC (REFLEX) - Abnormal; Notable for the following components:   Bacteria, UA MANY (*)    Trichomonas, UA PRESENT (*)    All other  components within normal limits  CBC WITH DIFFERENTIAL/PLATELET  LIPASE, BLOOD  GC/CHLAMYDIA PROBE AMP ([place]) NOT AT [facility]    EKG None  Radiology No results found.  Procedures Procedures   Medications Ordered in ED Medications  lidocaine (LIDODERM) 5 % 1  patch (1 patch Transdermal Patch Applied 04/06/21 1142)  oxyCODONE-acetaminophen (PERCOCET/ROXICET) 5-325 MG per tablet 1 tablet (1 tablet Oral Given 04/06/21 1142)  methocarbamol (ROBAXIN) tablet 500 mg (500 mg Oral Given 04/06/21 1142)  gentamicin (GARAMYCIN) injection 240 mg (240 mg Intramuscular Given 04/06/21 1433)  doxycycline (VIBRA-TABS) tablet 100 mg (100 mg Oral Given 04/06/21 1431)  metroNIDAZOLE (FLAGYL) tablet 2,000 mg (2,000 mg Oral Given 04/06/21 1430)    ED Course  I have reviewed the triage vital signs and the nursing notes.  Pertinent labs & imaging results that were available during my care of the patient were reviewed by me and considered in my medical decision making (see chart for details).  Clinical Course as of 04/06/21 1610  Fri Apr 06, 2021  1308 Trichomonas, UA(!): PRESENT [CA]  1439 Trich, Wet Prep(!): PRESENT [CA]  1439 WBC, Wet Prep HPF POC(!): MANY [CA]    Clinical Course User Index [CA] Mannie StabileAberman, Phelps Cohick C, PA-C   MDM Rules/Calculators/A&P                         45 year old female presents to the ED due to acute on chronic low back pain that radiates to bilateral lower abdomen that started early this morning.  Denies saddle paresthesias, bowel/bladder incontinence, lower extremity weakness, IV drug use, fever/chills, and history of cancer.  She admits to intermittent numbness/tingling to right lower extremity.  She has had previous SI injections for low back pain.  No change in activity.  Upon arrival, patient afebrile, not tachycardic or hypoxic.  Patient nontoxic-appearing.  Physical exam reassuring.  No thoracic or lumbar midline tenderness.  Reproducible right-sided lumbar paraspinal tenderness.  Bilateral lower extremities neurovascularly intact.  Low suspicion for cauda equina or central cord compression.  Abdomen soft, nondistended with bilateral lower abdominal tenderness without rebound or guarding.  Low suspicion for appendicitis vs. Diverticulitis vs other  emergent intra-abdominal etiologies. Given abdominal pain, routine labs ordered. Percocet and Robaxin given for symptomatic treatment.   CBC unremarkable with no leukocytosis and normal hemoglobin.  CMP with mild hyperglycemia at 103.  Normal renal function.  No major electrolyte derangements.  Lipase normal at 41.  UA significant for trace leukocytes and many bacteria.  Trichomonas present in urine. Given pelvic pain and positive trichomonas, will perform pelvic and obtain wet prep and gonorrhea/chlamydia to rule out PID.  GU exam performed with chaperone in room.  Patient had significant tenderness with speculum and bimanual exam.  No masses appreciated on exam.  Copious amount of white discharge.  Given significant tenderness, will treat for PID.  Given normal vitals and no leukocytosis low suspicion for abscess, so will hold off on US.  Discussed case with Dr. Renaye Rakersrifan who agrees with assessment and plan. Patient discharged with treatment for PID. Advised patient to follow-up with OBGYN within the next week. Patient discharged with muscle relaxer for low back pain and neurosurgery consult. Strict ED precautions discussed with patient. Patient states understanding and agrees to plan. Patient discharged home in no acute distress and stable vitals.  Final Clinical Impression(s) / ED Diagnoses Final diagnoses:  Acute right-sided low back pain with right-sided sciatica  PID (acute pelvic inflammatory disease)  Rx / DC Orders ED Discharge Orders          Ordered    metroNIDAZOLE (FLAGYL) 500 MG tablet  2 times daily,   Status:  Discontinued        04/06/21 1416    doxycycline (VIBRAMYCIN) 100 MG capsule  2 times daily,   Status:  Discontinued        04/06/21 1416    naproxen (NAPROSYN) 500 MG tablet  2 times daily,   Status:  Discontinued        04/06/21 1416    methocarbamol (ROBAXIN) 500 MG tablet  2 times daily,   Status:  Discontinued        04/06/21 1416    doxycycline (VIBRAMYCIN) 100 MG  capsule  2 times daily        04/06/21 1501    methocarbamol (ROBAXIN) 500 MG tablet  2 times daily        04/06/21 1501    metroNIDAZOLE (FLAGYL) 500 MG tablet  2 times daily        04/06/21 1501             Jesusita Oka 04/06/21 1610    Terald Sleeper, MD 04/06/21 1724

## 2021-04-06 NOTE — Discharge Instructions (Addendum)
It was a pleasure taking care of you today. As discussed, your wet prep showed trichomonas which is an STI. Given how tender you were on your pelvic exam, I am treating you for pelvic inflammatory disease. You will take antibiotics for 14 days. Do not drink alcohol while on the medication. I am also sending you home with nausea medication. Take as needed. Follow-up with OBGYN within the next week for further evaluation.  I am sending you home with a muscle relaxer for your low back pain.  Take as needed.  Muscle relaxer can cause drowsiness so do not drive or operate machinery while on the medication.  You may also purchase over-the-counter Lidoderm patches and Voltaren gel for added pain relief.  I have included the number of the neurosurgeon.  Please call to schedule point for further evaluation of your low back pain.  Return to the ER for new or worsening symptoms.

## 2021-04-09 LAB — GC/CHLAMYDIA PROBE AMP (~~LOC~~) NOT AT ARMC
Chlamydia: NEGATIVE
Comment: NEGATIVE
Comment: NORMAL
Neisseria Gonorrhea: NEGATIVE

## 2021-08-27 ENCOUNTER — Emergency Department (HOSPITAL_BASED_OUTPATIENT_CLINIC_OR_DEPARTMENT_OTHER)
Admission: EM | Admit: 2021-08-27 | Discharge: 2021-08-27 | Disposition: A | Discharge status: Home / Self Care | Payer: 59 | Attending: Emergency Medicine | Admitting: Emergency Medicine

## 2021-08-27 DIAGNOSIS — Z7901 Long term (current) use of anticoagulants: Secondary | ICD-10-CM | POA: Diagnosis not present

## 2021-08-27 DIAGNOSIS — Z7982 Long term (current) use of aspirin: Secondary | ICD-10-CM | POA: Insufficient documentation

## 2021-08-27 DIAGNOSIS — R000 Tachycardia, unspecified: Secondary | ICD-10-CM | POA: Insufficient documentation

## 2021-08-27 DIAGNOSIS — J101 Influenza due to other identified influenza virus with other respiratory manifestations: Secondary | ICD-10-CM | POA: Insufficient documentation

## 2021-08-27 DIAGNOSIS — Z20822 Contact with and (suspected) exposure to covid-19: Secondary | ICD-10-CM | POA: Insufficient documentation

## 2021-08-27 DIAGNOSIS — I10 Essential (primary) hypertension: Secondary | ICD-10-CM | POA: Diagnosis not present

## 2021-08-27 DIAGNOSIS — Z79899 Other long term (current) drug therapy: Secondary | ICD-10-CM | POA: Insufficient documentation

## 2021-08-27 DIAGNOSIS — R059 Cough, unspecified: Secondary | ICD-10-CM | POA: Diagnosis present

## 2021-08-27 DIAGNOSIS — Z7951 Long term (current) use of inhaled steroids: Secondary | ICD-10-CM | POA: Diagnosis not present

## 2021-08-27 DIAGNOSIS — Z8616 Personal history of COVID-19: Secondary | ICD-10-CM | POA: Diagnosis not present

## 2021-08-27 DIAGNOSIS — Z91040 Latex allergy status: Secondary | ICD-10-CM | POA: Insufficient documentation

## 2021-08-27 DIAGNOSIS — J45909 Unspecified asthma, uncomplicated: Secondary | ICD-10-CM | POA: Insufficient documentation

## 2021-08-27 LAB — URINALYSIS, ROUTINE W REFLEX MICROSCOPIC
Bilirubin Urine: NEGATIVE
Glucose, UA: NEGATIVE mg/dL
Hgb urine dipstick: NEGATIVE
Ketones, ur: NEGATIVE mg/dL
Leukocytes,Ua: NEGATIVE
Nitrite: NEGATIVE
Protein, ur: NEGATIVE mg/dL
Specific Gravity, Urine: 1.02 (ref 1.005–1.030)
pH: 6.5 (ref 5.0–8.0)

## 2021-08-27 LAB — CBC WITH DIFFERENTIAL/PLATELET
Abs Immature Granulocytes: 0.01 10*3/uL (ref 0.00–0.07)
Basophils Absolute: 0 10*3/uL (ref 0.0–0.1)
Basophils Relative: 0 %
Eosinophils Absolute: 0 10*3/uL (ref 0.0–0.5)
Eosinophils Relative: 1 %
HCT: 34.7 % — ABNORMAL LOW (ref 36.0–46.0)
Hemoglobin: 11.6 g/dL — ABNORMAL LOW (ref 12.0–15.0)
Immature Granulocytes: 0 %
Lymphocytes Relative: 16 %
Lymphs Abs: 0.8 10*3/uL (ref 0.7–4.0)
MCH: 31.8 pg (ref 26.0–34.0)
MCHC: 33.4 g/dL (ref 30.0–36.0)
MCV: 95.1 fL (ref 80.0–100.0)
Monocytes Absolute: 0.8 10*3/uL (ref 0.1–1.0)
Monocytes Relative: 16 %
Neutro Abs: 3.3 10*3/uL (ref 1.7–7.7)
Neutrophils Relative %: 67 %
Platelets: 210 10*3/uL (ref 150–400)
RBC: 3.65 MIL/uL — ABNORMAL LOW (ref 3.87–5.11)
RDW: 11.4 % — ABNORMAL LOW (ref 11.5–15.5)
WBC: 5 10*3/uL (ref 4.0–10.5)
nRBC: 0 % (ref 0.0–0.2)

## 2021-08-27 LAB — RESP PANEL BY RT-PCR (FLU A&B, COVID) ARPGX2
Influenza A by PCR: POSITIVE — AB
Influenza B by PCR: NEGATIVE
SARS Coronavirus 2 by RT PCR: NEGATIVE

## 2021-08-27 LAB — COMPREHENSIVE METABOLIC PANEL
ALT: 23 U/L (ref 0–44)
AST: 27 U/L (ref 15–41)
Albumin: 3.6 g/dL (ref 3.5–5.0)
Alkaline Phosphatase: 61 U/L (ref 38–126)
Anion gap: 8 (ref 5–15)
BUN: 12 mg/dL (ref 6–20)
CO2: 23 mmol/L (ref 22–32)
Calcium: 8.8 mg/dL — ABNORMAL LOW (ref 8.9–10.3)
Chloride: 101 mmol/L (ref 98–111)
Creatinine, Ser: 0.88 mg/dL (ref 0.44–1.00)
GFR, Estimated: >60 mL/min (ref >60)
Glucose, Bld: 106 mg/dL — ABNORMAL HIGH (ref 70–99)
Potassium: 3.6 mmol/L (ref 3.5–5.1)
Sodium: 132 mmol/L — ABNORMAL LOW (ref 135–145)
Total Bilirubin: 0.5 mg/dL (ref 0.3–1.2)
Total Protein: 7.2 g/dL (ref 6.5–8.1)

## 2021-08-27 LAB — MAGNESIUM: Magnesium: 1.5 mg/dL — ABNORMAL LOW (ref 1.7–2.4)

## 2021-08-27 MED ORDER — MAGNESIUM OXIDE -MG SUPPLEMENT 400 (240 MG) MG PO TABS
800.0000 mg | ORAL_TABLET | Freq: Once | ORAL | Status: AC
  Administered 2021-08-27: 10:00:00 800 mg via ORAL
  Filled 2021-08-27: qty 2

## 2021-08-27 MED ORDER — DIPHENHYDRAMINE HCL 50 MG/ML IJ SOLN
25.0000 mg | Freq: Once | INTRAMUSCULAR | Status: AC
  Administered 2021-08-27: 08:00:00 25 mg via INTRAVENOUS
  Filled 2021-08-27: qty 1

## 2021-08-27 MED ORDER — LACTATED RINGERS IV BOLUS
1000.0000 mL | Freq: Once | INTRAVENOUS | Status: AC
  Administered 2021-08-27: 08:00:00 1000 mL via INTRAVENOUS

## 2021-08-27 MED ORDER — OSELTAMIVIR PHOSPHATE 75 MG PO CAPS: 75.0000 mg | ORAL_CAPSULE | Freq: Two times a day (BID) | ORAL | 0 refills | Status: AC

## 2021-08-27 MED ORDER — METOCLOPRAMIDE HCL 5 MG/ML IJ SOLN
10.0000 mg | Freq: Once | INTRAMUSCULAR | Status: AC
  Administered 2021-08-27: 08:00:00 10 mg via INTRAVENOUS
  Filled 2021-08-27: qty 2

## 2021-08-27 MED ORDER — ACETAMINOPHEN 325 MG PO TABS
650.0000 mg | ORAL_TABLET | Freq: Once | ORAL | Status: AC | PRN
  Administered 2021-08-27: 08:00:00 650 mg via ORAL
  Filled 2021-08-27: qty 2

## 2021-08-27 MED ORDER — KETOROLAC TROMETHAMINE 15 MG/ML IJ SOLN
15.0000 mg | Freq: Once | INTRAMUSCULAR | Status: AC
  Administered 2021-08-27: 08:00:00 15 mg via INTRAVENOUS
  Filled 2021-08-27: qty 1

## 2021-08-27 NOTE — ED Provider Notes (Signed)
[facility] Provider Note   CSN: [id number] Arrival date & time: 08/27/21  [id number]     History Chief Complaint  Patient presents with   Cough    Veronica Odom is a 45 y.o. female.  HPI Patient presents for flulike symptoms.  Medical history notable for HTN, GERD, DVT, asthma.  She was seen in clinic 3 weeks ago for frontal headache, postnasal drainage, itchy eyes, and cough.  Since that time, her symptoms did resolve.  This morning, at approximately 3 AM, she woke up with chills, myalgias, headache, and fatigue.  The symptoms have persisted throughout the morning.  She has not taken anything at home for symptomatic relief.  Currently, headache is frontal and 10/10 in severity.  She does report a history of migraines.  She has a history of asthma and does state that she is chronically short of breath.  She denies any worsening of her shortness of breath.  Myalgias are diffuse.  She also endorses arthralgias.  Last night, going to bed, she was in her normal state of health.  She denies any known sick contacts.    Past Medical History:  Diagnosis Date   Asthma    DVT (deep venous thrombosis) (HCC)    GERD (gastroesophageal reflux disease)    Gout    HLD (hyperlipidemia)    Hypertension     Patient Active Problem List   Diagnosis Date Noted   COVID-19 virus infection 10/13/2019   Pneumonia due to COVID-19 virus 10/13/2019   Pulmonary nodules 10/13/2019   Acute pulmonary embolism ([place]) 10/12/2019   Acute vestibular syndrome 12/29/2017   Acute peripheral vestibulopathy, left 12/28/2017    Past Surgical History:  Procedure Laterality Date   ABDOMINAL HYSTERECTOMY     BREAST SURGERY     CHOLECYSTECTOMY     KNEE SURGERY     TUBAL LIGATION       OB History     Gravida  4   Para  4   Term      Preterm      AB      Living         SAB      IAB      Ectopic      Multiple      Live Births              Family History   Problem Relation Age of Onset   Heart failure Mother     Social History   Tobacco Use   Smoking status: Never   Smokeless tobacco: Never  Vaping Use   Vaping Use: Never used  Substance Use Topics   Alcohol use: Not Currently    Comment: occ   Drug use: No    Home Medications Prior to Admission medications   Medication Sig Start Date End Date Taking? Authorizing Provider  ADVAIR DISKUS 250-50 MCG/DOSE AEPB Inhale 1 puff into the lungs 2 (two) times daily. 05/17/19  Yes [provider]  albuterol (VENTOLIN HFA) 108 (90 Base) MCG/ACT inhaler Inhale 1 puff into the lungs daily as needed for shortness of breath. 12/19/16  Yes [provider]  aspirin 81 MG chewable tablet Chew by mouth daily.   Yes [provider]  atorvastatin (LIPITOR) 10 MG tablet Take 10 mg by mouth daily.   Yes [provider]  fluticasone (FLONASE) 50 MCG/ACT nasal spray Place 1 spray into both nostrils daily as needed for allergies or rhinitis.  12/13/16  Yes [provider]  hydrochlorothiazide (HYDRODIURIL) 12.5 MG tablet Take 12.5 mg by mouth daily. 09/17/19  Yes [provider]  lidocaine (LIDODERM) 5 % Place 1 patch onto the skin daily. Remove & Discard patch within 12 hours or as directed by MD 07/11/20  Yes Palumbo, April, MD  meclizine (ANTIVERT) 25 MG tablet TAKE 1 TABLET (25 MG TOTAL) BY MOUTH 3 (THREE) TIMES DAILY AS NEEDED FOR DIZZINESS. 12/27/20 12/27/21 Yes Elson AreasSofia, Leslie K, PA-C  oseltamivir (TAMIFLU) 75 MG capsule Take 1 capsule (75 mg total) by mouth every 12 (twelve) hours. 08/27/21  Yes Gloris Manchesterixon, Teeghan Hammer, MD  potassium chloride (KLOR-CON) 10 MEQ tablet Take 10 mEq by mouth 2 (two) times daily.   Yes [provider]  senna-docusate (SENOKOT-S) 8.6-50 MG tablet Take 1 tablet by mouth 2 (two) times daily. 10/16/19  Yes Aquilla HackerAjuonuma, Felix O, MD  tolterodine (DETROL LA) 2 MG 24 hr capsule Take 2 mg by mouth daily. 06/17/19  Yes [provider]   topiramate (TOPAMAX) 100 MG tablet Take 100 mg by mouth at bedtime. 09/17/19  Yes [provider]  Vitamin D, Ergocalciferol, (DRISDOL) 1.25 MG (50000 UNIT) CAPS capsule Take 50,000 Units by mouth once a week. 10/12/19  Yes [provider]  apixaban (ELIQUIS) 5 MG TABS tablet Take 2 tablets (10 mg total) by mouth 2 (two) times daily. Patient not taking: Reported on 12/27/2020 10/16/19   Aquilla HackerAjuonuma, Felix O, MD  apixaban (ELIQUIS) 5 MG TABS tablet Take 1 tablet (5 mg total) by mouth 2 (two) times daily. Patient not taking: Reported on 12/27/2020 10/23/19   Aquilla HackerAjuonuma, Felix O, MD  beclomethasone (QVAR) 40 MCG/ACT inhaler Inhale 2 puffs into the lungs 2 (two) times daily. Patient not taking: Reported on 12/27/2020    [provider]  benzonatate (TESSALON) 100 MG capsule Take 1 capsule (100 mg total) by mouth every 8 (eight) hours. 10/04/19   Linwood DibblesKnapp, Jon, MD  diclofenac Sodium (VOLTAREN) 1 % GEL Apply 4 g topically 4 (four) times daily. 07/11/20   Palumbo, April, MD  gabapentin (NEURONTIN) 300 MG capsule Take 300 mg by mouth 3 (three) times daily. 07/14/19   [provider]  guaiFENesin-dextromethorphan (ROBITUSSIN DM) 100-10 MG/5ML syrup Take 10 mLs by mouth every 4 (four) hours as needed for cough. 10/16/19   Aquilla HackerAjuonuma, Felix O, MD  magic mouthwash SOLN Take 5 mLs by mouth 3 (three) times daily as needed for mouth pain. Patient not taking: Reported on 10/12/2019 04/10/18   Kellie ShropshireShrosbree, Emily J, PA-C  meclizine (ANTIVERT) 25 MG tablet Take 1 tablet (25 mg total) by mouth 3 (three) times daily as needed for dizziness. 12/27/20   Elson AreasSofia, Leslie K, PA-C  methocarbamol (ROBAXIN) 500 MG tablet Take 1 tablet (500 mg total) by mouth 2 (two) times daily. 04/06/21   Mannie StabileAberman, Caroline C, PA-C  naproxen (NAPROSYN) 375 MG tablet Take 1 tablet twice daily for ankle pain. 01/26/20   Molpus, John, MD  ondansetron (ZOFRAN ODT) 4 MG disintegrating tablet Take 1 tablet (4 mg total) by mouth every 8  (eight) hours as needed for nausea or vomiting. 03/01/21   Remo Lippshen, Joshua Y, MD  oxyCODONE-acetaminophen (PERCOCET) 5-325 MG tablet Take 1 tablet by mouth every 4 (four) hours as needed for severe pain. 01/26/20   Molpus, John, MD  rivaroxaban (XARELTO) 10 MG TABS tablet Take 10 mg by mouth daily.    [provider]    Allergies    Shellfish-derived products, Ivp dye [iodinated diagnostic agents], Latex, and  Penicillins  Review of Systems   Review of Systems  Constitutional:  Positive for chills and fever.  HENT:  Positive for congestion, ear pain, sinus pain and sore throat. Negative for facial swelling, hearing loss, trouble swallowing and voice change.   Eyes:  Positive for photophobia and pain. Negative for visual disturbance.  Respiratory:  Positive for cough. Negative for chest tightness, shortness of breath and wheezing.   Cardiovascular:  Negative for chest pain, palpitations and leg swelling.  Gastrointestinal:  Positive for nausea. Negative for abdominal distention, abdominal pain, diarrhea and vomiting.  Genitourinary:  Negative for dysuria, flank pain, hematuria and pelvic pain.  Musculoskeletal:  Positive for arthralgias, back pain and myalgias. Negative for neck pain and neck stiffness.  Skin:  Negative for color change and rash.  Neurological:  Positive for headaches. Negative for seizures, syncope, facial asymmetry, speech difficulty, weakness and numbness.  Hematological:  Does not bruise/bleed easily.  Psychiatric/Behavioral:  Negative for confusion and decreased concentration.   All other systems reviewed and are negative.  Physical Exam Updated Vital Signs BP 108/71   Pulse 94   Temp 98.6 F (37 C) (Oral)   Resp 16   LMP 07/05/2018   SpO2 94%   Physical Exam Constitutional:      General: She is not in acute distress.    Appearance: Normal appearance. She is not ill-appearing, toxic-appearing or diaphoretic.  HENT:     Head: Normocephalic.     Right Ear:  Tympanic membrane, ear canal and external ear normal.     Left Ear: Tympanic membrane, ear canal and external ear normal.     Nose: Congestion present.     Mouth/Throat:     Mouth: Mucous membranes are moist.     Pharynx: Oropharynx is clear. No oropharyngeal exudate or posterior oropharyngeal erythema.  Eyes:     General: No scleral icterus.    Extraocular Movements: Extraocular movements intact.     Conjunctiva/sclera: Conjunctivae normal.  Cardiovascular:     Rate and Rhythm: Regular rhythm. Tachycardia present.     Heart sounds: No murmur heard. Pulmonary:     Effort: Pulmonary effort is normal. No respiratory distress.     Breath sounds: Normal breath sounds. No wheezing or rales.  Abdominal:     General: Abdomen is flat.     Palpations: Abdomen is soft.     Tenderness: There is no abdominal tenderness.  Musculoskeletal:        General: Tenderness (diffuse) present. No swelling, deformity or signs of injury. Normal range of motion.     Cervical back: Normal range of motion and neck supple. No rigidity.     Right lower leg: No edema.     Left lower leg: No edema.  Skin:    General: Skin is warm and dry.     Coloration: Skin is not jaundiced or pale.  Neurological:     General: No focal deficit present.     Mental Status: She is alert and oriented to person, place, and time.     Cranial Nerves: No cranial nerve deficit.     Sensory: No sensory deficit.     Motor: No weakness.  Psychiatric:        Mood and Affect: Mood normal.        Behavior: Behavior normal.    ED Results / Procedures / Treatments   Labs (all labs ordered are listed, but only abnormal results are displayed) Labs Reviewed  RESP PANEL BY RT-PCR (FLU A&B,  COVID) ARPGX2 - Abnormal; Notable for the following components:      Result Value   Influenza A by PCR POSITIVE (*)    All other components within normal limits  CBC WITH DIFFERENTIAL/PLATELET - Abnormal; Notable for the following components:   RBC  3.65 (*)    Hemoglobin 11.6 (*)    HCT 34.7 (*)    RDW 11.4 (*)    All other components within normal limits  COMPREHENSIVE METABOLIC PANEL - Abnormal; Notable for the following components:   Sodium 132 (*)    Glucose, Bld 106 (*)    Calcium 8.8 (*)    All other components within normal limits  MAGNESIUM - Abnormal; Notable for the following components:   Magnesium 1.5 (*)    All other components within normal limits  URINALYSIS, ROUTINE W REFLEX MICROSCOPIC    EKG None  Radiology No results found.  Procedures Procedures   Medications Ordered in ED Medications  acetaminophen (TYLENOL) tablet 650 mg (650 mg Oral Given 08/27/21 0732)  lactated ringers bolus 1,000 mL (0 mLs Intravenous Stopped 08/27/21 0918)  ketorolac (TORADOL) 15 MG/ML injection 15 mg (15 mg Intravenous Given 08/27/21 0816)  metoCLOPramide (REGLAN) injection 10 mg (10 mg Intravenous Given 08/27/21 0816)  diphenhydrAMINE (BENADRYL) injection 25 mg (25 mg Intravenous Given 08/27/21 0816)  magnesium oxide (MAG-OX) tablet 800 mg (800 mg Oral Given 08/27/21 1013)    ED Course  I have reviewed the triage vital signs and the nursing notes.  Pertinent labs & imaging results that were available during my care of the patient were reviewed by me and considered in my medical decision making (see chart for details).    MDM Rules/Calculators/A&P                          Patient is a 45 year old female presenting for flulike symptoms.  Onset of symptoms was 3 AM.  On arrival, she is febrile to 1 to 2.4 degrees.  She is tachycardic but blood pressure is normal.  She was given Tylenol for antipyresis.  On assessment, patient is well-appearing.  Breathing is even and unlabored.  Lungs are clear to auscultation.  Lab work-up and IV fluids were ordered.  Additionally, headache cocktail given for symptomatic relief.  On reassessment, patient reported significant improvement in symptoms.  Work-up shows positivity for influenza  A.  Magnesium was also low and this was replaced in the ED I discussed risks and benefits of Tamiflu.  Patient is agreeable to Tamiflu therapy.  This was prescribed.  Patient was discharged in stable condition.  Final Clinical Impression(s) / ED Diagnoses Final diagnoses:  Influenza A    Rx / DC Orders ED Discharge Orders          Ordered    oseltamivir (TAMIFLU) 75 MG capsule  Every 12 hours        08/27/21 1004             Godfrey Pick, MD 08/28/21 [phone]

## 2021-08-27 NOTE — ED Notes (Signed)
ED Provider at bedside. 

## 2021-08-27 NOTE — ED Triage Notes (Signed)
Pt reports cough, body aches, headache, and sore throat that started this am.

## 2021-09-05 ENCOUNTER — Other Ambulatory Visit: Payer: Self-pay

## 2021-09-05 ENCOUNTER — Encounter (HOSPITAL_BASED_OUTPATIENT_CLINIC_OR_DEPARTMENT_OTHER): Payer: Self-pay | Admitting: *Deleted

## 2021-09-05 ENCOUNTER — Emergency Department (HOSPITAL_BASED_OUTPATIENT_CLINIC_OR_DEPARTMENT_OTHER): Payer: 59

## 2021-09-05 ENCOUNTER — Emergency Department (HOSPITAL_BASED_OUTPATIENT_CLINIC_OR_DEPARTMENT_OTHER)
Admission: EM | Admit: 2021-09-05 | Discharge: 2021-09-06 | Disposition: A | Discharge status: Home / Self Care | Payer: 59 | Attending: Emergency Medicine | Admitting: Emergency Medicine

## 2021-09-05 DIAGNOSIS — J181 Lobar pneumonia, unspecified organism: Secondary | ICD-10-CM | POA: Insufficient documentation

## 2021-09-05 DIAGNOSIS — R000 Tachycardia, unspecified: Secondary | ICD-10-CM | POA: Diagnosis not present

## 2021-09-05 DIAGNOSIS — D72829 Elevated white blood cell count, unspecified: Secondary | ICD-10-CM | POA: Diagnosis not present

## 2021-09-05 DIAGNOSIS — Z7951 Long term (current) use of inhaled steroids: Secondary | ICD-10-CM | POA: Diagnosis not present

## 2021-09-05 DIAGNOSIS — M79605 Pain in left leg: Secondary | ICD-10-CM | POA: Insufficient documentation

## 2021-09-05 DIAGNOSIS — J45909 Unspecified asthma, uncomplicated: Secondary | ICD-10-CM | POA: Diagnosis not present

## 2021-09-05 DIAGNOSIS — Z7901 Long term (current) use of anticoagulants: Secondary | ICD-10-CM | POA: Insufficient documentation

## 2021-09-05 DIAGNOSIS — J189 Pneumonia, unspecified organism: Secondary | ICD-10-CM

## 2021-09-05 DIAGNOSIS — M79604 Pain in right leg: Secondary | ICD-10-CM | POA: Insufficient documentation

## 2021-09-05 DIAGNOSIS — G8929 Other chronic pain: Secondary | ICD-10-CM | POA: Diagnosis not present

## 2021-09-05 DIAGNOSIS — I10 Essential (primary) hypertension: Secondary | ICD-10-CM | POA: Insufficient documentation

## 2021-09-05 DIAGNOSIS — R0602 Shortness of breath: Secondary | ICD-10-CM | POA: Diagnosis present

## 2021-09-05 DIAGNOSIS — Z79899 Other long term (current) drug therapy: Secondary | ICD-10-CM | POA: Diagnosis not present

## 2021-09-05 DIAGNOSIS — Z8616 Personal history of COVID-19: Secondary | ICD-10-CM | POA: Diagnosis not present

## 2021-09-05 DIAGNOSIS — J101 Influenza due to other identified influenza virus with other respiratory manifestations: Secondary | ICD-10-CM | POA: Insufficient documentation

## 2021-09-05 DIAGNOSIS — Z91040 Latex allergy status: Secondary | ICD-10-CM | POA: Diagnosis not present

## 2021-09-05 DIAGNOSIS — Z7982 Long term (current) use of aspirin: Secondary | ICD-10-CM | POA: Insufficient documentation

## 2021-09-05 LAB — BASIC METABOLIC PANEL
Anion gap: 11 (ref 5–15)
BUN: 12 mg/dL (ref 6–20)
CO2: 22 mmol/L (ref 22–32)
Calcium: 9.5 mg/dL (ref 8.9–10.3)
Chloride: 103 mmol/L (ref 98–111)
Creatinine, Ser: 0.83 mg/dL (ref 0.44–1.00)
GFR, Estimated: >60 mL/min (ref >60)
Glucose, Bld: 116 mg/dL — ABNORMAL HIGH (ref 70–99)
Potassium: 3.8 mmol/L (ref 3.5–5.1)
Sodium: 136 mmol/L (ref 135–145)

## 2021-09-05 LAB — PROTIME-INR
INR: 1 (ref 0.8–1.2)
Prothrombin Time: 13.2 seconds (ref 11.4–15.2)

## 2021-09-05 LAB — CBC
HCT: 36.2 % (ref 36.0–46.0)
Hemoglobin: 12 g/dL (ref 12.0–15.0)
MCH: 31.1 pg (ref 26.0–34.0)
MCHC: 33.1 g/dL (ref 30.0–36.0)
MCV: 93.8 fL (ref 80.0–100.0)
Platelets: 311 10*3/uL (ref 150–400)
RBC: 3.86 MIL/uL — ABNORMAL LOW (ref 3.87–5.11)
RDW: 11.3 % — ABNORMAL LOW (ref 11.5–15.5)
WBC: 11 10*3/uL — ABNORMAL HIGH (ref 4.0–10.5)
nRBC: 0 % (ref 0.0–0.2)

## 2021-09-05 LAB — LACTIC ACID, PLASMA: Lactic Acid, Venous: 1.3 mmol/L (ref 0.5–1.9)

## 2021-09-05 LAB — APTT: aPTT: 27 seconds (ref 24–36)

## 2021-09-05 LAB — TROPONIN I (HIGH SENSITIVITY): Troponin I (High Sensitivity): 3 ng/L (ref <18)

## 2021-09-05 LAB — BRAIN NATRIURETIC PEPTIDE: B Natriuretic Peptide: 33.2 pg/mL (ref 0.0–100.0)

## 2021-09-05 MED ORDER — DIPHENHYDRAMINE HCL 50 MG/ML IJ SOLN: 50.0000 mg | Freq: Once | INTRAMUSCULAR | Status: AC

## 2021-09-05 MED ORDER — HYDROCORTISONE SOD SUC (PF) 250 MG IJ SOLR: 200.0000 mg | Freq: Once | INTRAMUSCULAR | Status: DC

## 2021-09-05 MED ORDER — DIPHENHYDRAMINE HCL 25 MG PO CAPS
50.0000 mg | ORAL_CAPSULE | Freq: Once | ORAL | Status: AC
  Administered 2021-09-05: 50 mg via ORAL
  Filled 2021-09-05: qty 2

## 2021-09-05 MED ORDER — IOHEXOL 350 MG/ML SOLN
100.0000 mL | Freq: Once | INTRAVENOUS | Status: AC | PRN
  Administered 2021-09-06: 100 mL via INTRAVENOUS

## 2021-09-05 MED ORDER — IPRATROPIUM-ALBUTEROL 0.5-2.5 (3) MG/3ML IN SOLN
3.0000 mL | Freq: Once | RESPIRATORY_TRACT | Status: AC
  Administered 2021-09-05: 3 mL via RESPIRATORY_TRACT
  Filled 2021-09-05: qty 3

## 2021-09-05 MED ORDER — HYDROCORTISONE SOD SUC (PF) 100 MG IJ SOLR
200.0000 mg | Freq: Once | INTRAMUSCULAR | Status: AC
  Administered 2021-09-05: 200 mg via INTRAVENOUS
  Filled 2021-09-05: qty 4

## 2021-09-05 NOTE — ED Provider Notes (Signed)
[facility] Provider Note   CSN: [id number] Arrival date & time: 09/05/21  1625     History Chief Complaint  Patient presents with   Influenza    Veronica Odom is a 45 y.o. 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  Patient states that she has been coughing more, however nonproductive very dry.  She states chest pain is aggravated upon deep inspiration, feels sharp in the substernal area.  She has been taking her albuterol inhaler as well as her daily inhaler without any relief.  She currently denies fever, chills, abdominal pain, nausea, urinary symptoms, vomiting, numbness and tingling.  She has chronic leg pain bilaterally, however has had worsening posterior leg pain on the right side over the last few days.  Initially she thought she felt a palpable swelling in the popliteal region of the right side, however it went away without intervention.   Influenza Presenting symptoms: cough and shortness of breath   Presenting symptoms: no fever, no headaches and no vomiting       Past Medical History:  Diagnosis Date   Asthma    DVT (deep venous thrombosis) (HCC)    GERD (gastroesophageal reflux disease)    Gout    HLD (hyperlipidemia)    Hypertension     Patient Active Problem List   Diagnosis Date Noted   COVID-19 virus infection 10/13/2019   Pneumonia due to COVID-19 virus 10/13/2019   Pulmonary nodules 10/13/2019   Acute pulmonary embolism (HCC) 10/12/2019   Acute vestibular syndrome 12/29/2017   Acute peripheral vestibulopathy, left 12/28/2017    Past Surgical History:  Procedure Laterality Date   ABDOMINAL HYSTERECTOMY     BREAST SURGERY     CHOLECYSTECTOMY     KNEE SURGERY     TUBAL LIGATION       OB History     Gravida  4   Para  4   Term      Preterm       AB      Living         SAB      IAB      Ectopic      Multiple      Live Births              Family History  Problem Relation Age of Onset   Heart failure Mother     Social History   Tobacco Use   Smoking status: Never   Smokeless tobacco: Never  Vaping Use   Vaping Use: Never used  Substance Use Topics   Alcohol use: Not Currently    Comment: occ   Drug use: No    Home Medications Prior to Admission medications   Medication Sig Start Date End Date Taking? Authorizing Provider  ADVAIR DISKUS 250-50 MCG/DOSE AEPB Inhale 1 puff into the lungs 2 (two) times daily. 05/17/19   [provider]  albuterol (VENTOLIN HFA) 108 (90 Base) MCG/ACT inhaler Inhale 1 puff into the lungs daily as needed for shortness of breath. 12/19/16   [provider]  apixaban (ELIQUIS) 5 MG TABS tablet Take 2 tablets (10 mg total) by mouth 2 (two) times daily. Patient not taking: Reported on 12/27/2020 10/16/19   Aquilla Hacker, MD  apixaban (ELIQUIS) 5 MG TABS tablet Take 1 tablet (5 mg  total) by mouth 2 (two) times daily. Patient not taking: Reported on 12/27/2020 10/23/19   Aquilla Hacker, MD  aspirin 81 MG chewable tablet Chew by mouth daily.    [provider]  atorvastatin (LIPITOR) 10 MG tablet Take 10 mg by mouth daily.    [provider]  beclomethasone (QVAR) 40 MCG/ACT inhaler Inhale 2 puffs into the lungs 2 (two) times daily. Patient not taking: Reported on 12/27/2020    [provider]  benzonatate (TESSALON) 100 MG capsule Take 1 capsule (100 mg total) by mouth every 8 (eight) hours. 10/04/19   Linwood Dibbles, MD  diclofenac Sodium (VOLTAREN) 1 % GEL Apply 4 g topically 4 (four) times daily. 07/11/20   Palumbo, April, MD  fluticasone (FLONASE) 50 MCG/ACT nasal spray Place 1 spray into both nostrils daily as needed for allergies or rhinitis.  12/13/16   [provider]  gabapentin (NEURONTIN) 300 MG capsule Take 300 mg by  mouth 3 (three) times daily. 07/14/19   [provider]  guaiFENesin-dextromethorphan (ROBITUSSIN DM) 100-10 MG/5ML syrup Take 10 mLs by mouth every 4 (four) hours as needed for cough. 10/16/19   Aquilla Hacker, MD  hydrochlorothiazide (HYDRODIURIL) 12.5 MG tablet Take 12.5 mg by mouth daily. 09/17/19   [provider]  lidocaine (LIDODERM) 5 % Place 1 patch onto the skin daily. Remove & Discard patch within 12 hours or as directed by MD 07/11/20   Palumbo, April, MD  magic mouthwash SOLN Take 5 mLs by mouth 3 (three) times daily as needed for mouth pain. Patient not taking: Reported on 10/12/2019 04/10/18   Kellie Shropshire, PA-C  meclizine (ANTIVERT) 25 MG tablet Take 1 tablet (25 mg total) by mouth 3 (three) times daily as needed for dizziness. 12/27/20   Elson Areas, PA-C  meclizine (ANTIVERT) 25 MG tablet TAKE 1 TABLET (25 MG TOTAL) BY MOUTH 3 (THREE) TIMES DAILY AS NEEDED FOR DIZZINESS. 12/27/20 12/27/21  Elson Areas, PA-C  methocarbamol (ROBAXIN) 500 MG tablet Take 1 tablet (500 mg total) by mouth 2 (two) times daily. 04/06/21   Mannie Stabile, PA-C  naproxen (NAPROSYN) 375 MG tablet Take 1 tablet twice daily for ankle pain. 01/26/20   Molpus, John, MD  ondansetron (ZOFRAN ODT) 4 MG disintegrating tablet Take 1 tablet (4 mg total) by mouth every 8 (eight) hours as needed for nausea or vomiting. 03/01/21   Remo Lipps, MD  oseltamivir (TAMIFLU) 75 MG capsule Take 1 capsule (75 mg total) by mouth every 12 (twelve) hours. 08/27/21   Gloris Manchester, MD  oxyCODONE-acetaminophen (PERCOCET) 5-325 MG tablet Take 1 tablet by mouth every 4 (four) hours as needed for severe pain. 01/26/20   Molpus, John, MD  potassium chloride (KLOR-CON) 10 MEQ tablet Take 10 mEq by mouth 2 (two) times daily.    [provider]  rivaroxaban (XARELTO) 10 MG TABS tablet Take 10 mg by mouth daily.    [provider]  senna-docusate (SENOKOT-S) 8.6-50 MG tablet Take 1 tablet by mouth  2 (two) times daily. 10/16/19   Aquilla Hacker, MD  tolterodine (DETROL LA) 2 MG 24 hr capsule Take 2 mg by mouth daily. 06/17/19   [provider]  topiramate (TOPAMAX) 100 MG tablet Take 100 mg by mouth at bedtime. 09/17/19   [provider]  Vitamin D, Ergocalciferol, (DRISDOL) 1.25 MG (50000 UNIT) CAPS capsule Take 50,000 Units by mouth once a week. 10/12/19   [provider]  Allergies    Shellfish-derived products, Ivp dye [iodinated diagnostic agents], Latex, and Penicillins  Review of Systems   Review of Systems  Constitutional:  Negative for fever.  HENT: Negative.    Eyes: Negative.   Respiratory:  Positive for cough and shortness of breath.   Cardiovascular:  Positive for chest pain.  Gastrointestinal:  Negative for abdominal pain and vomiting.  Endocrine: Negative.   Genitourinary: Negative.   Musculoskeletal: Negative.   Skin:  Negative for rash.  Neurological:  Negative for headaches.  All other systems reviewed and are negative.  Physical Exam Updated Vital Signs BP 131/84   Pulse (!) 105   Temp 99.4 F (37.4 C) (Oral)   Resp 16 Comment: Simultaneous filing. User may not have seen previous data.  Ht  (1.651 m)   Wt 92.1 kg   LMP 07/05/2018   SpO2 98%   BMI 33.78 kg/m   Physical Exam Vitals and nursing note reviewed.  Constitutional:      General: She is not in acute distress.    Appearance: She is ill-appearing.  HENT:     Head: Atraumatic.     Nose: Nose normal.  Eyes:     Extraocular Movements: Extraocular movements intact.     Conjunctiva/sclera: Conjunctivae normal.  Cardiovascular:     Rate and Rhythm: Regular rhythm. Tachycardia present.     Pulses: Normal pulses.          Radial pulses are 2+ on the right side and 2+ on the left side.       Dorsalis pedis pulses are 2+ on the right side and 2+ on the left side.     Heart sounds: No murmur heard.    Comments: Patient tachycardic in the 110s Pulmonary:      Effort: Pulmonary effort is normal. No accessory muscle usage or respiratory distress.     Breath sounds: Normal breath sounds.     Comments: Mild expiratory wheezing bilaterally.  No tachypnea, no accessory muscle use, no acute distress, no increased work of breathing, no decrease in air movement  Chest:     Comments: No focal tenderness Abdominal:     General: Abdomen is flat. There is no distension.     Palpations: Abdomen is soft.     Tenderness: There is no abdominal tenderness.     Comments: Abdomen soft, nondistended, nontender to palpation in all quadrants without guarding or peritoneal signs   Musculoskeletal:        General: Normal range of motion.     Cervical back: Normal range of motion.  Skin:    General: Skin is warm and dry.     Capillary Refill: Capillary refill takes less than 2 seconds.  Neurological:     General: No focal deficit present.     Mental Status: She is alert.  Psychiatric:        Mood and Affect: Mood normal.    ED Results / Procedures / Treatments   Labs (all labs ordered are listed, but only abnormal results are displayed) Labs Reviewed  CBC - Abnormal; Notable for the following components:      Result Value   WBC 11.0 (*)    RBC 3.86 (*)    RDW 11.3 (*)    All other components within normal limits  BRAIN NATRIURETIC PEPTIDE  PROTIME-INR  BASIC METABOLIC PANEL  APTT  LACTIC ACID, PLASMA  TROPONIN I (HIGH SENSITIVITY)    EKG None  Radiology DG Chest 2 View  Result Date: 09/05/2021 CLINICAL DATA:  Shortness of breath.  Cough. EXAM: CHEST - 2 VIEW COMPARISON:  Chest x-ray 10/12/2019. FINDINGS: The heart size and mediastinal contours are within normal limits. Both lungs are clear. The visualized skeletal structures are unremarkable. IMPRESSION: No active cardiopulmonary disease. Electronically Signed   By: Darliss Cheney M.D.   On: 09/05/2021 18:18    Procedures Procedures   Medications Ordered in ED Medications  diphenhydrAMINE  (BENADRYL) capsule 50 mg (has no administration in time range)    Or  diphenhydrAMINE (BENADRYL) injection 50 mg (has no administration in time range)  ipratropium-albuterol (DUONEB) 0.5-2.5 (3) MG/3ML nebulizer solution 3 mL (has no administration in time range)  hydrocortisone sodium succinate (SOLU-CORTEF) 100 MG injection 200 mg (200 mg Intravenous Given 09/05/21 2048)    ED Course  I have reviewed the triage vital signs and the nursing notes.  Pertinent labs & imaging results that were available during my care of the patient were reviewed by me and considered in my medical decision making (see chart for details).    MDM Rules/Calculators/A&P                         Initial impression: 45 year old female presents with symptoms described above.  On appearance she is nontoxic-appearing.  She is tachycardic between 105 and 110.  Vitals otherwise stable.  She is afebrile.  No chest wall tenderness.  Heart sounds normal.  Lung sounds with expiratory mild wheezing bilaterally without increased effort or accessory muscle use.  She is not tachypneic.  In the back of her right leg, she does endorse new posterior calf pain and has a palpable swelling in the popliteal region that is new for her as well.  Given her recent discontinuation of Xarelto 2 months ago and her previous history of PE/DVT will proceed with CTA.  Patient does have contrast allergy requiring prophylaxis of 30 mg hydrocortisone injection 50 mg Benadryl prior to CTA.  Workup: BNP normal, CBC with elevated white count at 11, INR normal, lactic acid within normal limits, normal Trope, BMP unremarkable.  Chest x-ray unremarkable.  CTA pending.   Plan: Patient has 4-hour window she waits for prophylactic medication to take to IV contrast.  This patient was handed off to Dr. Tilden Fossa, who will review patient's labs and pending CT for ultimate treatment and disposition.  All labs and imaging were independently reviewed and  interpreted by myself, Raynald Blend, PA-C  Final Clinical Impression(s) / ED Diagnoses Final diagnoses:  None    Rx / DC Orders ED Discharge Orders     None        Janell Quiet, [place] 09/06/21 [id number]    Tegeler, Canary Brim, MD 09/06/21 1143

## 2021-09-05 NOTE — ED Notes (Signed)
CT made aware Solu-Cortef has been administered at 2048.

## 2021-09-05 NOTE — ED Triage Notes (Signed)
Pt Dx FLu A 11/28 , c/o SOB and cough x 3 hrs

## 2021-09-06 MED ORDER — BENZONATATE 100 MG PO CAPS: 100.0000 mg | ORAL_CAPSULE | Freq: Three times a day (TID) | ORAL | 0 refills | Status: AC | PRN

## 2021-09-06 MED ORDER — CEFDINIR 300 MG PO CAPS: 300.0000 mg | ORAL_CAPSULE | Freq: Two times a day (BID) | ORAL | 0 refills | Status: AC

## 2021-09-06 MED ORDER — DOXYCYCLINE HYCLATE 100 MG PO TABS
100.0000 mg | ORAL_TABLET | Freq: Once | ORAL | Status: AC
  Administered 2021-09-06: 100 mg via ORAL
  Filled 2021-09-06: qty 1

## 2021-09-06 MED ORDER — DOXYCYCLINE MONOHYDRATE 100 MG PO CAPS: 100.0000 mg | ORAL_CAPSULE | Freq: Two times a day (BID) | ORAL | 0 refills | Status: AC

## 2021-09-06 NOTE — ED Notes (Signed)
Patient transported to CT 

## 2021-09-06 NOTE — ED Notes (Signed)
Patient returned from CT

## 2021-09-06 NOTE — ED Provider Notes (Signed)
Patient care assumed at midnight.  Patient with history of PE, recently had her anticoagulation discontinued here for evaluation of chest pain.  PE study is negative for PE but does demonstrate a left lower lobe pneumonia.  She is hemodynamically stable in the emergency department, no evidence of sepsis.  Plan to treat with oral antibiotics with outpatient follow-up and return precautions.   Tilden Fossa, MD 09/06/21 [phone]

## 2021-12-21 ENCOUNTER — Emergency Department (HOSPITAL_BASED_OUTPATIENT_CLINIC_OR_DEPARTMENT_OTHER)
Admission: EM | Admit: 2021-12-21 | Discharge: 2021-12-22 | Disposition: A | Discharge status: Home / Self Care | Payer: 59 | Attending: Emergency Medicine | Admitting: Emergency Medicine

## 2021-12-21 ENCOUNTER — Other Ambulatory Visit: Payer: Self-pay

## 2021-12-21 ENCOUNTER — Encounter (HOSPITAL_BASED_OUTPATIENT_CLINIC_OR_DEPARTMENT_OTHER): Payer: Self-pay

## 2021-12-21 ENCOUNTER — Emergency Department (HOSPITAL_BASED_OUTPATIENT_CLINIC_OR_DEPARTMENT_OTHER): Payer: 59

## 2021-12-21 DIAGNOSIS — Z91040 Latex allergy status: Secondary | ICD-10-CM | POA: Diagnosis not present

## 2021-12-21 DIAGNOSIS — Z7901 Long term (current) use of anticoagulants: Secondary | ICD-10-CM | POA: Insufficient documentation

## 2021-12-21 DIAGNOSIS — R791 Abnormal coagulation profile: Secondary | ICD-10-CM | POA: Insufficient documentation

## 2021-12-21 DIAGNOSIS — B349 Viral infection, unspecified: Secondary | ICD-10-CM | POA: Insufficient documentation

## 2021-12-21 DIAGNOSIS — M79604 Pain in right leg: Secondary | ICD-10-CM | POA: Diagnosis not present

## 2021-12-21 DIAGNOSIS — Z7982 Long term (current) use of aspirin: Secondary | ICD-10-CM | POA: Diagnosis not present

## 2021-12-21 DIAGNOSIS — R109 Unspecified abdominal pain: Secondary | ICD-10-CM | POA: Insufficient documentation

## 2021-12-21 DIAGNOSIS — Z20822 Contact with and (suspected) exposure to covid-19: Secondary | ICD-10-CM | POA: Diagnosis not present

## 2021-12-21 DIAGNOSIS — R0789 Other chest pain: Secondary | ICD-10-CM | POA: Insufficient documentation

## 2021-12-21 DIAGNOSIS — M546 Pain in thoracic spine: Secondary | ICD-10-CM

## 2021-12-21 DIAGNOSIS — R079 Chest pain, unspecified: Secondary | ICD-10-CM

## 2021-12-21 DIAGNOSIS — R059 Cough, unspecified: Secondary | ICD-10-CM | POA: Diagnosis present

## 2021-12-21 LAB — CBC WITH DIFFERENTIAL/PLATELET
Abs Immature Granulocytes: 0.01 10*3/uL (ref 0.00–0.07)
Basophils Absolute: 0 10*3/uL (ref 0.0–0.1)
Basophils Relative: 1 %
Eosinophils Absolute: 0.2 10*3/uL (ref 0.0–0.5)
Eosinophils Relative: 3 %
HCT: 35.8 % — ABNORMAL LOW (ref 36.0–46.0)
Hemoglobin: 11.8 g/dL — ABNORMAL LOW (ref 12.0–15.0)
Immature Granulocytes: 0 %
Lymphocytes Relative: 43 %
Lymphs Abs: 2.9 10*3/uL (ref 0.7–4.0)
MCH: 31.7 pg (ref 26.0–34.0)
MCHC: 33 g/dL (ref 30.0–36.0)
MCV: 96.2 fL (ref 80.0–100.0)
Monocytes Absolute: 0.7 10*3/uL (ref 0.1–1.0)
Monocytes Relative: 11 %
Neutro Abs: 2.7 10*3/uL (ref 1.7–7.7)
Neutrophils Relative %: 42 %
Platelets: 215 10*3/uL (ref 150–400)
RBC: 3.72 MIL/uL — ABNORMAL LOW (ref 3.87–5.11)
RDW: 11.9 % (ref 11.5–15.5)
WBC: 6.5 10*3/uL (ref 4.0–10.5)
nRBC: 0 % (ref 0.0–0.2)

## 2021-12-21 LAB — URINALYSIS, ROUTINE W REFLEX MICROSCOPIC
Bilirubin Urine: NEGATIVE
Glucose, UA: NEGATIVE mg/dL
Hgb urine dipstick: NEGATIVE
Ketones, ur: NEGATIVE mg/dL
Leukocytes,Ua: NEGATIVE
Nitrite: NEGATIVE
Protein, ur: NEGATIVE mg/dL
Specific Gravity, Urine: >=1.03 (ref 1.005–1.030)
pH: 5 (ref 5.0–8.0)

## 2021-12-21 LAB — RESP PANEL BY RT-PCR (FLU A&B, COVID) ARPGX2
Influenza A by PCR: NEGATIVE
Influenza B by PCR: NEGATIVE
SARS Coronavirus 2 by RT PCR: NEGATIVE

## 2021-12-21 LAB — COMPREHENSIVE METABOLIC PANEL
ALT: 24 U/L (ref 0–44)
AST: 29 U/L (ref 15–41)
Albumin: 3.5 g/dL (ref 3.5–5.0)
Alkaline Phosphatase: 52 U/L (ref 38–126)
Anion gap: 9 (ref 5–15)
BUN: 13 mg/dL (ref 6–20)
CO2: 22 mmol/L (ref 22–32)
Calcium: 8.6 mg/dL — ABNORMAL LOW (ref 8.9–10.3)
Chloride: 109 mmol/L (ref 98–111)
Creatinine, Ser: 0.82 mg/dL (ref 0.44–1.00)
GFR, Estimated: >60 mL/min (ref >60)
Glucose, Bld: 81 mg/dL (ref 70–99)
Potassium: 3.8 mmol/L (ref 3.5–5.1)
Sodium: 140 mmol/L (ref 135–145)
Total Bilirubin: 0.8 mg/dL (ref 0.3–1.2)
Total Protein: 7.3 g/dL (ref 6.5–8.1)

## 2021-12-21 LAB — PREGNANCY, URINE: Preg Test, Ur: NEGATIVE

## 2021-12-21 LAB — D-DIMER, QUANTITATIVE: D-Dimer, Quant: 0.54 ug/mL-FEU — ABNORMAL HIGH (ref 0.00–0.50)

## 2021-12-21 LAB — TROPONIN I (HIGH SENSITIVITY): Troponin I (High Sensitivity): 3 ng/L (ref <18)

## 2021-12-21 MED ORDER — DIPHENHYDRAMINE HCL 50 MG/ML IJ SOLN
50.0000 mg | Freq: Once | INTRAMUSCULAR | Status: AC
  Filled 2021-12-21: qty 1

## 2021-12-21 MED ORDER — IOHEXOL 350 MG/ML SOLN
100.0000 mL | Freq: Once | INTRAVENOUS | Status: AC | PRN
  Administered 2021-12-21: 75 mL via INTRAVENOUS

## 2021-12-21 MED ORDER — HYDROCORTISONE SOD SUC (PF) 250 MG IJ SOLR
200.0000 mg | Freq: Once | INTRAMUSCULAR | Status: DC
  Filled 2021-12-21: qty 200

## 2021-12-21 MED ORDER — HYDROCORTISONE SOD SUC (PF) 100 MG IJ SOLR
100.0000 mg | INTRAMUSCULAR | Status: AC
  Administered 2021-12-21 (×2): 100 mg via INTRAVENOUS
  Filled 2021-12-21 (×2): qty 2

## 2021-12-21 MED ORDER — DIPHENHYDRAMINE HCL 25 MG PO CAPS
50.0000 mg | ORAL_CAPSULE | Freq: Once | ORAL | Status: AC
  Administered 2021-12-21: 50 mg via ORAL
  Filled 2021-12-21: qty 2

## 2021-12-21 MED ORDER — KETOROLAC TROMETHAMINE 15 MG/ML IJ SOLN
15.0000 mg | Freq: Once | INTRAMUSCULAR | Status: AC
  Administered 2021-12-21: 15 mg via INTRAVENOUS
  Filled 2021-12-21: qty 1

## 2021-12-21 NOTE — ED Triage Notes (Signed)
Pt c/o flu like sx, right flank pain yesterday am-"knot" to right LE x this am-denies injury-state she has hx of blood clot to same leg-completed blood thinners x 4-5 months ago-NAD-steady gait ?

## 2021-12-21 NOTE — ED Provider Notes (Signed)
?[facility] EMERGENCY DEPARTMENT ?Provider Note ? ? ?CSN: [id number] ?Arrival date & time: 12/21/21  1307 ? ?  ? ?History ? ?Chief Complaint  ?Patient presents with  ? Cough  ? Leg Pain  ? ? ?Veronica Odom is a 46 y.o. female.  Presented to the emergency room with multiple different complaints.  Reports that over the last couple days she has not been feeling well.  States that she has been having pain in primarily on the right side of her chest as well as her abdomen.  Also has noted some pain in her right leg.  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  Feeling generally unwell, poor energy.  Pain is described as achy.  No fevers. ? ?HPI ? ?  ? ?Home Medications ?Prior to Admission medications   ?Medication Sig Start Date End Date Taking? Authorizing Provider  ?ADVAIR DISKUS 250-50 MCG/DOSE AEPB Inhale 1 puff into the lungs 2 (two) times daily. 05/17/19   [provider]  ?albuterol (VENTOLIN HFA) 108 (90 Base) MCG/ACT inhaler Inhale 1 puff into the lungs daily as needed for shortness of breath. 12/19/16   [provider]  ?apixaban (ELIQUIS) 5 MG TABS tablet Take 2 tablets (10 mg total) by mouth 2 (two) times daily. ?Patient not taking: Reported on 12/27/2020 10/16/19   Aquilla Hacker, MD  ?apixaban (ELIQUIS) 5 MG TABS tablet Take 1 tablet (5 mg total) by mouth 2 (two) times daily. ?Patient not taking: Reported on 12/27/2020 10/23/19   Aquilla Hacker, MD  ?aspirin 81 MG chewable tablet Chew by mouth daily.    [provider]  ?atorvastatin (LIPITOR) 10 MG tablet Take 10 mg by mouth daily.    [provider]  ?beclomethasone (QVAR) 40 MCG/ACT inhaler Inhale 2 puffs into the lungs 2 (two) times daily. ?Patient not taking: Reported on 12/27/2020    [provider]  ?benzonatate (TESSALON) 100 MG capsule Take 1 capsule (100 mg total) by mouth 3 (three) times daily as needed for cough. 09/06/21   Tilden Fossa, MD  ?cefdinir (OMNICEF) 300 MG  capsule Take 1 capsule (300 mg total) by mouth 2 (two) times daily. 09/06/21   Tilden Fossa, MD  ?diclofenac Sodium (VOLTAREN) 1 % GEL Apply 4 g topically 4 (four) times daily. 07/11/20   Palumbo, April, MD  ?doxycycline (MONODOX) 100 MG capsule Take 1 capsule (100 mg total) by mouth 2 (two) times daily. 09/06/21   Tilden Fossa, MD  ?fluticasone [facility]) 50 MCG/ACT nasal spray Place 1 spray into both nostrils daily as needed for allergies or rhinitis.  12/13/16   [provider]  ?gabapentin (NEURONTIN) 300 MG capsule Take 300 mg by mouth 3 (three) times daily. 07/14/19   [provider]  ?guaiFENesin-dextromethorphan (ROBITUSSIN DM) 100-10 MG/5ML syrup Take 10 mLs by mouth every 4 (four) hours as needed for cough. 10/16/19   Aquilla Hacker, MD  ?hydrochlorothiazide (HYDRODIURIL) 12.5 MG tablet Take 12.5 mg by mouth daily. 09/17/19   [provider]  ?lidocaine (LIDODERM) 5 % Place 1 patch onto the skin daily. Remove & Discard patch within 12 hours or as directed by MD 07/11/20   Nicanor Alcon, April, MD  ?magic mouthwash SOLN Take 5 mLs by mouth 3 (three) times daily as needed for mouth pain. ?Patient not taking: Reported on 10/12/2019 04/10/18   Kellie Shropshire, PA-C  ?meclizine (ANTIVERT) 25 MG tablet Take 1 tablet (25 mg total) by mouth 3 (three) times daily as needed for  dizziness. 12/27/20   Elson Areas, PA-C  ?meclizine (ANTIVERT) 25 MG tablet TAKE 1 TABLET (25 MG TOTAL) BY MOUTH 3 (THREE) TIMES DAILY AS NEEDED FOR DIZZINESS. 12/27/20 12/27/21  Elson Areas, PA-C  ?methocarbamol (ROBAXIN) 500 MG tablet Take 1 tablet (500 mg total) by mouth 2 (two) times daily. 04/06/21   Mannie Stabile, PA-C  ?naproxen (NAPROSYN) 375 MG tablet Take 1 tablet twice daily for ankle pain. 01/26/20   Molpus, John, MD  ?ondansetron (ZOFRAN ODT) 4 MG disintegrating tablet Take 1 tablet (4 mg total) by mouth every 8 (eight) hours as needed for nausea or vomiting. 03/01/21   Remo Lipps, MD   ?oseltamivir (TAMIFLU) 75 MG capsule Take 1 capsule (75 mg total) by mouth every 12 (twelve) hours. 08/27/21   Gloris Manchester, MD  ?oxyCODONE-acetaminophen (PERCOCET) 5-325 MG tablet Take 1 tablet by mouth every 4 (four) hours as needed for severe pain. 01/26/20   Molpus, John, MD  ?potassium chloride (KLOR-CON) 10 MEQ tablet Take 10 mEq by mouth 2 (two) times daily.    [provider]  ?rivaroxaban (XARELTO) 10 MG TABS tablet Take 10 mg by mouth daily.    [provider]  ?senna-docusate (SENOKOT-S) 8.6-50 MG tablet Take 1 tablet by mouth 2 (two) times daily. 10/16/19   Aquilla Hacker, MD  ?tolterodine (DETROL LA) 2 MG 24 hr capsule Take 2 mg by mouth daily. 06/17/19   [provider]  ?topiramate (TOPAMAX) 100 MG tablet Take 100 mg by mouth at bedtime. 09/17/19   [provider]  ?Vitamin D, Ergocalciferol, (DRISDOL) 1.25 MG (50000 UNIT) CAPS capsule Take 50,000 Units by mouth once a week. 10/12/19   [provider]  ?   ? ?Allergies    ?Shellfish-derived products, Ivp dye [iodinated contrast media], Latex, and Penicillins   ? ?Review of Systems   ?Review of Systems  ?Constitutional:  Positive for fatigue. Negative for chills and fever.  ?HENT:  Negative for ear pain and sore throat.   ?Eyes:  Negative for pain and visual disturbance.  ?Respiratory:  Negative for cough and shortness of breath.   ?Cardiovascular:  Positive for chest pain. Negative for palpitations.  ?Gastrointestinal:  Positive for abdominal pain. Negative for vomiting.  ?Genitourinary:  Negative for dysuria and hematuria.  ?Musculoskeletal:  Negative for arthralgias and back pain.  ?Skin:  Negative for color change and rash.  ?Neurological:  Negative for seizures and syncope.  ?All other systems reviewed and are negative. ? ?Physical Exam ?Updated Vital Signs ?BP 124/89   Pulse 93   Temp 98.7 ?F (37.1 ?C) (Oral)   Resp (!) 22   Ht 5\' 6"  (1.676 m)   Wt 95 kg   LMP 07/05/2018   SpO2 97%   BMI 33.80  kg/m?  ?Physical Exam ?Vitals and nursing note reviewed.  ?Constitutional:   ?   General: She is not in acute distress. ?   Appearance: She is well-developed.  ?HENT:  ?   Head: Normocephalic and atraumatic.  ?Eyes:  ?   Conjunctiva/sclera: Conjunctivae normal.  ?Cardiovascular:  ?   Rate and Rhythm: Normal rate and regular rhythm.  ?   Pulses: Normal pulses.  ?Pulmonary:  ?   Effort: Pulmonary effort is normal. No respiratory distress.  ?   Breath sounds: Normal breath sounds.  ?Abdominal:  ?   Palpations: Abdomen is soft.  ?   Tenderness: There is no abdominal tenderness.  ?Musculoskeletal:     ?  General: No swelling, tenderness, deformity or signs of injury.  ?   Cervical back: Neck supple.  ?   Right lower leg: No edema.  ?   Left lower leg: No edema.  ?Skin: ?   General: Skin is warm and dry.  ?   Capillary Refill: Capillary refill takes less than 2 seconds.  ?Neurological:  ?   General: No focal deficit present.  ?   Mental Status: She is alert.  ?Psychiatric:     ?   Mood and Affect: Mood normal.  ? ? ?ED Results / Procedures / Treatments   ?Labs ?(all labs ordered are listed, but only abnormal results are displayed) ?Labs Reviewed  ?CBC WITH DIFFERENTIAL/PLATELET - Abnormal; Notable for the following components:  ?    Result Value  ? RBC 3.72 (*)   ? Hemoglobin 11.8 (*)   ? HCT 35.8 (*)   ? All other components within normal limits  ?COMPREHENSIVE METABOLIC PANEL - Abnormal; Notable for the following components:  ? Calcium 8.6 (*)   ? All other components within normal limits  ?D-DIMER, QUANTITATIVE - Abnormal; Notable for the following components:  ? D-Dimer, Quant 0.54 (*)   ? All other components within normal limits  ?RESP PANEL BY RT-PCR (FLU A&B, COVID) ARPGX2  ?URINALYSIS, ROUTINE W REFLEX MICROSCOPIC  ?PREGNANCY, URINE  ?TROPONIN I (HIGH SENSITIVITY)  ? ? ?EKG ?None ? ?Radiology ?DG Chest 2 View ? ?Result Date: 12/21/2021 ?CLINICAL DATA:  Chest pain EXAM: CHEST - 2 VIEW COMPARISON:  09/19/2021  FINDINGS: The heart size and mediastinal contours are within normal limits. Both lungs are clear. The visualized skeletal structures are unremarkable. IMPRESSION: No active cardiopulmonary disease. Electronically Signed   By: Smith MincePal

## 2021-12-21 NOTE — ED Notes (Signed)
Second attempt to get patient for xray. RN still trying to get IV access  ?

## 2021-12-21 NOTE — ED Notes (Signed)
Attempted IV to LAC, tol well, unsuccessful. RN to attempt U/S guided IV ?

## 2021-12-22 MED ORDER — HYDROCODONE-ACETAMINOPHEN 5-325 MG PO TABS: 1.0000 | ORAL_TABLET | Freq: Four times a day (QID) | ORAL | 0 refills | Status: AC | PRN

## 2021-12-22 NOTE — ED Provider Notes (Signed)
Nursing notes and vitals signs, including pulse oximetry, reviewed. ? ?Summary of this visit's results, reviewed by myself: ? ?EKG: ? EKG Interpretation ? ?Date/Time:    ?Ventricular Rate:    ?PR Interval:    ?QRS Duration:   ?QT Interval:    ?QTC Calculation:   ?R Axis:     ?Text Interpretation:   ?  ? ?  ? ? ?Labs:  ?Results for orders placed or performed during the hospital encounter of 12/21/21 (from the past 24 hour(s))  ?Resp Panel by RT-PCR (Flu A&B, Covid) Nasopharyngeal Swab     Status: None  ? Collection Time: 12/21/21  1:33 PM  ? Specimen: Nasopharyngeal Swab; Nasopharyngeal(NP) swabs in vial transport medium  ?Result Value Ref Range  ? SARS Coronavirus 2 by RT PCR NEGATIVE NEGATIVE  ? Influenza A by PCR NEGATIVE NEGATIVE  ? Influenza B by PCR NEGATIVE NEGATIVE  ?Urinalysis, Routine w reflex microscopic Urine, Clean Catch     Status: None  ? Collection Time: 12/21/21  1:36 PM  ?Result Value Ref Range  ? Color, Urine YELLOW YELLOW  ? APPearance CLEAR CLEAR  ? Specific Gravity, Urine >=1.030 1.005 - 1.030  ? pH 5.0 5.0 - 8.0  ? Glucose, UA NEGATIVE NEGATIVE mg/dL  ? Hgb urine dipstick NEGATIVE NEGATIVE  ? Bilirubin Urine NEGATIVE NEGATIVE  ? Ketones, ur NEGATIVE NEGATIVE mg/dL  ? Protein, ur NEGATIVE NEGATIVE mg/dL  ? Nitrite NEGATIVE NEGATIVE  ? Leukocytes,Ua NEGATIVE NEGATIVE  ?Pregnancy, urine     Status: None  ? Collection Time: 12/21/21  3:11 PM  ?Result Value Ref Range  ? Preg Test, Ur NEGATIVE NEGATIVE  ?CBC with Differential     Status: Abnormal  ? Collection Time: 12/21/21  3:51 PM  ?Result Value Ref Range  ? WBC 6.5 4.0 - 10.5 K/uL  ? RBC 3.72 (L) 3.87 - 5.11 MIL/uL  ? Hemoglobin 11.8 (L) 12.0 - 15.0 g/dL  ? HCT 35.8 (L) 36.0 - 46.0 %  ? MCV 96.2 80.0 - 100.0 fL  ? MCH 31.7 26.0 - 34.0 pg  ? MCHC 33.0 30.0 - 36.0 g/dL  ? RDW 11.9 11.5 - 15.5 %  ? Platelets 215 150 - 400 K/uL  ? nRBC 0.0 0.0 - 0.2 %  ? Neutrophils Relative % 42 %  ? Neutro Abs 2.7 1.7 - 7.7 K/uL  ? Lymphocytes Relative 43 %  ?  Lymphs Abs 2.9 0.7 - 4.0 K/uL  ? Monocytes Relative 11 %  ? Monocytes Absolute 0.7 0.1 - 1.0 K/uL  ? Eosinophils Relative 3 %  ? Eosinophils Absolute 0.2 0.0 - 0.5 K/uL  ? Basophils Relative 1 %  ? Basophils Absolute 0.0 0.0 - 0.1 K/uL  ? Immature Granulocytes 0 %  ? Abs Immature Granulocytes 0.01 0.00 - 0.07 K/uL  ?Comprehensive metabolic panel     Status: Abnormal  ? Collection Time: 12/21/21  3:51 PM  ?Result Value Ref Range  ? Sodium 140 135 - 145 mmol/L  ? Potassium 3.8 3.5 - 5.1 mmol/L  ? Chloride 109 98 - 111 mmol/L  ? CO2 22 22 - 32 mmol/L  ? Glucose, Bld 81 70 - 99 mg/dL  ? BUN 13 6 - 20 mg/dL  ? Creatinine, Ser 0.82 0.44 - 1.00 mg/dL  ? Calcium 8.6 (L) 8.9 - 10.3 mg/dL  ? Total Protein 7.3 6.5 - 8.1 g/dL  ? Albumin 3.5 3.5 - 5.0 g/dL  ? AST 29 15 - 41 U/L  ? ALT 24 0 -  44 U/L  ? Alkaline Phosphatase 52 38 - 126 U/L  ? Total Bilirubin 0.8 0.3 - 1.2 mg/dL  ? GFR, Estimated >60 >60 mL/min  ? Anion gap 9 5 - 15  ?Troponin I (High Sensitivity)     Status: None  ? Collection Time: 12/21/21  3:51 PM  ?Result Value Ref Range  ? Troponin I (High Sensitivity) 3 <18 ng/L  ?D-dimer, quantitative     Status: Abnormal  ? Collection Time: 12/21/21  3:51 PM  ?Result Value Ref Range  ? D-Dimer, Quant 0.54 (H) 0.00 - 0.50 ug/mL-FEU  ? ? ?Imaging Studies: ?DG Chest 2 View ? ?Result Date: 12/21/2021 ?CLINICAL DATA:  Chest pain EXAM: CHEST - 2 VIEW COMPARISON:  09/19/2021 FINDINGS: The heart size and mediastinal contours are within normal limits. Both lungs are clear. The visualized skeletal structures are unremarkable. IMPRESSION: No active cardiopulmonary disease. Electronically Signed   By: Ernie AvenaPalani  Rathinasamy M.D.   On: 12/21/2021 16:21  ? ?CT Angio Chest PE W and/or Wo Contrast ? ?Result Date: 12/21/2021 ?CLINICAL DATA:  Shortness of breath right-sided chest pain EXAM: CT ANGIOGRAPHY CHEST WITH CONTRAST TECHNIQUE: Multidetector CT imaging of the chest was performed using the standard protocol during bolus administration of  intravenous contrast. Multiplanar CT image reconstructions and MIPs were obtained to evaluate the vascular anatomy. RADIATION DOSE REDUCTION: This exam was performed according to the departmental dose-optimization program which includes automated exposure control, adjustment of the mA and/or kV according to patient size and/or use of iterative reconstruction technique. CONTRAST:  75mL OMNIPAQUE IOHEXOL 350 MG/ML SOLN COMPARISON:  Chest x-ray 12/21/2021, CT chest 09/06/2021 FINDINGS: Cardiovascular: Satisfactory opacification of the pulmonary arteries to the segmental level. No evidence of pulmonary embolism. Normal heart size. No pericardial effusion. Nonaneurysmal aorta. No dissection is seen. Mediastinum/Nodes: No enlarged mediastinal, hilar, or axillary lymph nodes. Thyroid gland, trachea, and esophagus demonstrate no significant findings. Lungs/Pleura: Lungs are clear. No pleural effusion or pneumothorax. Upper Abdomen: No acute abnormality. Musculoskeletal: No chest wall abnormality. No acute or significant osseous findings. Partially visualized hypodensity upper pole left kidney possibly representing a cyst. Review of the MIP images confirms the above findings. IMPRESSION: 1. Negative for acute pulmonary embolus or aortic dissection. 2. Clear lung fields. Electronically Signed   By: Jasmine PangKim  Fujinaga M.D.   On: 12/21/2021 23:57  ? ?US Venous Img Lower Unilateral Right ? ?Result Date: 12/21/2021 ?CLINICAL DATA:  Right lower extremity pain and edema for the past day. History of right lower extremity DVT. Not currently on anticoagulation. Evaluate for acute or chronic DVT. EXAM: RIGHT LOWER EXTREMITY VENOUS DOPPLER ULTRASOUND TECHNIQUE: Gray-scale sonography with graded compression, as well as color Doppler and duplex ultrasound were performed to evaluate the lower extremity deep venous systems from the level of the common femoral vein and including the common femoral, femoral, profunda femoral, popliteal and calf  veins including the posterior tibial, peroneal and gastrocnemius veins when visible. The superficial great saphenous vein was also interrogated. Spectral Doppler was utilized to evaluate flow at rest and with distal augmentation maneuvers in the common femoral, femoral and popliteal veins. COMPARISON:  Right lower extremity venous Doppler ultrasound-10/12/2019 (negative) FINDINGS: Contralateral Common Femoral Vein: Respiratory phasicity is normal and symmetric with the symptomatic side. No evidence of thrombus. Normal compressibility. Common Femoral Vein: No evidence of thrombus. Normal compressibility, respiratory phasicity and response to augmentation. Saphenofemoral Junction: No evidence of thrombus. Normal compressibility and flow on color Doppler imaging. Profunda Femoral Vein: No evidence of thrombus. Normal compressibility and  flow on color Doppler imaging. Femoral Vein: No evidence of thrombus. Normal compressibility, respiratory phasicity and response to augmentation. Popliteal Vein: No evidence of thrombus. Normal compressibility, respiratory phasicity and response to augmentation. Calf Veins: No evidence of thrombus. Normal compressibility and flow on color Doppler imaging. Superficial Great Saphenous Vein: No evidence of thrombus. Normal compressibility. Venous Reflux:  None. Other Findings: Sonographic evaluation of patient's area of bruising within the right calf is negative for sonographic correlate (images 44 through 51). IMPRESSION: 1. No evidence of acute or chronic DVT within the right lower extremity. 2. No sonographic correlate for patient's area of bruising involving the right calf. Electronically Signed   By: Simonne Come M.D.   On: 12/21/2021 14:37  ? ?CT Renal Stone Study ? ?Result Date: 12/22/2021 ?CLINICAL DATA:  Flank pain.  Concern for kidney stone. EXAM: CT ABDOMEN AND PELVIS WITHOUT CONTRAST TECHNIQUE: Multidetector CT imaging of the abdomen and pelvis was performed following the  standard protocol without IV contrast. RADIATION DOSE REDUCTION: This exam was performed according to the departmental dose-optimization program which includes automated exposure control, adjustment of the mA and/o

## 2022-12-11 ENCOUNTER — Other Ambulatory Visit: Payer: Self-pay

## 2022-12-11 ENCOUNTER — Emergency Department (HOSPITAL_BASED_OUTPATIENT_CLINIC_OR_DEPARTMENT_OTHER): Payer: 59

## 2022-12-11 ENCOUNTER — Encounter (HOSPITAL_BASED_OUTPATIENT_CLINIC_OR_DEPARTMENT_OTHER): Payer: Self-pay | Admitting: Radiology

## 2022-12-11 ENCOUNTER — Emergency Department (HOSPITAL_BASED_OUTPATIENT_CLINIC_OR_DEPARTMENT_OTHER)
Admission: EM | Admit: 2022-12-11 | Discharge: 2022-12-11 | Disposition: A | Discharge status: Home / Self Care | Payer: 59 | Attending: Emergency Medicine | Admitting: Emergency Medicine

## 2022-12-11 DIAGNOSIS — Z7951 Long term (current) use of inhaled steroids: Secondary | ICD-10-CM | POA: Diagnosis not present

## 2022-12-11 DIAGNOSIS — R0602 Shortness of breath: Secondary | ICD-10-CM | POA: Insufficient documentation

## 2022-12-11 DIAGNOSIS — Z91040 Latex allergy status: Secondary | ICD-10-CM | POA: Insufficient documentation

## 2022-12-11 DIAGNOSIS — M7989 Other specified soft tissue disorders: Secondary | ICD-10-CM | POA: Diagnosis present

## 2022-12-11 DIAGNOSIS — I82431 Acute embolism and thrombosis of right popliteal vein: Secondary | ICD-10-CM | POA: Diagnosis not present

## 2022-12-11 DIAGNOSIS — J45909 Unspecified asthma, uncomplicated: Secondary | ICD-10-CM | POA: Diagnosis not present

## 2022-12-11 DIAGNOSIS — Z7982 Long term (current) use of aspirin: Secondary | ICD-10-CM | POA: Diagnosis not present

## 2022-12-11 DIAGNOSIS — Z7901 Long term (current) use of anticoagulants: Secondary | ICD-10-CM | POA: Diagnosis not present

## 2022-12-11 LAB — CBC WITH DIFFERENTIAL/PLATELET
Abs Immature Granulocytes: 0.01 10*3/uL (ref 0.00–0.07)
Basophils Absolute: 0 10*3/uL (ref 0.0–0.1)
Basophils Relative: 0 %
Eosinophils Absolute: 0.1 10*3/uL (ref 0.0–0.5)
Eosinophils Relative: 1 %
HCT: 38.4 % (ref 36.0–46.0)
Hemoglobin: 12.6 g/dL (ref 12.0–15.0)
Immature Granulocytes: 0 %
Lymphocytes Relative: 43 %
Lymphs Abs: 2.7 10*3/uL (ref 0.7–4.0)
MCH: 31.3 pg (ref 26.0–34.0)
MCHC: 32.8 g/dL (ref 30.0–36.0)
MCV: 95.5 fL (ref 80.0–100.0)
Monocytes Absolute: 0.4 10*3/uL (ref 0.1–1.0)
Monocytes Relative: 7 %
Neutro Abs: 3 10*3/uL (ref 1.7–7.7)
Neutrophils Relative %: 49 %
Platelets: 237 10*3/uL (ref 150–400)
RBC: 4.02 MIL/uL (ref 3.87–5.11)
RDW: 11.7 % (ref 11.5–15.5)
WBC: 6.3 10*3/uL (ref 4.0–10.5)
nRBC: 0 % (ref 0.0–0.2)

## 2022-12-11 LAB — PREGNANCY, URINE: Preg Test, Ur: NEGATIVE

## 2022-12-11 LAB — BASIC METABOLIC PANEL
Anion gap: 7 (ref 5–15)
BUN: 19 mg/dL (ref 6–20)
CO2: 26 mmol/L (ref 22–32)
Calcium: 9 mg/dL (ref 8.9–10.3)
Chloride: 103 mmol/L (ref 98–111)
Creatinine, Ser: 1.04 mg/dL — ABNORMAL HIGH (ref 0.44–1.00)
GFR, Estimated: >60 mL/min (ref >60)
Glucose, Bld: 111 mg/dL — ABNORMAL HIGH (ref 70–99)
Potassium: 4.1 mmol/L (ref 3.5–5.1)
Sodium: 136 mmol/L (ref 135–145)

## 2022-12-11 LAB — TROPONIN I (HIGH SENSITIVITY)
Troponin I (High Sensitivity): 2 ng/L (ref <18)
Troponin I (High Sensitivity): 2 ng/L (ref <18)

## 2022-12-11 MED ORDER — METHYLPREDNISOLONE SODIUM SUCC 125 MG IJ SOLR
125.0000 mg | INTRAMUSCULAR | Status: AC
  Administered 2022-12-11: 125 mg via INTRAVENOUS
  Filled 2022-12-11: qty 2

## 2022-12-11 MED ORDER — DIPHENHYDRAMINE HCL 50 MG/ML IJ SOLN
50.0000 mg | Freq: Once | INTRAMUSCULAR | Status: AC
  Administered 2022-12-11: 50 mg via INTRAVENOUS
  Filled 2022-12-11: qty 1

## 2022-12-11 MED ORDER — APIXABAN (ELIQUIS) VTE STARTER PACK (10MG AND 5MG): ORAL_TABLET | ORAL | 0 refills | Status: DC

## 2022-12-11 MED ORDER — IOHEXOL 350 MG/ML SOLN
100.0000 mL | Freq: Once | INTRAVENOUS | Status: AC | PRN
  Administered 2022-12-11: 100 mL via INTRAVENOUS

## 2022-12-11 MED ORDER — APIXABAN (ELIQUIS) VTE STARTER PACK (10MG AND 5MG): ORAL_TABLET | ORAL | 0 refills | Status: AC

## 2022-12-11 MED ORDER — ENOXAPARIN SODIUM 100 MG/ML IJ SOSY
90.0000 mg | PREFILLED_SYRINGE | Freq: Once | INTRAMUSCULAR | Status: AC
  Administered 2022-12-11: 90 mg via SUBCUTANEOUS
  Filled 2022-12-11: qty 1

## 2022-12-11 MED ORDER — FAMOTIDINE IN NACL 20-0.9 MG/50ML-% IV SOLN
20.0000 mg | Freq: Once | INTRAVENOUS | Status: AC
  Administered 2022-12-11: 20 mg via INTRAVENOUS
  Filled 2022-12-11: qty 50

## 2022-12-11 NOTE — Discharge Instructions (Signed)
Follow up with your PCP and vascular in the office.  Return for worsening chest pain, sob, coughing up blood or if you feel like you might pass out.

## 2022-12-11 NOTE — ED Triage Notes (Signed)
Pt reports pain and tightness to right leg and has swelling to right ankle and foot for the last couple days. Pt has hx of DVT. Pt denies injury. Pt also reports intermittent shortness of breath which she feels is a flare up of her asthma. Pt requesting breathing treatment.

## 2022-12-11 NOTE — ED Provider Notes (Signed)
Received patient in turnover from Dr. Randal Buba.  Please see their note for further details of Hx, PE.  Briefly patient is a 47 y.o. female with a Foot Swelling and Leg Pain .  Patient with a history of DVT, supposed be on lifelong anticoagulation however was taken off by her primary care provider.  Found to have new foot and leg swelling.  Positive DVT here.  Patient was also having some chest symptoms.  Plan for CT angiogram of the chest to evaluate for possible pulmonary embolism. CTA negative for PE.  D/c home.  PCP follow up.     Deno Etienne, DO 12/11/22 [phone]

## 2022-12-11 NOTE — ED Provider Notes (Signed)
[place] [facility] AT [facility] Provider Note   CSN: [id number] Arrival date & time: 12/11/22  0020     History  Chief Complaint  Patient presents with   Foot Swelling   Leg Pain    Veronica Odom is a 47 y.o. female.  The history is provided by the patient.  Leg Pain Location:  Foot, knee and leg Time since incident: few days. 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 Authorizing Provider  APIXABAN (ELIQUIS) VTE STARTER PACK ('10MG'$  AND '5MG'$ ) Take as directed on package: start with two-'5mg'$  tablets twice daily for 7 days. On day 8, switch to one-'5mg'$  tablet twice daily. 12/11/22  Yes Daley Gosse, MD  ADVAIR DISKUS 250-50 MCG/DOSE AEPB Inhale 1 puff into the lungs 2 (two) times daily. 05/17/19   [provider]  albuterol (VENTOLIN HFA) 108 (90 Base) MCG/ACT inhaler Inhale 1 puff into the lungs daily as needed for shortness of breath. 12/19/16   [provider]  aspirin 81 MG chewable tablet Chew by mouth daily.    [provider]  atorvastatin (LIPITOR) 10 MG tablet Take 10 mg by mouth daily.    [provider]  benzonatate (TESSALON) 100 MG capsule Take 1 capsule (100 mg total) by mouth 3 (three) times daily as needed for cough. 09/06/21   Quintella Reichert, MD  cefdinir (OMNICEF) 300 MG capsule Take 1 capsule (300 mg total) by mouth 2 (two) times daily. 09/06/21   Quintella Reichert, MD  diclofenac Sodium (VOLTAREN) 1 % GEL Apply 4 g topically 4 (four) times  daily. 07/11/20   Enyah Moman, MD  doxycycline (MONODOX) 100 MG capsule Take 1 capsule (100 mg total) by mouth 2 (two) times daily. 09/06/21   Quintella Reichert, MD  fluticasone [facility]) 50 MCG/ACT nasal spray Place 1 spray into both nostrils daily as needed for allergies or rhinitis.  12/13/16   [provider]  gabapentin (NEURONTIN) 300 MG capsule Take 300 mg by mouth 3 (three) times daily. 07/14/19   [provider]  guaiFENesin-dextromethorphan (ROBITUSSIN DM) 100-10 MG/5ML syrup Take 10 mLs by mouth every 4 (four) hours as needed for cough. 10/16/19   Elie Confer, MD  hydrochlorothiazide (HYDRODIURIL) 12.5 MG tablet Take 12.5 mg by mouth daily. 09/17/19   [provider]  HYDROcodone-acetaminophen (NORCO) 5-325 MG tablet Take 1 tablet by mouth every 6 (six) hours as needed for severe pain. 12/22/21   Molpus, John, MD  lidocaine (LIDODERM) 5 % Place 1 patch onto the skin daily. Remove & Discard patch within 12 hours or as directed by MD 07/11/20   Randal Buba, Katrenia Alkins, MD  meclizine (ANTIVERT) 25 MG tablet Take 1 tablet (25 mg total) by mouth 3 (three) times daily as needed for dizziness. 12/27/20   Fransico Meadow, PA-C  methocarbamol (ROBAXIN) 500 MG tablet Take 1 tablet (500 mg total) by mouth 2 (two) times daily. 04/06/21   Suzy Bouchard, PA-C  naproxen (NAPROSYN) 375 MG tablet Take 1 tablet twice daily for ankle pain. 01/26/20   Molpus, John, MD  ondansetron (ZOFRAN ODT) 4 MG disintegrating tablet Take 1 tablet (4 mg total) by mouth every 8 (eight) hours as needed for nausea or vomiting. 03/01/21   Andrew Au, MD  oseltamivir (TAMIFLU) 75 MG capsule Take 1 capsule (75 mg total) by mouth every 12 (twelve) hours. 08/27/21   Godfrey Pick, MD  potassium chloride (KLOR-CON) 10 MEQ tablet Take 10 mEq by mouth 2 (two) times daily.    [provider]  rivaroxaban (XARELTO) 10 MG TABS tablet Take 10 mg by mouth daily.    [provider]  senna-docusate  (SENOKOT-S) 8.6-50 MG tablet Take 1 tablet by mouth 2 (two) times daily. 10/16/19   Elie Confer, MD  tolterodine (DETROL LA) 2 MG 24 hr capsule Take 2 mg by mouth daily. 06/17/19   [provider]  topiramate (TOPAMAX) 100 MG tablet Take 100 mg by mouth at bedtime. 09/17/19   [provider]  Vitamin D, Ergocalciferol, (DRISDOL) 1.25 MG (50000 UNIT) CAPS capsule Take 50,000 Units by mouth once a week. 10/12/19   [provider]      Allergies    Shellfish-derived products, Ivp dye [iodinated contrast media], Latex, and Penicillins    Review of Systems   Review of Systems  Constitutional:  Negative for fever.  HENT:  Negative for facial swelling.   Respiratory:  Positive for shortness of breath.   Cardiovascular:  Positive for leg swelling.  All other systems reviewed and are negative.   Physical Exam Updated Vital Signs BP (!) 126/90   Pulse 82   Temp 98.6 F (37 C) (Oral)   Resp 16   Ht '5\' 5"'$  (1.651 m)   Wt 92.1 kg   LMP 07/05/2018   SpO2 98%   BMI 33.78 kg/m  Physical Exam Vitals and nursing note reviewed.  Constitutional:      General: She is not in acute distress.    Appearance: Normal appearance. She is well-developed.  HENT:     Head: Normocephalic and atraumatic.     Nose: Nose normal.  Eyes:     Pupils: Pupils are equal, round, and reactive to light.  Cardiovascular:     Rate and Rhythm: Normal rate and regular rhythm.     Pulses: Normal pulses.     Heart sounds: Normal heart sounds.  Pulmonary:     Effort: Pulmonary effort is normal. No respiratory distress.     Breath sounds: Normal breath sounds.  Abdominal:     General: Bowel sounds are normal. There is no distension.     Palpations: Abdomen is soft.     Tenderness: There is no abdominal tenderness. There is no guarding or rebound.  Genitourinary:    Vagina: No vaginal discharge.  Musculoskeletal:        General: Normal range of motion.     Cervical back: Neck supple.      Right lower leg: No edema.     Left lower leg: No edema.  Skin:    General: Skin is warm and dry.     Capillary Refill: Capillary refill takes less than 2 seconds.     Findings: No erythema or rash.  Neurological:     General: No focal deficit present.     Mental Status: She is alert.     Deep Tendon Reflexes: Reflexes normal.  Psychiatric:  Mood and Affect: Mood normal.     ED Results / Procedures / Treatments   Labs (all labs ordered are listed, but only abnormal results are displayed) Results for orders placed or performed during the hospital encounter of 12/11/22  Pregnancy, urine  Result Value Ref Range   Preg Test, Ur NEGATIVE NEGATIVE  Basic metabolic panel  Result Value Ref Range   Sodium 136 135 - 145 mmol/L   Potassium 4.1 3.5 - 5.1 mmol/L   Chloride 103 98 - 111 mmol/L   CO2 26 22 - 32 mmol/L   Glucose, Bld 111 (H) 70 - 99 mg/dL   BUN 19 6 - 20 mg/dL   Creatinine, Ser 1.04 (H) 0.44 - 1.00 mg/dL   Calcium 9.0 8.9 - 10.3 mg/dL   GFR, Estimated >60 >60 mL/min   Anion gap 7 5 - 15  CBC with Differential/Platelet  Result Value Ref Range   WBC 6.3 4.0 - 10.5 K/uL   RBC 4.02 3.87 - 5.11 MIL/uL   Hemoglobin 12.6 12.0 - 15.0 g/dL   HCT 38.4 36.0 - 46.0 %   MCV 95.5 80.0 - 100.0 fL   MCH 31.3 26.0 - 34.0 pg   MCHC 32.8 30.0 - 36.0 g/dL   RDW 11.7 11.5 - 15.5 %   Platelets 237 150 - 400 K/uL   nRBC 0.0 0.0 - 0.2 %   Neutrophils Relative % 49 %   Neutro Abs 3.0 1.7 - 7.7 K/uL   Lymphocytes Relative 43 %   Lymphs Abs 2.7 0.7 - 4.0 K/uL   Monocytes Relative 7 %   Monocytes Absolute 0.4 0.1 - 1.0 K/uL   Eosinophils Relative 1 %   Eosinophils Absolute 0.1 0.0 - 0.5 K/uL   Basophils Relative 0 %   Basophils Absolute 0.0 0.0 - 0.1 K/uL   Immature Granulocytes 0 %   Abs Immature Granulocytes 0.01 0.00 - 0.07 K/uL  Troponin I (High Sensitivity)  Result Value Ref Range   Troponin I (High Sensitivity) 2 <18 ng/L  Troponin I (High Sensitivity)  Result  Value Ref Range   Troponin I (High Sensitivity) 2 <18 ng/L   DG Chest Portable 1 View  Result Date: 12/11/2022 CLINICAL DATA:  Pain with intermittent shortness of breath. History of asthma. EXAM: PORTABLE CHEST 1 VIEW COMPARISON:  12/21/2021. FINDINGS: The heart size and mediastinal contours are within normal limits. No consolidation, effusion, or pneumothorax. Neurostimulator leads project over the thoracic spinal canal. Surgical clips are present in the right upper quadrant. No acute osseous abnormality. IMPRESSION: No active disease. Electronically Signed   By: Brett Fairy M.D.   On: 12/11/2022 01:46   US Venous Img Lower Unilateral Right  Result Date: 12/11/2022 CLINICAL DATA:  Right foot and calf pain with swelling for 3 days. EXAM: RIGHT LOWER EXTREMITY VENOUS DOPPLER ULTRASOUND TECHNIQUE: Gray-scale sonography with graded compression, as well as color Doppler and duplex ultrasound were performed to evaluate the lower extremity deep venous systems from the level of the common femoral vein and including the common femoral, femoral, profunda femoral, popliteal and calf veins including the posterior tibial, peroneal and gastrocnemius veins when visible. The superficial great saphenous vein was also interrogated. Spectral Doppler was utilized to evaluate flow at rest and with distal augmentation maneuvers in the common femoral, femoral and popliteal veins. COMPARISON:  12/21/2021. FINDINGS: Contralateral Common Femoral Vein: Respiratory phasicity is normal and symmetric with the symptomatic side. No evidence of thrombus. Normal compressibility. Common Femoral Vein: No evidence of  thrombus. Normal compressibility, respiratory phasicity and response to augmentation. Saphenofemoral Junction: No evidence of thrombus. Normal compressibility and flow on color Doppler imaging. Profunda Femoral Vein: No evidence of thrombus. Normal compressibility and flow on color Doppler imaging. Femoral Vein: No evidence of  thrombus. Normal compressibility, respiratory phasicity and response to augmentation. Popliteal Vein: Nonocclusive thrombus is present in the distal popliteal vein. Calf Veins: No evidence of thrombus. Normal compressibility and flow on color Doppler imaging. Superficial Great Saphenous Vein: No evidence of thrombus. Normal compressibility. Venous Reflux:  None. Other Findings:  None. IMPRESSION: Nonocclusive thrombus in the distal popliteal vein. Electronically Signed   By: Brett Fairy M.D.   On: 12/11/2022 01:43    None  Radiology DG Chest Portable 1 View  Result Date: 12/11/2022 CLINICAL DATA:  Pain with intermittent shortness of breath. History of asthma. EXAM: PORTABLE CHEST 1 VIEW COMPARISON:  12/21/2021. FINDINGS: The heart size and mediastinal contours are within normal limits. No consolidation, effusion, or pneumothorax. Neurostimulator leads project over the thoracic spinal canal. Surgical clips are present in the right upper quadrant. No acute osseous abnormality. IMPRESSION: No active disease. Electronically Signed   By: Brett Fairy M.D.   On: 12/11/2022 01:46   US Venous Img Lower Unilateral Right  Result Date: 12/11/2022 CLINICAL DATA:  Right foot and calf pain with swelling for 3 days. EXAM: RIGHT LOWER EXTREMITY VENOUS DOPPLER ULTRASOUND TECHNIQUE: Gray-scale sonography with graded compression, as well as color Doppler and duplex ultrasound were performed to evaluate the lower extremity deep venous systems from the level of the common femoral vein and including the common femoral, femoral, profunda femoral, popliteal and calf veins including the posterior tibial, peroneal and gastrocnemius veins when visible. The superficial great saphenous vein was also interrogated. Spectral Doppler was utilized to evaluate flow at rest and with distal augmentation maneuvers in the common femoral, femoral and popliteal veins. COMPARISON:  12/21/2021. FINDINGS: Contralateral Common Femoral Vein:  Respiratory phasicity is normal and symmetric with the symptomatic side. No evidence of thrombus. Normal compressibility. Common Femoral Vein: No evidence of thrombus. Normal compressibility, respiratory phasicity and response to augmentation. Saphenofemoral Junction: No evidence of thrombus. Normal compressibility and flow on color Doppler imaging. Profunda Femoral Vein: No evidence of thrombus. Normal compressibility and flow on color Doppler imaging. Femoral Vein: No evidence of thrombus. Normal compressibility, respiratory phasicity and response to augmentation. Popliteal Vein: Nonocclusive thrombus is present in the distal popliteal vein. Calf Veins: No evidence of thrombus. Normal compressibility and flow on color Doppler imaging. Superficial Great Saphenous Vein: No evidence of thrombus. Normal compressibility. Venous Reflux:  None. Other Findings:  None. IMPRESSION: Nonocclusive thrombus in the distal popliteal vein. Electronically Signed   By: Brett Fairy M.D.   On: 12/11/2022 01:43    EKG NSR HR 93   Procedures Procedures    Medications Ordered in ED Medications  enoxaparin (LOVENOX) injection 92.5 mg (has no administration in time range)  methylPREDNISolone sodium succinate (SOLU-MEDROL) 125 mg/2 mL injection 125 mg (125 mg Intravenous Given 12/11/22 0300)  diphenhydrAMINE (BENADRYL) injection 50 mg (50 mg Intravenous Given 12/11/22 0557)  famotidine (PEPCID) IVPB 20 mg premix (0 mg Intravenous Stopped 12/11/22 0334)    ED Course/ Medical Decision Making/ A&P                             Medical Decision Making Patient with multiple previous DVTs and PE present with Leg pain and SOb.  Also  has hive allergy to contrast.    Amount and/or Complexity of Data Reviewed External Data Reviewed: notes.    Details: Previous notes reviewed  Labs: ordered.    Details: All labs reviewed: negative pregnancy test.  Rule out for Mi with 2 negative troponins 2/2.  Normal sodium 136, normal  potassium 4, creatinine slight high 1.04.  normal white count 6.3 normal hemoglobin 12.6, normal platelets  Radiology: ordered and independent interpretation performed.    Details: DVT on [place]. Negative CXR by me  ECG/medicine tests: ordered and independent interpretation performed. Decision-making details documented in ED Course.  Risk Prescription drug management. Risk Details: Well appearing.  Emergency prep for patient's with contrast allergies initiated in the ED.  Given lovenox pending CTA.  Will sign out to Dr. Tyrone Nine pending CTA  Given emergency prep per radiology protocol for allergy to contrast  Final Clinical Impression(s) / ED Diagnoses Final diagnoses:  Acute deep vein thrombosis (DVT) of popliteal vein of right lower extremity ([place])   Signed out to Dr. Tyrone Nine. If CTA positive likely needs admission.  CTA pending at this time.        Ross Bender, MD 12/11/22 [phone]

## 2022-12-11 NOTE — ED Notes (Signed)
Reviewed discharge instructions and prescription with pt. Pt states understanding. Stating she feels better and is able to ambulate at discharge

## 2023-05-13 IMAGING — CT CT ANGIO CHEST
2 of 9 series · 19 of 36 positions shown · IV contrast (Omnipaque)
Comparison: Chest CT dated 10/12/2019 and radiograph dated
09/05/2021.

CLINICAL DATA: Concern for pulmonary embolism.

EXAM:
CT ANGIOGRAPHY CHEST WITH CONTRAST
TECHNIQUE: Multidetector CT imaging of the chest was performed using the
standard protocol during bolus administration of intravenous
contrast. Multiplanar CT image reconstructions and MIPs were
obtained to evaluate the vascular anatomy.
CONTRAST:  100mL OMNIPAQUE IOHEXOL 350 MG/ML SOLN

[Series 7: pe thins · axial · 0.64mm/px · z∈[-260,-46]mm · 18 of 240 slices shown]
[im 13/240  lung]
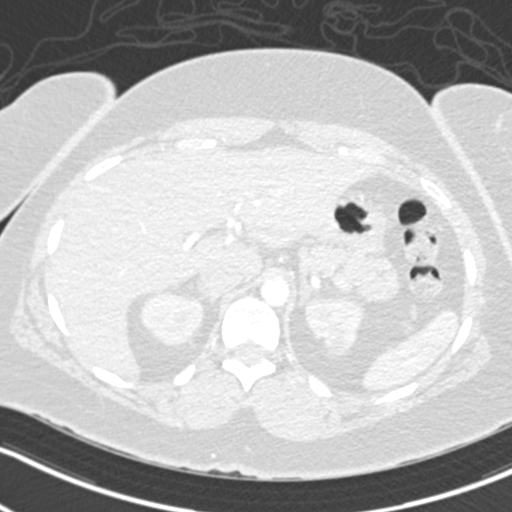
[im 26/240  mediastinal]
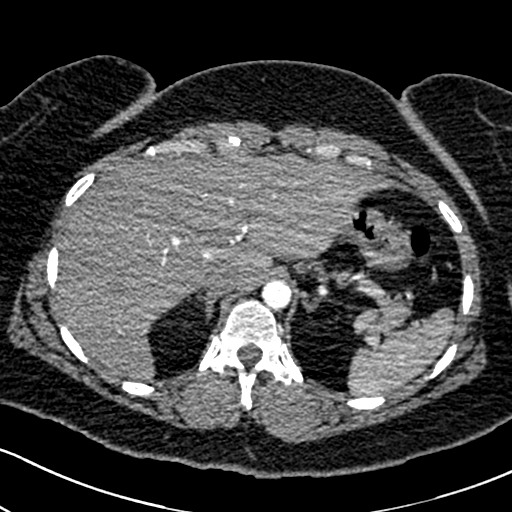
[im 38/240  lung]
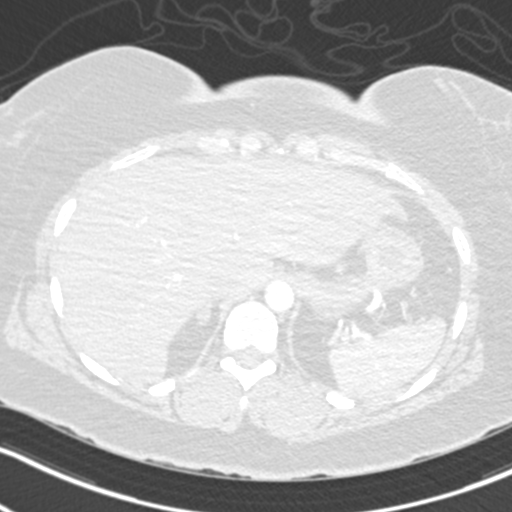
[im 51/240  mediastinal]
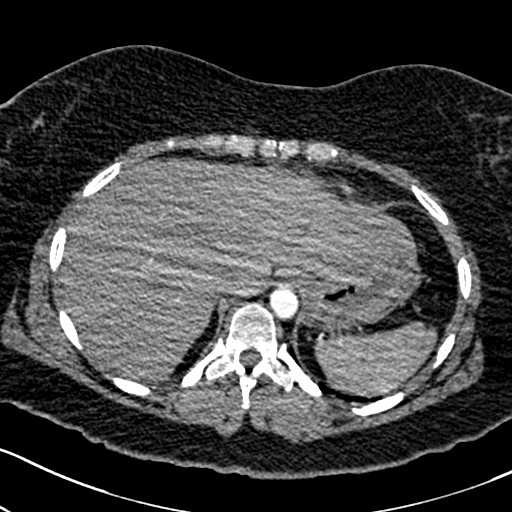
[im 63/240  lung]
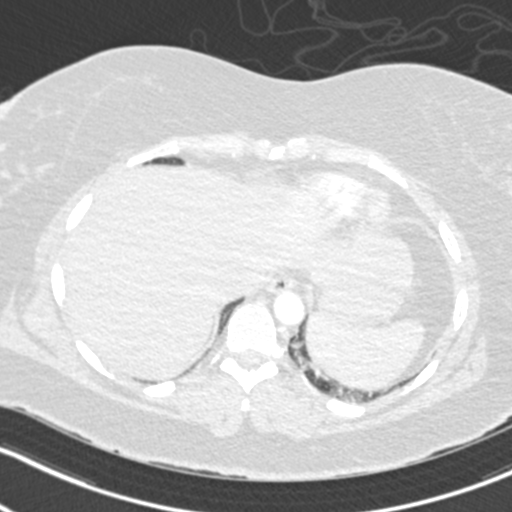
[im 76/240  mediastinal]
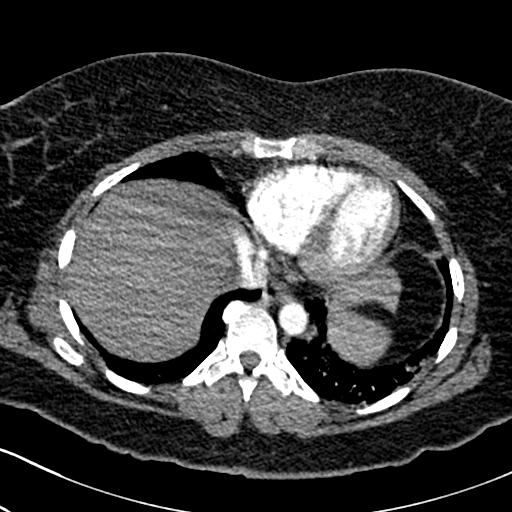
[im 89/240  lung]
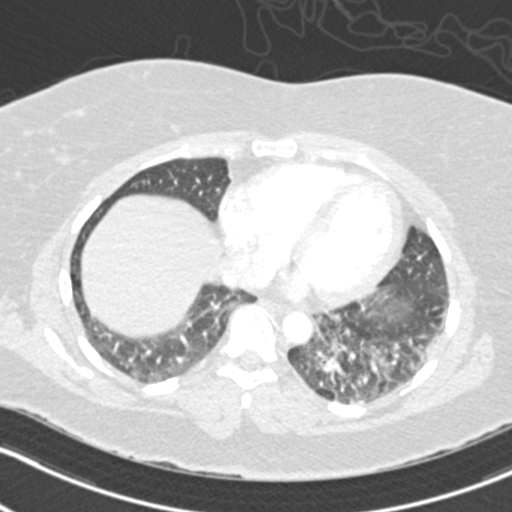
[im 101/240  mediastinal]
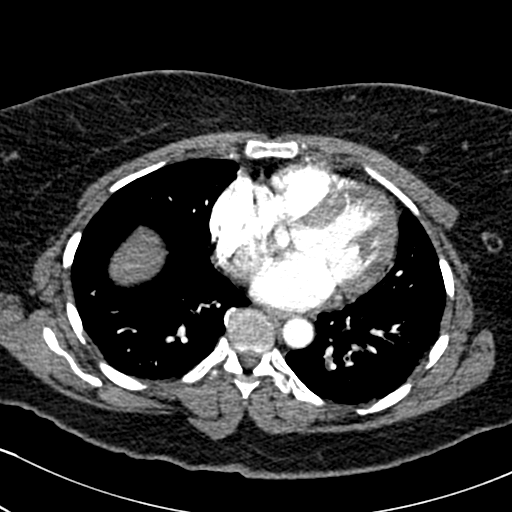
[im 114/240  lung]
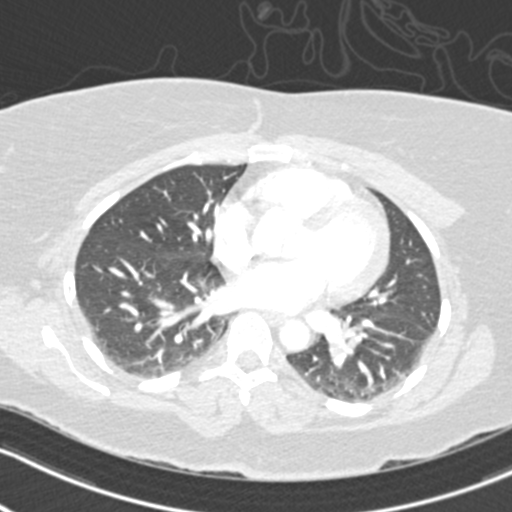
[im 126/240  mediastinal]
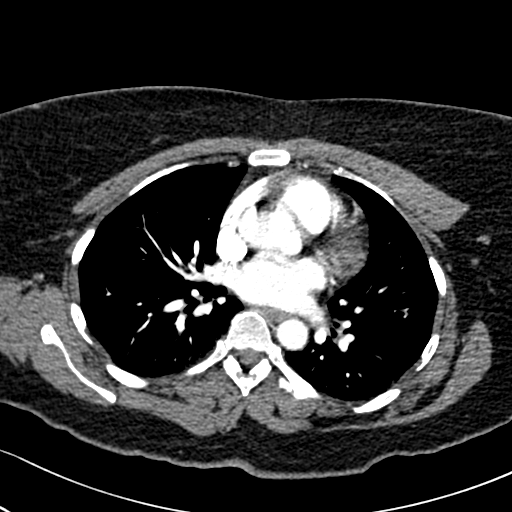
[im 139/240  lung]
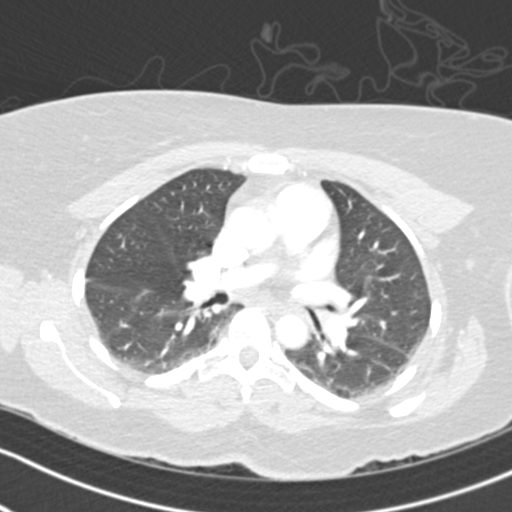
[im 151/240  mediastinal]
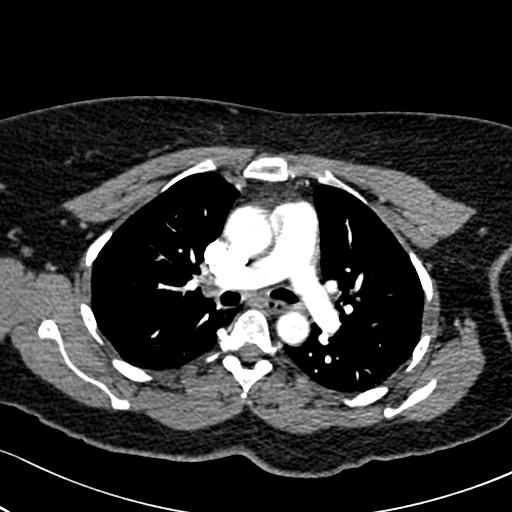
[im 164/240  lung]
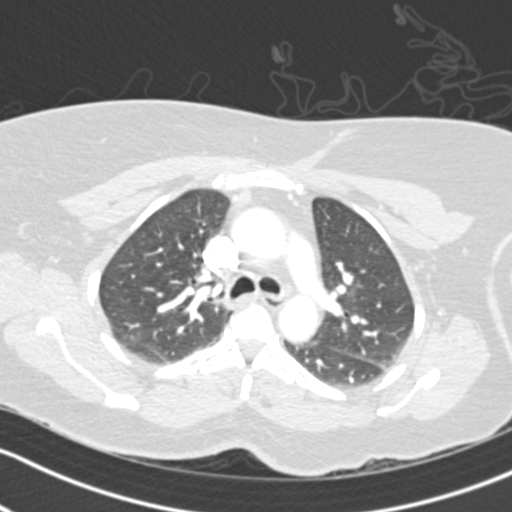
[im 177/240  mediastinal]
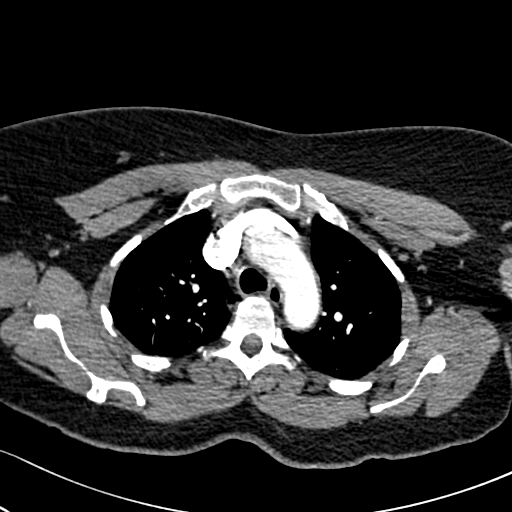
[im 189/240  lung]
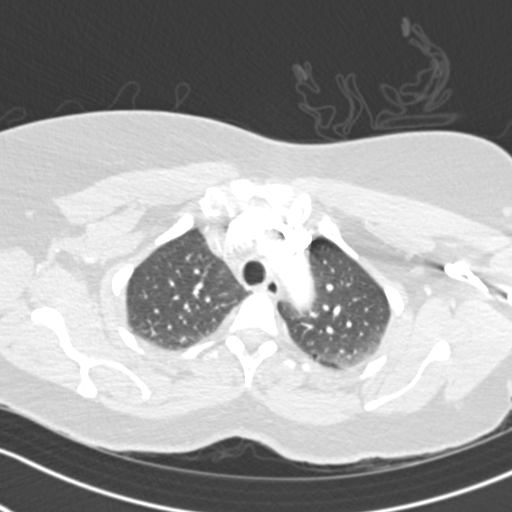
[im 202/240  mediastinal]
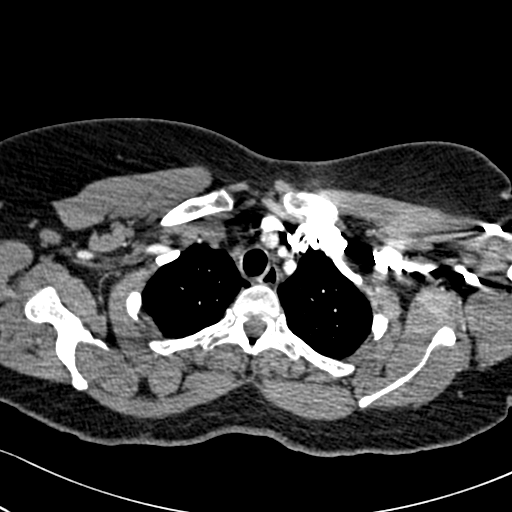
[im 214/240  lung]
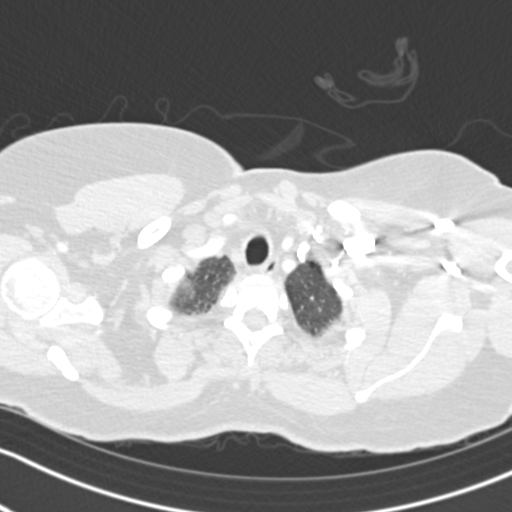
[im 227/240  mediastinal]
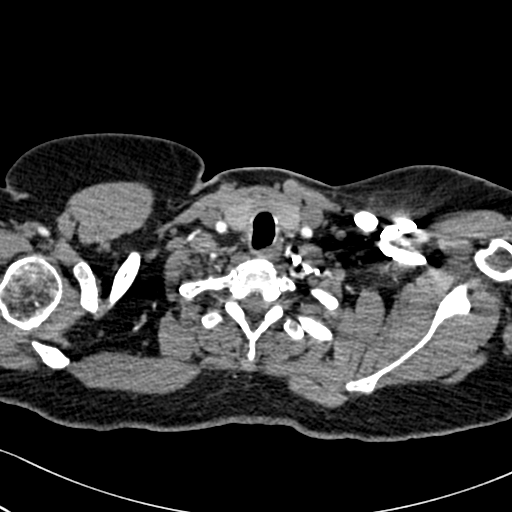

[Series 8: pe coronal mpr · coronal · 0.50mm/px · 1 of 131 slices shown]
[im 66/131  mediastinal]
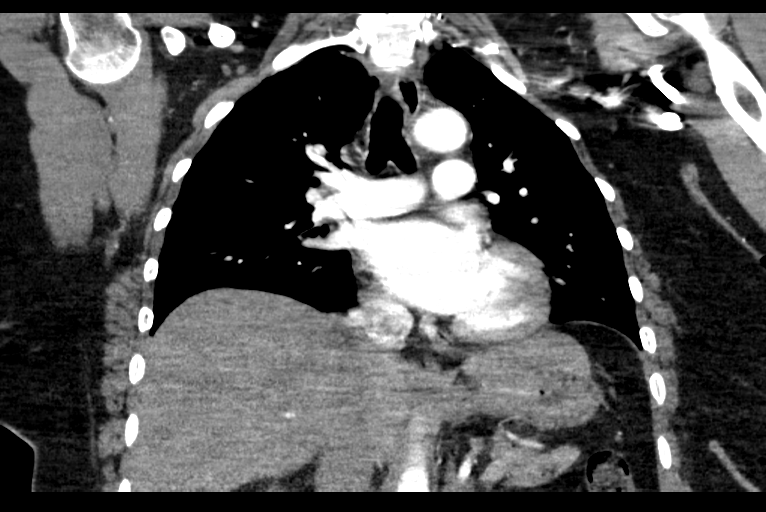

[19 of 36 positions shown; findings below may reference images not displayed]

FINDINGS: Cardiovascular: There is no cardiomegaly or pericardial effusion.
The thoracic aorta is unremarkable. The origins of the great vessels
of the aortic arch appear patent. Evaluation of the pulmonary
arteries is somewhat limited due to respiratory motion artifact. No
pulmonary artery embolus identified.

Mediastinum/Nodes: No hilar or mediastinal adenopathy. The esophagus
is grossly unremarkable. No mediastinal fluid collection.

Lungs/Pleura: There is nodular and streaky density at the left lung
base most consistent with pneumonia. No consolidative changes. There
is no pleural effusion or pneumothorax. The central airways are
patent.

Upper Abdomen: Partially visualized left renal cyst.

Musculoskeletal: No acute osseous pathology

Review of the MIP images confirms the above findings.
IMPRESSION: 1. No CT evidence of pulmonary embolism.
2. Left lower lobe pneumonia.

## 2023-08-30 ENCOUNTER — Ambulatory Visit (HOSPITAL_BASED_OUTPATIENT_CLINIC_OR_DEPARTMENT_OTHER): Admit: 2023-08-30 | Payer: 59

## 2023-08-30 ENCOUNTER — Emergency Department (HOSPITAL_BASED_OUTPATIENT_CLINIC_OR_DEPARTMENT_OTHER)
Admission: EM | Admit: 2023-08-30 | Discharge: 2023-08-30 | Disposition: A | Discharge status: Home / Self Care | Payer: 59 | Attending: Emergency Medicine | Admitting: Emergency Medicine

## 2023-08-30 ENCOUNTER — Encounter (HOSPITAL_BASED_OUTPATIENT_CLINIC_OR_DEPARTMENT_OTHER): Payer: Self-pay | Admitting: Emergency Medicine

## 2023-08-30 ENCOUNTER — Emergency Department (HOSPITAL_BASED_OUTPATIENT_CLINIC_OR_DEPARTMENT_OTHER): Payer: 59

## 2023-08-30 DIAGNOSIS — M109 Gout, unspecified: Secondary | ICD-10-CM

## 2023-08-30 DIAGNOSIS — R252 Cramp and spasm: Secondary | ICD-10-CM | POA: Diagnosis not present

## 2023-08-30 DIAGNOSIS — R091 Pleurisy: Secondary | ICD-10-CM | POA: Diagnosis not present

## 2023-08-30 DIAGNOSIS — R0602 Shortness of breath: Secondary | ICD-10-CM | POA: Insufficient documentation

## 2023-08-30 DIAGNOSIS — Z7901 Long term (current) use of anticoagulants: Secondary | ICD-10-CM | POA: Diagnosis not present

## 2023-08-30 DIAGNOSIS — Z91040 Latex allergy status: Secondary | ICD-10-CM | POA: Insufficient documentation

## 2023-08-30 DIAGNOSIS — Z7982 Long term (current) use of aspirin: Secondary | ICD-10-CM | POA: Insufficient documentation

## 2023-08-30 LAB — I-STAT CHEM 8, ED
BUN: 18 mg/dL (ref 6–20)
Calcium, Ion: 1.24 mmol/L (ref 1.15–1.40)
Chloride: 106 mmol/L (ref 98–111)
Creatinine, Ser: 1.2 mg/dL — ABNORMAL HIGH (ref 0.44–1.00)
Glucose, Bld: 89 mg/dL (ref 70–99)
HCT: 40 % (ref 36.0–46.0)
Hemoglobin: 13.6 g/dL (ref 12.0–15.0)
Potassium: 3.7 mmol/L (ref 3.5–5.1)
Sodium: 141 mmol/L (ref 135–145)
TCO2: 23 mmol/L (ref 22–32)

## 2023-08-30 LAB — CBC WITH DIFFERENTIAL/PLATELET
Abs Immature Granulocytes: 0.01 10*3/uL (ref 0.00–0.07)
Basophils Absolute: 0 10*3/uL (ref 0.0–0.1)
Basophils Relative: 1 %
Eosinophils Absolute: 0.2 10*3/uL (ref 0.0–0.5)
Eosinophils Relative: 3 %
HCT: 38.9 % (ref 36.0–46.0)
Hemoglobin: 12.8 g/dL (ref 12.0–15.0)
Immature Granulocytes: 0 %
Lymphocytes Relative: 47 %
Lymphs Abs: 2.8 10*3/uL (ref 0.7–4.0)
MCH: 31.1 pg (ref 26.0–34.0)
MCHC: 32.9 g/dL (ref 30.0–36.0)
MCV: 94.6 fL (ref 80.0–100.0)
Monocytes Absolute: 0.6 10*3/uL (ref 0.1–1.0)
Monocytes Relative: 9 %
Neutro Abs: 2.4 10*3/uL (ref 1.7–7.7)
Neutrophils Relative %: 40 %
Platelets: 257 10*3/uL (ref 150–400)
RBC: 4.11 MIL/uL (ref 3.87–5.11)
RDW: 11.1 % — ABNORMAL LOW (ref 11.5–15.5)
WBC: 5.9 10*3/uL (ref 4.0–10.5)
nRBC: 0 % (ref 0.0–0.2)

## 2023-08-30 LAB — CK: Total CK: 206 U/L (ref 38–234)

## 2023-08-30 LAB — TROPONIN I (HIGH SENSITIVITY): Troponin I (High Sensitivity): 2 ng/L (ref <18)

## 2023-08-30 MED ORDER — DIPHENHYDRAMINE HCL 25 MG PO CAPS: 50.0000 mg | ORAL_CAPSULE | Freq: Once | ORAL | Status: AC

## 2023-08-30 MED ORDER — METHYLPREDNISOLONE SODIUM SUCC 40 MG IJ SOLR
40.0000 mg | Freq: Once | INTRAMUSCULAR | Status: AC
  Administered 2023-08-30: 40 mg via INTRAVENOUS
  Filled 2023-08-30: qty 1

## 2023-08-30 MED ORDER — DIPHENHYDRAMINE HCL 50 MG/ML IJ SOLN
50.0000 mg | Freq: Once | INTRAMUSCULAR | Status: AC
  Administered 2023-08-30: 50 mg via INTRAVENOUS
  Filled 2023-08-30: qty 1

## 2023-08-30 MED ORDER — IOHEXOL 350 MG/ML SOLN
75.0000 mL | Freq: Once | INTRAVENOUS | Status: AC | PRN
  Administered 2023-08-30: 75 mL via INTRAVENOUS

## 2023-08-30 NOTE — ED Provider Notes (Signed)
[place] [facility]  Provider Note  CSN: [id number] Arrival date & time: 08/30/23 0019  History Chief Complaint  Patient presents with   Shortness of Breath    Veronica Odom is a 47 y.o. 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 She had a recurrence of DVT in March, but CTA was neg for PE. She reports 3 days of bilateral leg aching, and then began having some SOB earlier today she attributed to asthma. She did not get improvement with her inhaler and then began to feel an aching pain in her R upper back and became concerned for another clot.   Home Medications Prior to Admission medications   Medication Sig Start Date End Date Taking? Authorizing Provider  ADVAIR DISKUS 250-50 MCG/DOSE AEPB Inhale 1 puff into the lungs 2 (two) times daily. 05/17/19   [provider]  albuterol (VENTOLIN HFA) 108 (90 Base) MCG/ACT inhaler Inhale 1 puff into the lungs daily as needed for shortness of breath. 12/19/16   [provider]  APIXABAN (ELIQUIS) VTE STARTER PACK (10MG  AND 5MG ) Take as directed on package: start with two-5mg  tablets twice daily for 7 days. On day 8, switch to one-5mg  tablet twice daily. 12/11/22   Melene Plan, DO  aspirin 81 MG chewable tablet Chew by mouth daily.    [provider]  atorvastatin (LIPITOR) 10 MG tablet Take 10 mg by mouth daily.    [provider]  benzonatate (TESSALON) 100 MG capsule Take 1 capsule (100 mg total) by mouth 3 (three) times daily as needed for cough. 09/06/21   Tilden Fossa, MD  cefdinir (OMNICEF) 300 MG capsule Take 1 capsule (300 mg total) by mouth 2 (two) times daily. 09/06/21   Tilden Fossa, MD  diclofenac Sodium (VOLTAREN) 1 % GEL Apply 4 g topically 4 (four) times daily. 07/11/20   Palumbo, April, MD  doxycycline (MONODOX) 100 MG capsule Take 1  capsule (100 mg total) by mouth 2 (two) times daily. 09/06/21   Tilden Fossa, MD  fluticasone [facility]) 50 MCG/ACT nasal spray Place 1 spray into both nostrils daily as needed for allergies or rhinitis.  12/13/16   [provider]  gabapentin (NEURONTIN) 300 MG capsule Take 300 mg by mouth 3 (three) times daily. 07/14/19   [provider]  guaiFENesin-dextromethorphan (ROBITUSSIN DM) 100-10 MG/5ML syrup Take 10 mLs by mouth every 4 (four) hours as needed for cough. 10/16/19   Aquilla Hacker, MD  hydrochlorothiazide (HYDRODIURIL) 12.5 MG tablet Take 12.5 mg by mouth daily. 09/17/19   [provider]  HYDROcodone-acetaminophen (NORCO) 5-325 MG tablet Take 1 tablet by mouth every 6 (six) hours as needed for severe pain. 12/22/21   Molpus, John, MD  lidocaine (LIDODERM) 5 % Place 1 patch onto the skin daily. Remove & Discard patch within 12 hours or as directed by MD 07/11/20   Nicanor Alcon, April, MD  meclizine (ANTIVERT) 25 MG tablet Take 1 tablet (25 mg total) by mouth 3 (three) times daily as needed for dizziness. 12/27/20   Elson Areas, PA-C  methocarbamol (ROBAXIN) 500 MG tablet Take 1 tablet (500 mg total) by mouth 2 (two) times daily. 04/06/21   Mannie Stabile, PA-C  naproxen (NAPROSYN) 375 MG tablet Take 1 tablet twice daily for ankle pain. 01/26/20   Molpus, Jonny Ruiz, MD  ondansetron (ZOFRAN ODT) 4 MG disintegrating tablet Take 1 tablet (4 mg total) by mouth every 8 (eight) hours as needed for nausea or vomiting. 03/01/21   Remo Lipps, MD  oseltamivir (TAMIFLU) 75 MG capsule Take 1 capsule (75 mg total) by mouth every 12 (twelve) hours. 08/27/21   Gloris Manchester, MD  potassium chloride (KLOR-CON) 10 MEQ tablet Take 10 mEq by mouth 2 (two) times daily.    [provider]  rivaroxaban (XARELTO) 10 MG TABS tablet Take 10 mg by mouth daily.    [provider]  senna-docusate (SENOKOT-S) 8.6-50 MG tablet Take 1 tablet by mouth 2 (two) times daily. 10/16/19    Aquilla Hacker, MD  tolterodine (DETROL LA) 2 MG 24 hr capsule Take 2 mg by mouth daily. 06/17/19   [provider]  topiramate (TOPAMAX) 100 MG tablet Take 100 mg by mouth at bedtime. 09/17/19   [provider]  Vitamin D, Ergocalciferol, (DRISDOL) 1.25 MG (50000 UNIT) CAPS capsule Take 50,000 Units by mouth once a week. 10/12/19   [provider]     Allergies    Shellfish-derived products, Ivp dye [iodinated contrast media], Latex, and Penicillins   Review of Systems   Review of Systems Please see HPI for pertinent positives and negatives  Physical Exam BP 124/88   Pulse 87   Temp 98.5 F (36.9 C) (Oral)   Resp 18   Ht 5\' 6"  (1.676 m)   Wt 94.3 kg   LMP 07/05/2018   SpO2 97%   BMI 33.57 kg/m   Physical Exam Vitals and nursing note reviewed.  Constitutional:      Appearance: Normal appearance.  HENT:     Head: Normocephalic and atraumatic.     Nose: Nose normal.     Mouth/Throat:     Mouth: Mucous membranes are moist.  Eyes:     Extraocular Movements: Extraocular movements intact.     Conjunctiva/sclera: Conjunctivae normal.  Cardiovascular:     Rate and Rhythm: Normal rate.     Pulses: Normal pulses.  Pulmonary:     Effort: Pulmonary effort is normal.     Breath sounds: Normal breath sounds. No wheezing or rales.  Abdominal:     General: Abdomen is flat.     Palpations: Abdomen is soft.     Tenderness: There is no abdominal tenderness.  Musculoskeletal:        General: Tenderness (diffuse soft tissue of BLE) present. No swelling. Normal range of motion.     Cervical back: Neck supple.     Right lower leg: No edema.     Left lower leg: No edema.  Skin:    General: Skin is warm and dry.  Neurological:     General: No focal deficit present.     Mental Status: She is alert.  Psychiatric:        Mood and Affect: Mood normal.     ED Results / Procedures / Treatments   EKG EKG Interpretation Date/Time:  Saturday August 30 2023 00:55:07 EST Ventricular Rate:  81 PR Interval:  122 QRS Duration:  80 QT Interval:  342 QTC Calculation: 397 R Axis:   -1  Text Interpretation: Sinus rhythm Low voltage, precordial leads Posterior infarct, old Borderline T abnormalities, inferior leads No significant change since last tracing Confirmed by Susy Frizzle [phone]) on 08/30/2023 1:05:08 AM  Procedures Procedures  Medications Ordered in the ED Medications  methylPREDNISolone sodium succinate (SOLU-MEDROL) 40 mg/mL injection 40 mg (40  mg Intravenous Given 08/30/23 0056)  diphenhydrAMINE (BENADRYL) capsule 50 mg ( Oral See Alternative 08/30/23 0352)    Or  diphenhydrAMINE (BENADRYL) injection 50 mg (50 mg Intravenous Given 08/30/23 0352)  iohexol (OMNIPAQUE) 350 MG/ML injection 75 mL (75 mLs Intravenous Contrast Given 08/30/23 0449)    Initial Impression and Plan  Patient here with symptoms concerning for recurrence of DVT/PE. She reports compliance with Eliquis. Will check labs and CXR. Will need CTA, but has a contrast allergy so will begin pretreatment per protocol.   ED Course   Clinical Course as of 08/30/23 0525  Sat Aug 30, 2023  0102 CBC is normal.  [CS]  0113 I-stat chem and CK are unremarkable.  [CS]  0130 Trop is normal.  [CS]  0142 I personally viewed the images from radiology studies and agree with radiologist interpretation: CXR is clear [CS]  0524 I personally viewed the images from radiology studies and agree with radiologist interpretation: CTA is neg for PE. Given prior DVT, will have patient return for doppler to ensure no new clot while on proper anticoagulation. She is comfortable with that plan.  [CS]    Clinical Course User Index [CS] Pollyann Savoy, MD     MDM Rules/Calculators/A&P Medical Decision Making Problems Addressed: Leg cramping: acute illness or injury Pleurisy: acute illness or injury SOB (shortness of breath): acute illness or injury  Amount and/or Complexity of  Data Reviewed Labs: ordered. Decision-making details documented in ED Course. Radiology: ordered and independent interpretation performed. Decision-making details documented in ED Course. ECG/medicine tests: ordered and independent interpretation performed. Decision-making details documented in ED Course.  Risk Prescription drug management.     Final Clinical Impression(s) / ED Diagnoses Final diagnoses:  SOB (shortness of breath)  Pleurisy  Leg cramping    Rx / DC Orders ED Discharge Orders          Ordered    Lower Ext Bilat Venous US       Comments: IMPORTANT PATIENT INSTRUCTIONS:  You have been scheduled for an Outpatient Ultrasound.    Your appointment has been scheduled for:  _______ am/pm on _______________ (date).  If your appointment is scheduled for a Saturday, Sunday or holiday, please go to the [facility] Emergency Department Registration Desk at least 15 minutes prior to your appointment time and tell them you are there for an ultrasound.    If your appointment is scheduled for a weekday (Monday - Friday), please go directly to the [facility] Radiology Department reception area at least 15 minutes prior to your appointment time and tell them you are there for an ultrasound.  Please call [phone] with questions.   08/30/23 0525             Pollyann Savoy, MD 08/30/23 [phone]

## 2023-08-30 NOTE — ED Notes (Signed)
Patient scheduled for [place] on 08/30/2023 at 1400. Patient made aware and instructions provided. Patient verbalized understanding and denied any questions.

## 2023-08-30 NOTE — ED Triage Notes (Signed)
Pt reports SOB with pain in right ribcage and both legs. SOB all day, leg pain X 3 days

## 2023-09-02 ENCOUNTER — Telehealth (HOSPITAL_BASED_OUTPATIENT_CLINIC_OR_DEPARTMENT_OTHER): Payer: Self-pay

## 2023-12-24 ENCOUNTER — Emergency Department (HOSPITAL_BASED_OUTPATIENT_CLINIC_OR_DEPARTMENT_OTHER)

## 2023-12-24 ENCOUNTER — Emergency Department (HOSPITAL_BASED_OUTPATIENT_CLINIC_OR_DEPARTMENT_OTHER)
Admission: EM | Admit: 2023-12-24 | Discharge: 2023-12-25 | Disposition: A | Discharge status: Home / Self Care | Attending: Emergency Medicine | Admitting: Emergency Medicine

## 2023-12-24 ENCOUNTER — Other Ambulatory Visit: Payer: Self-pay

## 2023-12-24 ENCOUNTER — Encounter (HOSPITAL_BASED_OUTPATIENT_CLINIC_OR_DEPARTMENT_OTHER): Payer: Self-pay | Admitting: Emergency Medicine

## 2023-12-24 DIAGNOSIS — R599 Enlarged lymph nodes, unspecified: Secondary | ICD-10-CM | POA: Insufficient documentation

## 2023-12-24 DIAGNOSIS — Z7982 Long term (current) use of aspirin: Secondary | ICD-10-CM | POA: Insufficient documentation

## 2023-12-24 DIAGNOSIS — Z7901 Long term (current) use of anticoagulants: Secondary | ICD-10-CM | POA: Diagnosis not present

## 2023-12-24 DIAGNOSIS — Z79899 Other long term (current) drug therapy: Secondary | ICD-10-CM | POA: Diagnosis not present

## 2023-12-24 DIAGNOSIS — I10 Essential (primary) hypertension: Secondary | ICD-10-CM | POA: Diagnosis not present

## 2023-12-24 DIAGNOSIS — J45909 Unspecified asthma, uncomplicated: Secondary | ICD-10-CM | POA: Diagnosis not present

## 2023-12-24 DIAGNOSIS — J069 Acute upper respiratory infection, unspecified: Secondary | ICD-10-CM | POA: Insufficient documentation

## 2023-12-24 DIAGNOSIS — L02411 Cutaneous abscess of right axilla: Secondary | ICD-10-CM | POA: Insufficient documentation

## 2023-12-24 DIAGNOSIS — R059 Cough, unspecified: Secondary | ICD-10-CM | POA: Diagnosis present

## 2023-12-24 DIAGNOSIS — Z91040 Latex allergy status: Secondary | ICD-10-CM | POA: Diagnosis not present

## 2023-12-24 NOTE — ED Triage Notes (Signed)
 Pt c/o abscess in right axilla, first noticed this morning. She also notes that she has a cough, fever, SOB with hx asthma and requests covid test. Axilla pain rated 10/10

## 2023-12-25 LAB — RESP PANEL BY RT-PCR (RSV, FLU A&B, COVID)  RVPGX2
Influenza A by PCR: NEGATIVE
Influenza B by PCR: NEGATIVE
Resp Syncytial Virus by PCR: NEGATIVE
SARS Coronavirus 2 by RT PCR: NEGATIVE

## 2023-12-25 MED ORDER — DEXAMETHASONE SODIUM PHOSPHATE 4 MG/ML IJ SOLN
4.0000 mg | Freq: Once | INTRAMUSCULAR | Status: AC
  Administered 2023-12-25: 4 mg via INTRAMUSCULAR
  Filled 2023-12-25: qty 1

## 2023-12-25 MED ORDER — LIDOCAINE HCL URETHRAL/MUCOSAL 2 % EX GEL
1.0000 | Freq: Once | CUTANEOUS | Status: AC
  Administered 2023-12-25: 1 via TOPICAL
  Filled 2023-12-25: qty 11

## 2023-12-25 MED ORDER — DOXYCYCLINE HYCLATE 100 MG PO CAPS: 100.0000 mg | ORAL_CAPSULE | Freq: Two times a day (BID) | ORAL | 0 refills | Status: AC

## 2023-12-25 MED ORDER — DOXYCYCLINE HYCLATE 100 MG PO TABS
100.0000 mg | ORAL_TABLET | Freq: Once | ORAL | Status: AC
  Administered 2023-12-25: 100 mg via ORAL
  Filled 2023-12-25: qty 1

## 2023-12-25 MED ORDER — KETOROLAC TROMETHAMINE 60 MG/2ML IM SOLN
30.0000 mg | Freq: Once | INTRAMUSCULAR | Status: DC
  Filled 2023-12-25: qty 2

## 2023-12-25 NOTE — ED Provider Notes (Signed)
 [place] [facility] Provider Note   CSN: [id number] Arrival date & time: 12/24/23  2314     History  Chief Complaint  Patient presents with   Abscess   URI    Veronica Odom is a 48 y.o. female.  The history is provided by the patient.  Abscess Location:  Shoulder/arm Shoulder/arm abscess location:  R axilla Abscess quality: painful   Abscess quality: not draining, no fluctuance and not weeping   Red streaking: no   Duration:  1 day Progression:  Unchanged Pain details:    Severity:  Severe   Timing:  Constant   Progression:  Unchanged Chronicity:  New Context: not diabetes   Relieved by:  Nothing Worsened by:  Nothing Ineffective treatments: percocet 7.5 mg. URI Presenting symptoms: congestion and cough   Severity:  Moderate Onset quality:  Gradual Duration:  1 day Timing:  Constant Progression:  Unchanged Chronicity:  New Worsened by:  Nothing Associated symptoms: no wheezing   Risk factors: not elderly   Patient with asthma on Eliquis presents with URI symptoms requesting covid test and pain and swelling of right axilla.  Patient has cough and congestion.  She has taken percocet for her symptoms without relief.      Past Medical History:  Diagnosis Date   Asthma    DVT (deep venous thrombosis) (HCC)    GERD (gastroesophageal reflux disease)    HLD (hyperlipidemia)    Hypertension      Home Medications Prior to Admission medications   Medication Sig Start Date End Date Taking? Authorizing Provider  doxycycline (VIBRAMYCIN) 100 MG capsule Take 1 capsule (100 mg total) by mouth 2 (two) times daily. One po bid x 7 days 12/25/23  Yes Aimar Borghi, MD  ADVAIR DISKUS 250-50 MCG/DOSE AEPB Inhale 1 puff into the lungs 2 (two) times daily. 05/17/19   [provider]  albuterol (VENTOLIN HFA) 108 (90 Base) MCG/ACT inhaler Inhale 1 puff into the lungs daily as needed for shortness of breath. 12/19/16   [provider]  APIXABAN Everlene Balls) VTE STARTER PACK (10MG  AND 5MG ) Take as directed on package: start with two-5mg  tablets twice daily for 7 days. On day 8, switch to one-5mg  tablet twice daily. 12/11/22   Melene Plan, DO  aspirin 81 MG chewable tablet Chew by mouth daily.    [provider]  atorvastatin (LIPITOR) 10 MG tablet Take 10 mg by mouth daily.    [provider]  benzonatate (TESSALON) 100 MG capsule Take 1 capsule (100 mg total) by mouth 3 (three) times daily as needed for cough. 09/06/21   Tilden Fossa, MD  cefdinir (OMNICEF) 300 MG capsule Take 1 capsule (300 mg total) by mouth 2 (two) times daily. 09/06/21   Tilden Fossa, MD  diclofenac Sodium (VOLTAREN) 1 % GEL Apply 4 g topically 4 (four) times daily. 07/11/20   Siarah Deleo, MD  doxycycline (MONODOX) 100 MG capsule Take 1 capsule (100 mg total) by mouth 2 (two) times daily. 09/06/21   Tilden Fossa, MD  fluticasone [facility]) 50 MCG/ACT nasal spray Place 1 spray into both nostrils daily as needed for allergies or rhinitis.  12/13/16   [provider]  gabapentin (NEURONTIN) 300 MG capsule Take 300 mg by mouth 3 (three) times daily. 07/14/19   [provider]  guaiFENesin-dextromethorphan (ROBITUSSIN DM) 100-10 MG/5ML syrup Take 10 mLs by mouth every 4 (four) hours as needed for cough. 10/16/19   Aquilla Hacker, MD  hydrochlorothiazide (HYDRODIURIL) 12.5 MG tablet Take 12.5 mg by mouth daily. 09/17/19   [provider]  HYDROcodone-acetaminophen (NORCO) 5-325 MG tablet Take 1 tablet by mouth every 6 (six) hours as needed for severe pain. 12/22/21   Molpus, John, MD  lidocaine (LIDODERM) 5 % Place 1 patch onto the skin daily. Remove & Discard patch within 12 hours or as directed by MD 07/11/20   Nicanor Alcon, Graziella Connery, MD  meclizine (ANTIVERT) 25 MG tablet Take 1 tablet (25 mg total) by mouth 3 (three) times daily as needed for dizziness. 12/27/20   Elson Areas, PA-C  methocarbamol (ROBAXIN)  500 MG tablet Take 1 tablet (500 mg total) by mouth 2 (two) times daily. 04/06/21   Mannie Stabile, PA-C  naproxen (NAPROSYN) 375 MG tablet Take 1 tablet twice daily for ankle pain. 01/26/20   Molpus, John, MD  ondansetron (ZOFRAN ODT) 4 MG disintegrating tablet Take 1 tablet (4 mg total) by mouth every 8 (eight) hours as needed for nausea or vomiting. 03/01/21   Remo Lipps, MD  oseltamivir (TAMIFLU) 75 MG capsule Take 1 capsule (75 mg total) by mouth every 12 (twelve) hours. 08/27/21   Gloris Manchester, MD  potassium chloride (KLOR-CON) 10 MEQ tablet Take 10 mEq by mouth 2 (two) times daily.    [provider]  rivaroxaban (XARELTO) 10 MG TABS tablet Take 10 mg by mouth daily.    [provider]  senna-docusate (SENOKOT-S) 8.6-50 MG tablet Take 1 tablet by mouth 2 (two) times daily. 10/16/19   Aquilla Hacker, MD  tolterodine (DETROL LA) 2 MG 24 hr capsule Take 2 mg by mouth daily. 06/17/19   [provider]  topiramate (TOPAMAX) 100 MG tablet Take 100 mg by mouth at bedtime. 09/17/19   [provider]  Vitamin D, Ergocalciferol, (DRISDOL) 1.25 MG (50000 UNIT) CAPS capsule Take 50,000 Units by mouth once a week. 10/12/19   [provider]      Allergies    Shellfish-derived products, Ivp dye [iodinated contrast media], Latex, and Penicillins    Review of Systems   Review of Systems  Constitutional:  Negative for diaphoresis.  HENT:  Positive for congestion. Negative for facial swelling.   Respiratory:  Positive for cough. Negative for wheezing.   All other systems reviewed and are negative.   Physical Exam Updated Vital Signs BP (!) 139/94 (BP Location: Left Arm)   Pulse (!) 112   Temp 98 F (36.7 C)   Resp 20   Ht 5' 5.5" (1.664 m)   Wt 92.1 kg   LMP 07/05/2018   SpO2 99%   BMI 33.27 kg/m  Physical Exam Vitals and nursing note reviewed.  Constitutional:      General: She is not in acute distress.    Appearance: Normal appearance.  She is well-developed. She is not ill-appearing.  HENT:     Head: Normocephalic and atraumatic.     Nose: Congestion present.  Eyes:     Pupils: Pupils are equal, round, and reactive to light.  Cardiovascular:     Rate and Rhythm: Normal rate and regular rhythm.     Pulses: Normal pulses.     Heart sounds: Normal heart sounds.  Pulmonary:     Effort: Pulmonary effort is normal. No respiratory distress.     Breath sounds: Normal breath sounds. No stridor. No wheezing, rhonchi or rales.  Chest:     Chest wall: No tenderness.    Abdominal:     General:  Bowel sounds are normal. There is no distension.     Palpations: Abdomen is soft.     Tenderness: There is no abdominal tenderness. There is no guarding or rebound.  Musculoskeletal:        General: Normal range of motion.     Cervical back: Normal range of motion and neck supple.  Lymphadenopathy:     Cervical: No cervical adenopathy.  Skin:    Findings: No erythema or rash.  Neurological:     Mental Status: She is alert and oriented to person, place, and time.    ED Results / Procedures / Treatments   Labs (all labs ordered are listed, but only abnormal results are displayed) Labs Reviewed  RESP PANEL BY RT-PCR (RSV, FLU A&B, COVID)  [id number]    EKG None  Radiology DG Chest 2 View Result Date: 12/24/2023 CLINICAL DATA:  Cough since Sunday.  Chest tightness. EXAM: CHEST - 2 VIEW COMPARISON:  08/30/2023 FINDINGS: Normal heart size and pulmonary vascularity. No focal airspace disease or consolidation in the lungs. No blunting of costophrenic angles. No pneumothorax. Mediastinal contours appear intact. Spinal stimulator leads projecting over the lower thoracic region. Surgical clips in the right upper quadrant. IMPRESSION: No active cardiopulmonary disease. Electronically Signed   By: Burman Nieves M.D.   On: 12/24/2023 23:58    Procedures Procedures    Medications Ordered in ED Medications  doxycycline (VIBRA-TABS)  tablet 100 mg (100 mg Oral Given 12/25/23 0036)  lidocaine (XYLOCAINE) 2 % jelly 1 Application (1 Application Topical Given 12/25/23 0036)  dexamethasone (DECADRON) injection 4 mg (4 mg Intramuscular Given 12/25/23 0036)    ED Course/ Medical Decision Making/ A&P                                 Medical Decision Making Patient presents with symptoms of URI x 1 day and swelling of right axilla for same duration.    Amount and/or Complexity of Data Reviewed External Data Reviewed: notes.    Details: Previous notes reviewed  Labs: ordered.    Details: Negative covid, flu, RSV Radiology: ordered and independent interpretation performed.    Details: Normal CXR  Risk Prescription drug management. Risk Details: Patient is well appearing with normal vitals and exam.  Isolated enlarged lymph node in the axilla. Top normal size, freely mobile.  No abscess at this time.  I will start antibiotics (Doxycycline due to allergy profile) and have patient follow up with PMD for ongoing care. URI is viral in nature.  Stable for discharge.      Final Clinical Impression(s) / ED Diagnoses Final diagnoses:  Viral upper respiratory tract infection  Lymph node enlargement  No signs of systemic illness or infection. The patient is nontoxic-appearing on exam and vital signs are within normal limits.  I have reviewed the triage vital signs and the nursing notes. Pertinent labs & imaging results that were available during my care of the patient were reviewed by me and considered in my medical decision making (see chart for details). After history, exam, and medical workup I feel the patient has been appropriately medically screened and is safe for discharge home. Pertinent diagnoses were discussed with the patient. Patient was given return precautions.   Rx / DC Orders ED Discharge Orders          Ordered    doxycycline (VIBRAMYCIN) 100 MG capsule  2 times daily  12/25/23 0007               Derk Doubek, MD 12/25/23 1610

## 2024-09-01 ENCOUNTER — Emergency Department (HOSPITAL_BASED_OUTPATIENT_CLINIC_OR_DEPARTMENT_OTHER)
Admission: EM | Admit: 2024-09-01 | Discharge: 2024-09-01 | Disposition: A | Discharge status: Home / Self Care | Attending: Emergency Medicine | Admitting: Emergency Medicine

## 2024-09-01 ENCOUNTER — Encounter (HOSPITAL_BASED_OUTPATIENT_CLINIC_OR_DEPARTMENT_OTHER): Payer: Self-pay

## 2024-09-01 ENCOUNTER — Other Ambulatory Visit: Payer: Self-pay

## 2024-09-01 DIAGNOSIS — B349 Viral infection, unspecified: Secondary | ICD-10-CM | POA: Diagnosis not present

## 2024-09-01 DIAGNOSIS — Z7951 Long term (current) use of inhaled steroids: Secondary | ICD-10-CM | POA: Diagnosis not present

## 2024-09-01 DIAGNOSIS — Z91040 Latex allergy status: Secondary | ICD-10-CM | POA: Diagnosis not present

## 2024-09-01 DIAGNOSIS — J45909 Unspecified asthma, uncomplicated: Secondary | ICD-10-CM | POA: Diagnosis not present

## 2024-09-01 DIAGNOSIS — Z7901 Long term (current) use of anticoagulants: Secondary | ICD-10-CM | POA: Diagnosis not present

## 2024-09-01 DIAGNOSIS — R519 Headache, unspecified: Secondary | ICD-10-CM

## 2024-09-01 DIAGNOSIS — Z7982 Long term (current) use of aspirin: Secondary | ICD-10-CM | POA: Diagnosis not present

## 2024-09-01 LAB — RESP PANEL BY RT-PCR (RSV, FLU A&B, COVID)  RVPGX2
Influenza A by PCR: NEGATIVE
Influenza B by PCR: NEGATIVE
Resp Syncytial Virus by PCR: NEGATIVE
SARS Coronavirus 2 by RT PCR: NEGATIVE

## 2024-09-01 LAB — URINALYSIS, ROUTINE W REFLEX MICROSCOPIC
Bilirubin Urine: NEGATIVE
Glucose, UA: NEGATIVE mg/dL
Hgb urine dipstick: NEGATIVE
Ketones, ur: NEGATIVE mg/dL
Leukocytes,Ua: NEGATIVE
Nitrite: NEGATIVE
Protein, ur: NEGATIVE mg/dL
Specific Gravity, Urine: 1.025 (ref 1.005–1.030)
pH: 7 (ref 5.0–8.0)

## 2024-09-01 MED ORDER — ONDANSETRON HCL 4 MG/2ML IJ SOLN
4.0000 mg | Freq: Once | INTRAMUSCULAR | Status: AC
  Administered 2024-09-01: 4 mg via INTRAVENOUS
  Filled 2024-09-01: qty 2

## 2024-09-01 MED ORDER — METOCLOPRAMIDE HCL 5 MG/ML IJ SOLN
10.0000 mg | Freq: Once | INTRAMUSCULAR | Status: AC
  Administered 2024-09-01: 10 mg via INTRAVENOUS
  Filled 2024-09-01: qty 2

## 2024-09-01 MED ORDER — ACETAMINOPHEN 500 MG PO TABS
1000.0000 mg | ORAL_TABLET | Freq: Once | ORAL | Status: AC
  Administered 2024-09-01: 1000 mg via ORAL
  Filled 2024-09-01: qty 2

## 2024-09-01 MED ORDER — SODIUM CHLORIDE 0.9 % IV BOLUS
1000.0000 mL | Freq: Once | INTRAVENOUS | Status: AC
  Administered 2024-09-01: 1000 mL via INTRAVENOUS

## 2024-09-01 MED ORDER — ONDANSETRON HCL 4 MG PO TABS: 4.0000 mg | ORAL_TABLET | Freq: Three times a day (TID) | ORAL | 0 refills | Status: AC | PRN

## 2024-09-01 NOTE — ED Notes (Signed)
 Pt needs to use the restroom, removed IV bolus and she ambulated without assistance, albeit she looked uncomfortable.

## 2024-09-01 NOTE — ED Provider Notes (Signed)
 [facility] Provider Note   CSN: [id number] Arrival date & time: 09/01/24  9340     Patient presents with: Generalized Body Aches   Veronica Odom is a 48 y.o. female.  With a history of PE on Eliquis , asthma and migraine headaches who presents to the ED for headache.  Patient first experienced constellation of symptoms including nasal congestion, nausea, generalized bodyaches, headaches and malaise 2 days ago.  She was feeling fine prior to that point.  No identifiable sick contacts but she does work in a workplace with over [employer].  No vomiting diarrhea fevers shaking chills or urinary symptoms.  Denies chest pain shortness of breath.   HPI     Prior to Admission medications   Medication Sig Start Date End Date Taking? Authorizing Provider  ELIQUIS  5 MG TABS tablet Take 5 mg by mouth 2 (two) times daily. 04/05/24  Yes [provider]  ondansetron  (ZOFRAN ) 4 MG tablet Take 1 tablet (4 mg total) by mouth every 8 (eight) hours as needed for nausea or vomiting. 09/01/24  Yes Pamella Sharper A, DO  ADVAIR DISKUS 250-50 MCG/DOSE AEPB Inhale 1 puff into the lungs 2 (two) times daily. 05/17/19   [provider]  albuterol  (VENTOLIN  HFA) 108 (90 Base) MCG/ACT inhaler Inhale 1 puff into the lungs daily as needed for shortness of breath. 12/19/16   [provider]  APIXABAN  (ELIQUIS ) VTE STARTER PACK (10MG  AND 5MG ) Take as directed on package: start with two-5mg  tablets twice daily for 7 days. On day 8, switch to one-5mg  tablet twice daily. 12/11/22   Emil Share, DO  aspirin  81 MG chewable tablet Chew by mouth daily.    [provider]  atorvastatin (LIPITOR) 10 MG tablet Take 10 mg by mouth daily.    [provider]  atorvastatin (LIPITOR) 20 MG tablet Take 20 mg by mouth at bedtime.    [provider]  benzonatate  (TESSALON ) 100 MG capsule Take 1 capsule (100 mg total) by mouth 3 (three) times daily  as needed for cough. 09/06/21   Griselda Norris, MD  cefdinir  (OMNICEF ) 300 MG capsule Take 1 capsule (300 mg total) by mouth 2 (two) times daily. 09/06/21   Griselda Norris, MD  diclofenac  Sodium (VOLTAREN ) 1 % GEL Apply 4 g topically 4 (four) times daily. 07/11/20   Palumbo, April, MD  doxycycline  (MONODOX ) 100 MG capsule Take 1 capsule (100 mg total) by mouth 2 (two) times daily. 09/06/21   Griselda Norris, MD  doxycycline  (VIBRAMYCIN ) 100 MG capsule Take 1 capsule (100 mg total) by mouth 2 (two) times daily. One po bid x 7 days 12/25/23   Palumbo, April, MD  fexofenadine (ALLEGRA) 180 MG tablet Take 180 mg by mouth daily as needed.    [provider]  fluticasone  (FLONASE ) 50 MCG/ACT nasal spray Place 1 spray into both nostrils daily as needed for allergies or rhinitis.  12/13/16   [provider]  gabapentin  (NEURONTIN ) 300 MG capsule Take 300 mg by mouth 3 (three) times daily. 07/14/19   [provider]  guaiFENesin -dextromethorphan  (ROBITUSSIN DM) 100-10 MG/5ML syrup Take 10 mLs by mouth every 4 (four) hours as needed for cough. 10/16/19   Ajuonuma, Felix O, MD  hydrochlorothiazide (HYDRODIURIL) 12.5 MG tablet Take 12.5 mg by mouth daily. 09/17/19   [provider]  HYDROcodone -acetaminophen  (NORCO) 5-325 MG tablet Take 1 tablet by mouth every 6 (six) hours as needed for severe pain. 12/22/21   Molpus,  John, MD  lidocaine  (LIDODERM ) 5 % Place 1 patch onto the skin daily. Remove & Discard patch within 12 hours or as directed by MD 07/11/20   Palumbo, April, MD  meclizine  (ANTIVERT ) 25 MG tablet Take 1 tablet (25 mg total) by mouth 3 (three) times daily as needed for dizziness. 12/27/20   Sofia, Leslie K, PA-C  methocarbamol  (ROBAXIN ) 500 MG tablet Take 1 tablet (500 mg total) by mouth 2 (two) times daily. 04/06/21   Aberman, Caroline C, PA-C  naproxen  (NAPROSYN ) 375 MG tablet Take 1 tablet twice daily for ankle pain. 01/26/20   Molpus, Norleen, MD  ondansetron  (ZOFRAN  ODT)  4 MG disintegrating tablet Take 1 tablet (4 mg total) by mouth every 8 (eight) hours as needed for nausea or vomiting. 03/01/21   Laurence Fonda GRADE, MD  oseltamivir  (TAMIFLU ) 75 MG capsule Take 1 capsule (75 mg total) by mouth every 12 (twelve) hours. 08/27/21   Melvenia Motto, MD  potassium chloride  (KLOR-CON ) 10 MEQ tablet Take 10 mEq by mouth 2 (two) times daily.    [provider]  potassium chloride  (KLOR-CON ) 10 MEQ tablet Take 10 mEq by mouth daily.    [provider]  rivaroxaban (XARELTO) 10 MG TABS tablet Take 10 mg by mouth daily.    [provider]  senna-docusate (SENOKOT-S) 8.6-50 MG tablet Take 1 tablet by mouth 2 (two) times daily. 10/16/19   Ajuonuma, Felix O, MD  tolterodine (DETROL LA) 2 MG 24 hr capsule Take 2 mg by mouth daily. 06/17/19   [provider]  topiramate  (TOPAMAX ) 100 MG tablet Take 100 mg by mouth at bedtime. 09/17/19   [provider]  Vitamin D , Ergocalciferol , (DRISDOL ) 1.25 MG (50000 UNIT) CAPS capsule Take 50,000 Units by mouth once a week. 10/12/19   [provider]    Allergies: Shellfish protein-containing drug products, Ivp dye [iodinated contrast media], Latex, and Penicillins    Review of Systems  Updated Vital Signs BP (!) 135/94   Pulse 88   Temp 98.2 F (36.8 C) (Oral)   Resp 18   LMP 07/05/2018   SpO2 98%   Physical Exam Vitals and nursing note reviewed.  HENT:     Head: Normocephalic and atraumatic.  Eyes:     Pupils: Pupils are equal, round, and reactive to light.  Cardiovascular:     Rate and Rhythm: Normal rate and regular rhythm.  Pulmonary:     Effort: Pulmonary effort is normal.     Breath sounds: Normal breath sounds.  Abdominal:     Palpations: Abdomen is soft.     Tenderness: There is no abdominal tenderness.  Skin:    General: Skin is warm and dry.  Neurological:     General: No focal deficit present.     Mental Status: She is alert.  Psychiatric:        Mood and Affect:  Mood normal.     (all labs ordered are listed, but only abnormal results are displayed) Labs Reviewed  RESP PANEL BY RT-PCR (RSV, FLU A&B, COVID)  [id number]  URINALYSIS, ROUTINE W REFLEX MICROSCOPIC    EKG: None  Radiology: No results found.   Procedures   Medications Ordered in the ED  sodium chloride  0.9 % bolus 1,000 mL (0 mLs Intravenous Stopped 09/01/24 0954)  ondansetron  (ZOFRAN ) injection 4 mg (4 mg Intravenous Given 09/01/24 0845)  metoCLOPramide  (REGLAN ) injection 10 mg (10 mg Intravenous Given 09/01/24 0847)  acetaminophen  (TYLENOL ) tablet 1,000 mg (1,000 mg Oral Given  09/01/24 0850)    Clinical Course as of 09/01/24 1009  Wed Sep 01, 2024  0755 COVID flu RSV all negative [MP]  1007 UA negative.  Patient feeling much better after Reglan  and Zofran  fluids here.  Will discharge with Zofran  prescription.  Counseled her on symptomatic management of likely viral illness [MP]    Clinical Course User Index [MP] Pamella Ozell LABOR, DO                                 Medical Decision Making 48 year old female with history as above presenting for viral syndrome headaches for the last 2 days.  Afebrile well-appearing overall but reporting migraine headache.  Has had migraines in the past.  Compliant with Eliquis  for history of PE.  No fevers tachycardia shortness of breath.  Suspect inciting viral syndrome that triggered migraine headache.  Will treat for migraine here with fluids Zofran  Reglan  and acetaminophen  and obtain viral respiratory swab to evaluate for viral illness.  Amount and/or Complexity of Data Reviewed Labs: ordered.  Risk OTC drugs. Prescription drug management.        Final diagnoses:  Viral syndrome  Acute nonintractable headache, unspecified headache type    ED Discharge Orders          Ordered    ondansetron  (ZOFRAN ) 4 MG tablet  Every 8 hours PRN        09/01/24 1008               Pamella Ozell A, DO 09/01/24 1009

## 2024-09-01 NOTE — ED Triage Notes (Signed)
 Bodyaches, headache, nausea, cough, congestion for 2 days.

## 2024-09-01 NOTE — Discharge Instructions (Signed)
 You were seen in the emergency department for headache body aches and congestion Your symptoms are most likely being caused by a viral illness Your headache improved after several medications here We have called in a prescription for Zofran  for you to pick up from your pharmacy and begin taking as directed for nausea and vomiting Keep well-hydrated Take Tylenol  as directed for pains and fevers Follow-up with your primary doctor within 1 week for reevaluation Return to the emergency department for severe headaches trouble breathing or any other concerns
